# Patient Record
Sex: Female | Born: 1935
Health system: Southern US, Community
[De-identification: ages and names within clinical notes are randomized; demographics above are authoritative.]

## PROBLEM LIST (undated history)

## (undated) DIAGNOSIS — I10 Essential (primary) hypertension: Secondary | ICD-10-CM

## (undated) DIAGNOSIS — K219 Gastro-esophageal reflux disease without esophagitis: Secondary | ICD-10-CM

## (undated) DIAGNOSIS — E785 Hyperlipidemia, unspecified: Secondary | ICD-10-CM

## (undated) DIAGNOSIS — K449 Diaphragmatic hernia without obstruction or gangrene: Secondary | ICD-10-CM

## (undated) DIAGNOSIS — M1990 Unspecified osteoarthritis, unspecified site: Secondary | ICD-10-CM

## (undated) DIAGNOSIS — Z8719 Personal history of other diseases of the digestive system: Secondary | ICD-10-CM

## (undated) DIAGNOSIS — D649 Anemia, unspecified: Secondary | ICD-10-CM

## (undated) DIAGNOSIS — C801 Malignant (primary) neoplasm, unspecified: Secondary | ICD-10-CM

## (undated) DIAGNOSIS — Z9889 Other specified postprocedural states: Secondary | ICD-10-CM

## (undated) DIAGNOSIS — R413 Other amnesia: Secondary | ICD-10-CM

## (undated) HISTORY — PX: COLONOSCOPY: SHX174

## (undated) HISTORY — DX: Anemia, unspecified: D64.9

## (undated) HISTORY — DX: Unspecified osteoarthritis, unspecified site: M19.90

## (undated) HISTORY — DX: Gastro-esophageal reflux disease without esophagitis: K21.9

## (undated) HISTORY — DX: Personal history of other diseases of the digestive system: Z87.19

## (undated) HISTORY — DX: Essential (primary) hypertension: I10

## (undated) HISTORY — DX: Diaphragmatic hernia without obstruction or gangrene: K44.9

## (undated) HISTORY — DX: Hyperlipidemia, unspecified: E78.5

## (undated) HISTORY — DX: Other specified postprocedural states: Z98.890

---

## 1998-11-17 ENCOUNTER — Emergency Department (HOSPITAL_COMMUNITY): Admission: EM | Admit: 1998-11-17 | Discharge: 1998-11-17 | Payer: Self-pay

## 2005-06-30 ENCOUNTER — Inpatient Hospital Stay (HOSPITAL_COMMUNITY): Admission: AD | Admit: 2005-06-30 | Discharge: 2005-07-03 | Payer: Self-pay | Admitting: Internal Medicine

## 2005-07-02 ENCOUNTER — Encounter (INDEPENDENT_AMBULATORY_CARE_PROVIDER_SITE_OTHER): Payer: Self-pay | Admitting: *Deleted

## 2008-12-23 ENCOUNTER — Emergency Department (HOSPITAL_COMMUNITY): Admission: EM | Admit: 2008-12-23 | Discharge: 2008-12-23 | Payer: Self-pay | Admitting: Emergency Medicine

## 2009-12-11 ENCOUNTER — Encounter: Admission: RE | Admit: 2009-12-11 | Discharge: 2009-12-11 | Payer: Self-pay | Admitting: Family Medicine

## 2011-04-09 NOTE — H&P (Signed)
NAME:  [name], [name]                   ACCOUNT NO.:  [id number]   MEDICAL RECORD NO.:  [id number]          PATIENT TYPE:  INP   LOCATION:  6739                         FACILITY:  [facility]   PHYSICIAN:  Jackie Plum, M.D.DATE OF BIRTH:  08-Dec-1931   DATE OF ADMISSION:  06/30/2005  DATE OF DISCHARGE:                                HISTORY & PHYSICAL   PRIMARY CARE PHYSICIAN:  Tally Joe, M.D. of [facility].   CHIEF COMPLAINT:  Hematochezia.   HISTORY OF PRESENT ILLNESS:  The patient is a 77 year old African American  lady with a history of hypertension and dyslipidemia on Maxzide 37.5/25 one  tablet daily, aspirin 81 mg daily, Lipitor 40 mg daily.  She went to her  primary care physician's office today because of hematochezia.  According to  the patient, she had been in her usual state of health until three days ago,  Monday, June 28, 2005, when she noted that she was constipated.  She took  an unknown laxative and started to open her bowels some the next day.  Around 5 this morning, she woke up with some cramping, low abdominal pain,  and she went to the bathroom to ease herself and that is when she realized  that she had some bloody diarrhea without any mucus.  She said that she had  more than about 50 to 60 cc of bright red blood mixed with her liquids  diarrheal stools.  This recurred on three occasions and therefore, she came  to see her PCP, Dr. Azucena Cecil in the office.  According Dr. Azucena Cecil, who I spoke  to by the phone, he performed anoscopy, which did not reveal any hemorrhoids  and it showed a pool of clotted blood.  Her blood pressure, according to Dr.  Azucena Cecil, was stable without any hypotension or tachycardia and apparently no  blood work was obtained.  The patient was referred to the hospitalist  service for admission and management.  The patient admits to lightheadedness  earlier which is better now without any chest pain or shortness of breath,  sputum  production, cough, fever, chills, PND, orthopnea, palpitations,  visual changes.  She denies any dysuria or frequency of micturition.  She  does not have any abdominal pain at this moment.  She has not had any  diarrheal episodes or any episode of hematochezia since leaving her house to  go to the doctor's office today.  No fever, no chills, no heat or cold  intolerance, no weight loss or weight gain.   PAST MEDICAL HISTORY:  Please see HPI above.  The patient denies any history  of diabetes or heart disease.   MEDICATION HISTORY:  Please see HPI above.   ALLERGIES:  The patient denies any known medication allergies.   FAMILY HISTORY:  Positive for heart disease in her brother who had a heart  attack, age of the brother at the time of the heart attack is unclear.  No  history of bowel cancers or bowel illnesses in the family is noted.   SOCIAL HISTORY:  The patient lives with  her husband.  She does not smoke  cigarettes, drinks alcohol occasionally according to her on a social basis  only.   REVIEW OF SYSTEMS:  Ten-step systemic inquiry review is as stated under HPI,  otherwise  unremarkable.   PHYSICAL EXAMINATION:  VITAL SIGNS:  As yet to be taken, it will be  reviewed.  GENERAL:  She is not in any acute distress.  HEENT:  Normocephalic, atraumatic.  Pupils are equal, round, and reactive to  light.  Extraocular movements intact.  Oropharynx moist.  No exudation or  erythema.  NECK:  Supple.  No JVD.  LUNGS:  Clear to auscultation.  CARDIAC:  Regular rate and rhythm.  No gallops or murmur.  ABDOMEN:  Mild to moderately obese.  Soft, nontender.  Bowel sounds present.  EXTREMITIES:  No cyanosis and no edema.  CNS:  Nonfocal.   The patient came with blood work done on January 10, 2002, which was  reviewed.  No recent blood work came with her.  She also came with complete  physical exam  done on January 17, 2002.  This was also reviewed.   IMPRESSION:  1.  Lower  gastrointestinal bleed.  2.  Hypertension.  3.  Dyslipidemia.   PLAN:  1.  The patient will be admitted.  2.  We will run a panel of CBC, complete metabolic panel, coagulation panel,      x-rays, and a 12-lead EKG for completeness sake.  3.  We will consult GI.  The patient has never had any colonoscopy before      for possible inpatient colonoscopy.  However, if on observation she does      not have any evidence of acute bleed and her hematocrit has not dropped      significantly, this may be done at the outpatient level.       GO/MEDQ  D:  06/30/2005  T:  06/30/2005  Job:  [id number]   cc:   Tally Joe, M.D.  Fax: [phone]   Bernette Redbird, M.D.  [address]  [place], [place] [address]  Fax: [phone]

## 2011-04-09 NOTE — Op Note (Signed)
NAME:  [name], [name]                   ACCOUNT NO.:  [id number]   MEDICAL RECORD NO.:  [id number]          PATIENT TYPE:  INP   LOCATION:  6739                         FACILITY:  [facility]   PHYSICIAN:  Bernette Redbird, M.D.   DATE OF BIRTH:  30-Dec-1931   DATE OF PROCEDURE:  07/02/2005  DATE OF DISCHARGE:                                 OPERATIVE REPORT   PROCEDURE:  Upper endoscopy with biopsies.   INDICATIONS:  A 77 year old female who presented to the hospital with  burgundy stools that became melenic in character, hemoglobin around 9,  without frank syncope, in the setting of outpatient aspirin in [facility] exposure.   FINDINGS:  Small ulcer plus erosions in the antrum of the stomach.   PROCEDURE:  The nature, purpose, risks and alternatives of the procedure  have been discussed with the patient who provided written consent and was  brought in a fasted state from her hospital room to the endoscopy unit where  topical pharyngeal anesthesia was followed by intravenous sedation with  fentanyl 35 mcg and Versed 5 milligrams IV. She developed some sinus  tachycardia around 120 while she was coughing during the procedure and  during that time her O2 sat did drop to about 79%. This may have been just  due to the severe coughing or may have been spurious but in any event, after  repositioning her head, stopping suctioning and thereby reducing coughing  and gagging, the O2 sat promptly rose back to 96%.   The Olympus small-caliber adult video endoscope was used for this procedure  was passed under direct vision. The larynx was briefly seen and appeared  grossly normal although a detailed examination was not achieved. The  esophagus had normal mucosa but a well-defined esophageal ring at the  squamocolumnar junction, below which was a roughly 2-3 cm hiatal hernia. The  esophagus showed no evidence of Mallory-Weiss tear, reflux esophagitis,  Barrett's esophagus, varices, infection or neoplasia.   The  stomach contained no blood or coffee-ground material. There was a small  clear residual that was suctioned up.   On the anterior wall of the antrum was a roughly 5-6 mm moderately deep  ulcer with a fairly clean base but an erythematous rim, and in the more  prepyloric region there were a couple of erosions. There was certainly no  visible vessel or adherent clot. The pylorus, duodenal bulb and second  duodenum looked normal.   Prior to removal of the scope, I obtained antral biopsies looking for  evidence of H. pylori infection. The patient tolerated the procedure well  and there were no apparent complications.   IMPRESSION:  1.  Gastric ulcer accounting for the patient's recent gastrointestinal      hemorrhage (531.00).  2.  No evidence of active bleeding at the time of this exam.  3.  Small hiatal hernia with esophageal ring which is asymptomatic.  4.  Gastric erosions, with the overall picture suggestive of aspirin induced      gastropathy.   PLAN:  1.  The patient will have her diet advanced and  she will probably be okay      for discharge if her hemoglobin remains stable.  2.  Await pathology on biopsies. If H. pylori is present, I would favor      treating it.  3.  The patient should remain off aspirin for a month, at which time she may      resume it as [name] as she remains on daily proton pump inhibitor therapy  4.  The patient should have follow-up endoscopy in two months to confirm      ulcer healing and at that same time, a screening colonoscopy could be      performed.  5.  The patient should follow-up with her primary physician in the office      the next week or so, at which time a stool Hemoccult, CBC, and clinical      assessment could be obtained and she could be started on iron      supplementation to help rebuild her blood count.       RB/MEDQ  D:  07/02/2005  T:  07/03/2005  Job:  [id number]   cc:   Tally Joe, M.D.  Fax: [phone]

## 2011-04-09 NOTE — Consult Note (Signed)
NAME:  [name], [name]                   ACCOUNT NO.:  [id number]   MEDICAL RECORD NO.:  [id number]          PATIENT TYPE:  INP   LOCATION:  6739                         FACILITY:  [facility]   PHYSICIAN:  Bernette Redbird, M.D.   DATE OF BIRTH:  28-Nov-1931   DATE OF CONSULTATION:  07/02/2005  DATE OF DISCHARGE:                                   CONSULTATION   REFERRING PHYSICIAN:  [name], M.D.   Dr. Derenda Mis of the [facility]ists asked [place] to see this 77-year-  old female because of GI bleeding.   Ms. Schmaltz was admitted to the hospital two days ago with a history of  burgundy stools that have subsequently become darker, in association with  daily use of an 81 milligrams aspirin plus an average of one BC powder  daily. No prior history of ulcers or GI bleeding. She did have some  prodromal dyspeptic heartburn-type symptoms for which she uses Tums. No  syncope, perhaps some weakness or dizziness.   Her hemoglobin since admission has held up fairly well and she has not  required a transfusion. Her current hemoglobin of 9.2 has really been stable  for the past couple of days. Her BUN was normal at the time of admission.   With that background, I was asked to see the patient.   PAST MEDICAL HISTORY:  As obtained from the old chart, no known allergies.   OUTPATIENT MEDICATIONS:  Maxzide, Lipitor, Mucinex, daily 81 milligrams  aspirin, and BC powders.   OPERATIONS:  None.   HABITS:  Occasional ethanol, nonsmoker.   FAMILY HISTORY:  Negative for GI illnesses.   SOCIAL HISTORY:  Lives with her husband.   REVIEW OF SYSTEMS:  Negative for chronic GI symptoms other than some degree  of heartburn; specifically no dysphagia, anorexia, weight loss, abdominal  pain, nausea, vomiting, constipation, or diarrhea.   PHYSICAL EXAMINATION:  GENERAL:  A pleasant African-American female in no  evident distress; neither anxious nor depressed.  HEENT:  Anicteric. No overt pallor.  Oropharynx benign.  CHEST:  Clear.  HEART:  Normal.  ABDOMEN:  Without organomegaly, guarding, mass or tenderness.   LABORATORY DATA:  Admission hemoglobin was 9.1 with an MCV of 72 and a  slightly elevated RDW of 14.8, platelets 196,000. INR 1.1, BUN was 11 on  admission. Liver chemistries normal. Albumin 3.2.   IMPRESSION:  Recent gastrointestinal bleed, probably of upper tract origin:  In view of her risk factors and the fact that her stool became melenic in  character, the normal BUN on admission raises the question as to whether the  bleeding may have stopped prior to admission or whether it could be from a  lower tract source.   PLAN:  Endoscopic evaluation later today. The nature, purpose, risks,  alternatives were reviewed with the patient and she is agreeable to proceed,  with further management to depend on the endoscopic findings.       RB/MEDQ  D:  07/02/2005  T:  07/03/2005  Job:  [id number]   cc:   Tally Joe, M.D.  Fax: [phone]

## 2011-04-09 NOTE — Discharge Summary (Signed)
NAME:  [name], [name]                   ACCOUNT NO.:  [id number]   MEDICAL RECORD NO.:  [id number]          PATIENT TYPE:  INP   LOCATION:  6739                         FACILITY:  [facility]   PHYSICIAN:  [name] L. Ladona Ridgel, MD  DATE OF BIRTH:  06/08/1932   DATE OF ADMISSION:  06/30/2005  DATE OF DISCHARGE:  07/03/2005                                 DISCHARGE SUMMARY   DISCHARGE DIAGNOSES:  1.  Melena. The patient was admitted to the hospital and underwent      esophagogastroduodenoscopy where she was found to have a gastric ulcer,      nonbleeding. She was treated with proton pump inhibitor which should be      continued at home. She will follow up as an outpatient with the [facility] GI      for repeat esophagogastroduodenoscopy in several weeks as well as I      colonoscopy.  2.  Hypertension.  She will continue on her triamterene/hydrochlorothiazide.  3.  Dyslipidemia. She will continue on her Lipitor.  4.  Chronic cough. The patient has been taking Mucinex at home that she      started on her own.  The  symptomatology and exam are consistent with      probable reflux and sinusitis. I have requested that she follow up with      Dr. Azucena Cecil if the symptoms persist.   MEDICATIONS AT THE TIME OF DISCHARGE:  1.  Lipitor 40 mg p.o. q.h.s.  2.  Triamterene/hydrochlorothiazide 1 tablet p.o. daily.  I assume this is      37.5/25.  3.  Guaifenesin 600 mg b.i.d.  4.  Protonix 40 mg daily.   The patient was encouraged to avoid the [facility] Powders, aspirin, Motrin, Aleve,  anything in that related family and she was to use Tylenol for pain.  She  was instructed verbally on the diet that she should maintain namely avoiding  alcohol, caffeine, and spicy foods until she sees Dr. Matthias Hughs again.   Follow up with Dr. Mariann Laster at 1:00 p.m.Marland Kitchen She is to follow up  with Dr. Azucena Cecil next week to check her blood levels.   HISTORY OF PRESENT ILLNESS:  The patient is a very pleasant 77 year old  African-American female who presented to the emergency room after being seen  in her primary care physician's office with a complaint of hematochezia.  According to the patient she was in her usual state of health until  August7,2006 when she noticed some constipation. She took a laxative and the  next morning around 5:00 a.m. was awoken with crampy abdominal pain. She  went to bathroom and realize that the diarrhea with bloody with some mucus.  She said she had about 50-60 mL of bright red blood mixed with her stool.  She said this recurred a couple of times and so she went to Dr. Merita Norton  office who sent her to the emergency room. In the ED, her blood pressure was  stable. She was not tachycardiac and her hemoglobin/hematocrit were stable  at 9.1 and 28.1 respectively. The patient was  admitted to the telemetry  floor. She it was evaluated with serial H&H's for a further decrease in her  hemoglobin. The patient was then evaluated by GI who recommended to go in  for a EGD. The patient had been prepared the night before for potential EGD  and therefore was taken later in the day on Friday. The EGD showed a 5 mm  ulcer in the gastric body without bleeding. The patient was return to her  room, continued on a proton pump inhibitor, and monitored overnight. During  the course of hospital stay she had maybe one to two other bowel movements  that were more melanotic than hematochezia. She tolerated p.o. diet and  therefore with hemodynamic instability and no change in her hemoglobin and  hematocrit she was discharged to home on Saturday, August12.   PERTINENT LABORATORY VALUES:  During the course of hospital stay on the day  of discharge her sodium was 140, potassium of 3.2 and was repleted orally,  her glucose was 101, BUN was 9, creatinine was 0.6. Her CBC showed white  count of 6.6, hemoglobin of 8.9 and 28 which was not much different from the  9.2 and 28.8 of the day before.  It was  therefore recommended that she  follow up with Dr. Azucena Cecil for further hemoglobin and have  hematocrit  checked this week. Other pertinent labs, her PTT was 29. PT was 1.1.  Urinalysis was negative. Portable chest x-ray was completed on August9,which  showed no acute disease.   On the day of discharge the patient was deemed hemodynamically stable for  follow-up with Dr. Azucena Cecil who should evaluate her hemoglobin and hematocrit.  She was given appropriate orders for waiting [facility] Powders, aspirin and  medications that would affect her abdomen and she will follow up with Dr.  Matthias Hughs on [place].   CONDITION AT THE TIME OF DISCHARGE:  Stable.      [name] L. Ladona Ridgel, MD  Electronically Signed     MLT/MEDQ  D:  07/05/2005  T:  07/05/2005  Job:  [id number]   cc:   Bernette Redbird, M.D.  [address] [place]  [place], [place] [address]  Fax: [phone]   Tally Joe, M.D.  Fax: [phone]

## 2011-07-28 ENCOUNTER — Emergency Department (HOSPITAL_COMMUNITY): Payer: Medicare Other

## 2011-07-28 ENCOUNTER — Emergency Department (HOSPITAL_COMMUNITY)
Admission: EM | Admit: 2011-07-28 | Discharge: 2011-07-29 | Disposition: A | Discharge status: Home / Self Care | Payer: Medicare Other | Attending: Emergency Medicine | Admitting: Emergency Medicine

## 2011-07-28 DIAGNOSIS — E789 Disorder of lipoprotein metabolism, unspecified: Secondary | ICD-10-CM | POA: Insufficient documentation

## 2011-07-28 DIAGNOSIS — M25469 Effusion, unspecified knee: Secondary | ICD-10-CM | POA: Insufficient documentation

## 2011-07-28 DIAGNOSIS — K219 Gastro-esophageal reflux disease without esophagitis: Secondary | ICD-10-CM | POA: Insufficient documentation

## 2011-07-28 DIAGNOSIS — M25569 Pain in unspecified knee: Secondary | ICD-10-CM | POA: Insufficient documentation

## 2011-07-28 DIAGNOSIS — I10 Essential (primary) hypertension: Secondary | ICD-10-CM | POA: Insufficient documentation

## 2011-09-20 ENCOUNTER — Encounter (INDEPENDENT_AMBULATORY_CARE_PROVIDER_SITE_OTHER): Payer: Self-pay | Admitting: General Surgery

## 2011-10-04 ENCOUNTER — Ambulatory Visit (INDEPENDENT_AMBULATORY_CARE_PROVIDER_SITE_OTHER): Payer: Medicare Other | Admitting: General Surgery

## 2011-10-04 ENCOUNTER — Encounter (INDEPENDENT_AMBULATORY_CARE_PROVIDER_SITE_OTHER): Payer: Self-pay | Admitting: General Surgery

## 2011-10-04 VITALS — BP 144/78 | HR 60 | Temp 97.8°F | Resp 18 | Ht 63.0 in | Wt 140.1 lb

## 2011-10-04 DIAGNOSIS — K4020 Bilateral inguinal hernia, without obstruction or gangrene, not specified as recurrent: Secondary | ICD-10-CM

## 2011-10-04 NOTE — Patient Instructions (Signed)
You have bilateral inguinal hernias. You have decided to proceed with scheduling of repair of the bilateral inguinal hernias, and we are going to try to do this laparoscopically. We will schedule the surgery in the near future at your convenience.  Hernia Repair with Laparoscope A hernia occurs when an internal organ pushes out through a weak spot in the belly (abdominal) wall muscles. Hernias most commonly occur in the groin and around the navel. Hernias can also occur through a cut by the surgeon (incision) after an abdominal operation. A hernia may be caused by:  Lifting heavy objects.   Prolonged coughing.   Straining to move your bowels.  Hernias can often be pushed back into place (reduced). Most hernias tend to get worse over time. Problems occur when abdominal contents get stuck in the opening and the blood supply is blocked or impaired (incarcerated hernia). Because of these risks, you require surgery to repair the hernia. Your hernia will be repaired using a laparoscope. Laparoscopic surgery is a type of minimally invasive surgery. It does not involve making a typical surgical cut (incision) in the skin. A laparoscope is a telescope-like rod and lens system. It is usually connected to a video camera and a light source so your caregiver can clearly see the operative area. The instruments are inserted through  to  inch (5 mm or 10 mm) openings in the skin at specific locations. A working and viewing space is created by blowing a small amount of carbon dioxide gas into the abdominal cavity. The abdomen is essentially blown up like a balloon (insufflated). This elevates the abdominal wall above the internal organs like a dome. The carbon dioxide gas is common to the human body and can be absorbed by tissue and removed by the respiratory system. Once the repair is completed, the small incisions will be closed with either stitches (sutures) or staples (just like a paper stapler only this staple  holds the skin together). LET YOUR CAREGIVERS KNOW ABOUT:  Allergies.   Medications taken including herbs, eye drops, over the counter medications, and creams.   Use of steroids (by mouth or creams).   Previous problems with anesthetics or Novocaine.   Possibility of pregnancy, if this applies.   History of blood clots (thrombophlebitis).   History of bleeding or blood problems.   Previous surgery.   Other health problems.  BEFORE THE PROCEDURE  Laparoscopy can be done either in a hospital or out-patient clinic. You may be given a mild sedative to help you relax before the procedure. Once in the operating room, you will be given a general anesthesia to make you sleep (unless you and your caregiver choose a different anesthetic).  AFTER THE PROCEDURE  After the procedure you will be watched in a recovery area. Depending on what type of hernia was repaired, you might be admitted to the hospital or you might go home the same day. With this procedure you may have less pain and scarring. This usually results in a quicker recovery and less risk of infection. HOME CARE INSTRUCTIONS   Bed rest is not required. You may continue your normal activities but avoid heavy lifting (more than 10 pounds) or straining.   Cough gently. If you are a smoker it is best to stop, as even the best hernia repair can break down with the continual strain of coughing.   Avoid driving until given the OK by your surgeon.   There are no dietary restrictions unless given otherwise.  TAKE ALL MEDICATIONS AS DIRECTED.   Only take over-the-counter or prescription medicines for pain, discomfort, or fever as directed by your caregiver.  SEEK MEDICAL CARE IF:   There is increasing abdominal pain or pain in your incisions.   There is more bleeding from incisions, other than minimal spotting.   You feel light headed or faint.   You develop an unexplained fever, chills, and/or an oral temperature above 102 F  (38.9 C).   You have redness, swelling, or increasing pain in the wound.   Pus coming from wound.   A foul smell coming from the wound or dressings.  SEEK IMMEDIATE MEDICAL CARE IF:   You develop a rash.   You have difficulty breathing.   You have any allergic problems.  MAKE SURE YOU:   Understand these instructions.   Will watch your condition.   Will get help right away if you are not doing well or get worse.  Document Released: 11/08/2005 Document Revised: 07/21/2011 Document Reviewed: 10/08/2009 [facility] Patient Information 2012 [place], [place].  Inguinal Hernia, Adult Muscles help keep everything in the body in its proper place. But if a weak spot in the muscles develops, something can poke through. That is called a hernia. When this happens in the lower part of the belly (abdomen), it is called an inguinal hernia. (It takes its name from a part of the body in this region called the inguinal canal.) A weak spot in the wall of muscles lets some fat or part of the small intestine bulge through. An inguinal hernia can develop at any age. Men get them more often than women. CAUSES  In adults, an inguinal hernia develops over time.  It can be triggered by:   Suddenly straining the muscles of the lower abdomen.   Lifting heavy objects.   Straining to have a bowel movement. Difficult bowel movements (constipation) can lead to this.   Constant coughing. This may be caused by smoking or lung disease.   Being overweight.   Being pregnant.   Working at a job that requires [place] periods of standing or heavy lifting.   Having had an inguinal hernia before.  One type can be an emergency situation. It is called a strangulated inguinal hernia. It develops if part of the small intestine slips through the weak spot and cannot get back into the abdomen. The blood supply can be cut off. If that happens, part of the intestine may die. This situation requires emergency  surgery. SYMPTOMS  Often, a small inguinal hernia has no symptoms. It is found when a healthcare provider does a physical exam. Larger hernias usually have symptoms.   In adults, symptoms may include:   A lump in the groin. This is easier to see when the person is standing. It might disappear when lying down.   In men, a lump in the scrotum.   Pain or burning in the groin. This occurs especially when lifting, straining or coughing.   A dull ache or feeling of pressure in the groin.   Signs of a strangulated hernia can include:   A bulge in the groin that becomes very painful and tender to the touch.   A bulge that turns red or purple.   Fever, nausea and vomiting.   Inability to have a bowel movement or to pass gas.  DIAGNOSIS  To decide if you have an inguinal hernia, a healthcare provider will probably do a physical examination.  This will include asking questions about  any symptoms you have noticed.   The healthcare provider might feel the groin area and ask you to cough. If an inguinal hernia is felt, the healthcare provider may try to slide it back into the abdomen.   Usually no other tests are needed.  TREATMENT  Treatments can vary. The size of the hernia makes a difference. Options include:  Watchful waiting. This is often suggested if the hernia is small and you have had no symptoms.   No medical procedure will be done unless symptoms develop.   You will need to watch closely for symptoms. If any occur, contact your healthcare provider right away.   Surgery. This is used if the hernia is larger or you have symptoms.   Open surgery. This is usually an outpatient procedure (you will not stay overnight in a hospital). An cut (incision) is made through the skin in the groin. The hernia is put back inside the abdomen. The weak area in the muscles is then repaired by herniorrhaphy or hernioplasty. Herniorrhaphy: in this type of surgery, the weak muscles are sewn back  together. Hernioplasty: a patch or mesh is used to close the weak area in the abdominal wall.   Laparoscopy. In this procedure, a surgeon makes small incisions. A thin tube with a tiny video camera (called a laparoscope) is put into the abdomen. The surgeon repairs the hernia with mesh by looking with the video camera and using two Mannings instruments.  HOME CARE INSTRUCTIONS   After surgery to repair an inguinal hernia:   You will need to take pain medicine prescribed by your healthcare provider. Follow all directions carefully.   You will need to take care of the wound from the incision.   Your activity will be restricted for awhile. This will probably include no heavy lifting for several weeks. You also should not do anything too active for a few weeks. When you can return to work will depend on the type of job that you have.   During "watchful waiting" periods, you should:   Maintain a healthy weight.   Eat a diet high in fiber (fruits, vegetables and whole grains).   Drink plenty of fluids to avoid constipation. This means drinking enough water and other liquids to keep your urine clear or pale yellow.   Do not lift heavy objects.   Do not stand for [place] periods of time.   Quit smoking. This should keep you from developing a frequent cough.  SEEK MEDICAL CARE IF:   A bulge develops in your groin area.   You feel pain, a burning sensation or pressure in the groin. This might be worse if you are lifting or straining.   You develop a fever of more than 100.5 F (38.1 C).  SEEK IMMEDIATE MEDICAL CARE IF:   Pain in the groin increases suddenly.   A bulge in the groin gets bigger suddenly and does not go down.   For men, there is sudden pain in the scrotum. Or, the size of the scrotum increases.   A bulge in the groin area becomes red or purple and is painful to touch.   You have nausea or vomiting that does not go away.   You feel your heart beating much faster than normal.    You cannot have a bowel movement or pass gas.   You develop a fever of more than 102.0 F (38.9 C).  Document Released: 03/27/2009 Document Revised: 07/21/2011 Document Reviewed: 03/27/2009 [employer] Patient Information 2012  [employer].

## 2011-10-04 NOTE — Progress Notes (Signed)
Chief Complaint  Patient presents with  . New Evaluation    eval of LIH     HPI [name] is a 77 y.o. female.    This pleasant woman was referred to me by Dr. Tally Joe for evaluation of a symptomatic left inguinal hernia. She also wonders if she has a little bulge on the right side.  She is fairly healthy. She has hypertension which is well-controlled, hyperlipidemia, well-controlled reflux , and iron deficiency anemia. She has never had any surgery at any time.  She's noticed a bulge in her left groin for one to 2 years. It is slightly larger and she's having some pain. She saw Dr. Azucena Cecil in the office and he was able to diagnose a left inguinal hernia. HPI  Past Medical History  Diagnosis Date  . Hyperlipidemia   . Hypertension   . GERD (gastroesophageal reflux disease)   . Anemia     Past Surgical History  Procedure Date  . Colonoscopy     History reviewed. No pertinent family history.  Social History History  Substance Use Topics  . Smoking status: Never Smoker   . Smokeless tobacco: Never Used  . Alcohol Use: No    No Known Allergies  Current Outpatient Prescriptions  Medication Sig Dispense Refill  . atorvastatin (LIPITOR) 80 MG tablet Take 80 mg by mouth daily.        Marland Kitchen ezetimibe (ZETIA) 10 MG tablet Take 10 mg by mouth daily.        Marland Kitchen triamterene-hydrochlorothiazide (MAXZIDE-25) 37.5-25 MG per tablet Take 1 tablet by mouth daily.          Review of Systems Review of Systems  Constitutional: Negative for fever, chills and unexpected weight change.  HENT: Negative for hearing loss, congestion, sore throat, trouble swallowing and voice change.   Eyes: Negative for visual disturbance.  Respiratory: Negative for cough and wheezing.   Cardiovascular: Negative for chest pain, palpitations and leg swelling.  Gastrointestinal: Negative for nausea, vomiting, abdominal pain, diarrhea, constipation, blood in stool, abdominal distention and anal bleeding.    Genitourinary: Negative for hematuria, vaginal bleeding and difficulty urinating.  Musculoskeletal: Negative for arthralgias.  Skin: Negative for rash and wound.  Neurological: Negative for seizures, syncope and headaches.  Hematological: Negative for adenopathy. Does not bruise/bleed easily.  Psychiatric/Behavioral: Negative for confusion.    Blood pressure 144/78, pulse 60, temperature 97.8 F (36.6 C), temperature source Temporal, resp. rate 18, height 5\' 3"  (1.6 m), weight 140 lb 2 oz (63.56 kg).  Physical Exam Physical Exam  Constitutional: She is oriented to person, place, and time. She appears well-developed and well-nourished. No distress.  HENT:  Head: Normocephalic and atraumatic.  Nose: Nose normal.  Mouth/Throat: No oropharyngeal exudate.  Eyes: Conjunctivae and EOM are normal. Pupils are equal, round, and reactive to light. Left eye exhibits no discharge. No scleral icterus.  Neck: Neck supple. No JVD present. No tracheal deviation present. Thyromegaly present.       Small, soft, diffuse goiter.  Cardiovascular: Normal rate, regular rhythm, normal heart sounds and intact distal pulses.   No murmur heard. Pulmonary/Chest: Effort normal and breath sounds normal. No respiratory distress. She has no wheezes. She has no rales. She exhibits no tenderness.  Abdominal: Soft. Bowel sounds are normal. She exhibits no distension and no mass. There is no tenderness. There is no rebound and no guarding.  Genitourinary:       Bilateral inguinal hernias,medium-sized,easily reducible. No adenopathy. Umbilicus is normal.  Musculoskeletal: She exhibits no edema and no tenderness.  Lymphadenopathy:    She has no cervical adenopathy.  Neurological: She is alert and oriented to person, place, and time. She exhibits normal muscle tone. Coordination normal.  Skin: Skin is warm. No rash noted. She is not diaphoretic. No erythema. No pallor.  Psychiatric: She has a normal mood and affect. Her  behavior is normal. Judgment and thought content normal.    Data Reviewed I reviewed Dr. Genoveva Ill office notes.  Assessment    Bilateral inguinal hernias, symptomatic on the left.  Hypertension, well controlled  Hyperlipidemia  Mild gastroesophageal reflux disease.  History of iron deficiency anemia.  Question small goiter. Asymptomatic.    Plan    After lengthy discussion regarding the anatomy of her hernias, and different techniques for repair, she decided she would like to be scheduled for laparoscopic repair of her bilateral inguinal hernias with mesh.  She will be scheduled for laparoscopic repair of bilateral inguinal hernias with mesh, possible open in the near future.  I discussed the indications and details of the surgery with her. Risks and complications have been outlined, including but limited to bleeding, infection, conversion to open surgery, recurrence of the hernia, injury to adjacent organs such as the intestine or bladder with major reconstructive surgery, nerve damage with chronic pain or numbness, cardiac pulmonary and thromboembolic problems. She understands these issues well. She is in full agreement with this plan. All of her questions are answered.       Michole Lecuyer M 10/04/2011, 9:19 AM

## 2011-10-06 ENCOUNTER — Encounter (HOSPITAL_COMMUNITY): Payer: Self-pay

## 2011-10-07 ENCOUNTER — Encounter (HOSPITAL_COMMUNITY): Payer: Self-pay

## 2011-10-07 ENCOUNTER — Ambulatory Visit (HOSPITAL_COMMUNITY)
Admission: RE | Admit: 2011-10-07 | Discharge: 2011-10-07 | Disposition: A | Discharge status: Home / Self Care | Payer: Medicare Other | Source: Ambulatory Visit | Attending: General Surgery | Admitting: General Surgery

## 2011-10-07 ENCOUNTER — Encounter (HOSPITAL_COMMUNITY): Payer: Medicare Other

## 2011-10-07 ENCOUNTER — Other Ambulatory Visit: Payer: Self-pay

## 2011-10-07 DIAGNOSIS — Z01812 Encounter for preprocedural laboratory examination: Secondary | ICD-10-CM | POA: Insufficient documentation

## 2011-10-07 DIAGNOSIS — Z01810 Encounter for preprocedural cardiovascular examination: Secondary | ICD-10-CM | POA: Insufficient documentation

## 2011-10-07 DIAGNOSIS — J4489 Other specified chronic obstructive pulmonary disease: Secondary | ICD-10-CM | POA: Insufficient documentation

## 2011-10-07 DIAGNOSIS — K4020 Bilateral inguinal hernia, without obstruction or gangrene, not specified as recurrent: Secondary | ICD-10-CM | POA: Insufficient documentation

## 2011-10-07 DIAGNOSIS — J984 Other disorders of lung: Secondary | ICD-10-CM | POA: Insufficient documentation

## 2011-10-07 DIAGNOSIS — J449 Chronic obstructive pulmonary disease, unspecified: Secondary | ICD-10-CM | POA: Insufficient documentation

## 2011-10-07 DIAGNOSIS — I10 Essential (primary) hypertension: Secondary | ICD-10-CM | POA: Insufficient documentation

## 2011-10-07 LAB — CBC
HCT: 36.8 % (ref 36.0–46.0)
MCV: 72.2 fL — ABNORMAL LOW (ref 78.0–100.0)
RBC: 5.1 MIL/uL (ref 3.87–5.11)
WBC: 5 10*3/uL (ref 4.0–10.5)

## 2011-10-07 LAB — BASIC METABOLIC PANEL
BUN: 15 mg/dL (ref 6–23)
CO2: 29 mEq/L (ref 19–32)
Chloride: 101 mEq/L (ref 96–112)
Creatinine, Ser: 0.6 mg/dL (ref 0.50–1.10)

## 2011-10-07 NOTE — Patient Instructions (Addendum)
20 [name]  10/07/2011   Your procedure is scheduled on:  10/13/11 0830-1000 am   Report to [facility] at 0630 AM.  Call this number if you have problems the morning of surgery: [phone]   Remember:   Do not eat food:After Midnight.  Do not drink clear liquids: After Midnight.  Take these medicines the morning of surgery with A SIP OF WATER: none    Do not wear jewelry, make-up or nail polish.  Do not wear lotions, powders, or perfumes.    Do not shave 48 hours prior to surgery.  Do not bring valuables to the hospital.  Contacts, dentures or bridgework may not be worn into surgery.    Patients discharged the day of surgery will not be allowed to drive home.  Name and phone number of your driver: daughterDevery Odwyer  [phone]  Special Instructions: CHG Shower Use Special Wash: 1/2 bottle night before surgery and 1/2 bottle morning of surgery. Use chin to toes Wash face and private parts with regular soap.   Please read over the following fact sheets that you were given: MRSA Information, coughing and deep breathing exercises, leg exercises

## 2011-10-12 ENCOUNTER — Other Ambulatory Visit (INDEPENDENT_AMBULATORY_CARE_PROVIDER_SITE_OTHER): Payer: Self-pay | Admitting: General Surgery

## 2011-10-12 NOTE — H&P (Signed)
  [name]   10/04/2011 8:45 AM Office Visit  MRN: [id number]   Description: 77 year old female  Provider: Ethelyn Cerniglia M, MD  Department: [facility]        Diagnoses     Bilateral inguinal hernia   - Primary    550.92      Reason for Visit     New Evaluation    eval of LIH         Vitals - Last Recorded       BP Pulse Temp(Src) Resp Ht Wt    144/78  60  97.8 F (36.6 C) (Temporal)  18  5' 3" (1.6 m)  140 lb 2 oz (63.56 kg)          BMI              24.82 kg/m2                 Progress Notes     Jolin Benavides M, MD  10/04/2011  9:27 AM  SignedChief Complaint   Patient presents with   .  New Evaluation       eval of LIH       HPI [name] is a 77 y.o. female.     This pleasant woman was referred to me by Dr. David Swayne for evaluation of a symptomatic left inguinal hernia. She also wonders if she has a little bulge on the right side.   She is fairly healthy. She has hypertension which is well-controlled, hyperlipidemia, well-controlled reflux , and iron deficiency anemia. She has never had any surgery at any time.   She's noticed a bulge in her left groin for one to 2 years. It is slightly larger and she's having some pain. She saw Dr. Swayne in the office and he was able to diagnose a left inguinal hernia. HPI    Past Medical History   Diagnosis  Date   .  Hyperlipidemia     .  Hypertension     .  GERD (gastroesophageal reflux disease)     .  Anemia         Past Surgical History   Procedure  Date   .  Colonoscopy        History reviewed. No pertinent family history.   Social History History   Substance Use Topics   .  Smoking status:  Never Smoker    .  Smokeless tobacco:  Never Used   .  Alcohol Use:  No      No Known Allergies    Current Outpatient Prescriptions   Medication  Sig  Dispense  Refill   .  atorvastatin (LIPITOR) 80 MG tablet  Take 80 mg by mouth daily.           .  ezetimibe (ZETIA) 10 MG tablet   Take 10 mg by mouth daily.           .  triamterene-hydrochlorothiazide (MAXZIDE-25) 37.5-25 MG per tablet  Take 1 tablet by mouth daily.              Review of Systems Review of Systems  Constitutional: Negative for fever, chills and unexpected weight change.  HENT: Negative for hearing loss, congestion, sore throat, trouble swallowing and voice change.   Eyes: Negative for visual disturbance.  Respiratory: Negative for cough and wheezing.   Cardiovascular: Negative for chest pain, palpitations and leg swelling.  Gastrointestinal: Negative for nausea, vomiting, abdominal pain,   diarrhea, constipation, blood in stool, abdominal distention and anal bleeding.  Genitourinary: Negative for hematuria, vaginal bleeding and difficulty urinating.  Musculoskeletal: Negative for arthralgias.  Skin: Negative for rash and wound.  Neurological: Negative for seizures, syncope and headaches.  Hematological: Negative for adenopathy. Does not bruise/bleed easily.  Psychiatric/Behavioral: Negative for confusion.    Blood pressure 144/78, pulse 60, temperature 97.8 F (36.6 C), temperature source Temporal, resp. rate 18, height 5' 3" (1.6 m), weight 140 lb 2 oz (63.56 kg).   Physical Exam Physical Exam  Constitutional: She is oriented to person, place, and time. She appears well-developed and well-nourished. No distress.  HENT:   Head: Normocephalic and atraumatic.   Nose: Nose normal.   Mouth/Throat: No oropharyngeal exudate.  Eyes: Conjunctivae and EOM are normal. Pupils are equal, round, and reactive to light. Left eye exhibits no discharge. No scleral icterus.  Neck: Neck supple. No JVD present. No tracheal deviation present. Thyromegaly present.       Small, soft, diffuse goiter.  Cardiovascular: Normal rate, regular rhythm, normal heart sounds and intact distal pulses.    No murmur heard. Pulmonary/Chest: Effort normal and breath sounds normal. No respiratory distress. She has no wheezes. She  has no rales. She exhibits no tenderness.  Abdominal: Soft. Bowel sounds are normal. She exhibits no distension and no mass. There is no tenderness. There is no rebound and no guarding.  Genitourinary:       Bilateral inguinal hernias,medium-sized,easily reducible. No adenopathy. Umbilicus is normal.  Musculoskeletal: She exhibits no edema and no tenderness.  Lymphadenopathy:    She has no cervical adenopathy.  Neurological: She is alert and oriented to person, place, and time. She exhibits normal muscle tone. Coordination normal.  Skin: Skin is warm. No rash noted. She is not diaphoretic. No erythema. No pallor.  Psychiatric: She has a normal mood and affect. Her behavior is normal. Judgment and thought content normal.    Data Reviewed I reviewed Dr. David Swayne's office notes.   Assessment Bilateral inguinal hernias, symptomatic on the left.   Hypertension, well controlled   Hyperlipidemia   Mild gastroesophageal reflux disease.   History of iron deficiency anemia.   Question small goiter. Asymptomatic.   Plan After lengthy discussion regarding the anatomy of her hernias, and different techniques for repair, she decided she would like to be scheduled for laparoscopic repair of her bilateral inguinal hernias with mesh.   She will be scheduled for laparoscopic repair of bilateral inguinal hernias with mesh, possible open in the near future.   I discussed the indications and details of the surgery with her. Risks and complications have been outlined, including but limited to bleeding, infection, conversion to open surgery, recurrence of the hernia, injury to adjacent organs such as the intestine or bladder with major reconstructive surgery, nerve damage with chronic pain or numbness, cardiac pulmonary and thromboembolic problems. She understands these issues well. She is in full agreement with this plan. All of her questions are answered.       Kebrina Friend  M 10/04/2011, 9:19 AM                Not recorded      Pending        Disp Refills Start End    ceFAZolin (ANCEF) 1 g in dextrose 5 % 50 mL IVPB     10/04/2011      Route:  Intravenous    Class:  Normal              Patient Instructions     You have bilateral inguinal hernias. You have decided to proceed with scheduling of repair of the bilateral inguinal hernias, and we are going to try to do this laparoscopically. We will schedule the surgery in the near future at your convenience.   Hernia Repair with Laparoscope A hernia occurs when an internal organ pushes out through a weak spot in the belly (abdominal) wall muscles. Hernias most commonly occur in the groin and around the navel. Hernias can also occur through a cut by the surgeon (incision) after an abdominal operation. A hernia may be caused by: Lifting heavy objects.   Prolonged coughing.   Straining to move your bowels.  Hernias can often be pushed back into place (reduced). Most hernias tend to get worse over time. Problems occur when abdominal contents get stuck in the opening and the blood supply is blocked or impaired (incarcerated hernia). Because of these risks, you require surgery to repair the hernia. Your hernia will be repaired using a laparoscope. Laparoscopic surgery is a type of minimally invasive surgery. It does not involve making a typical surgical cut (incision) in the skin. A laparoscope is a telescope-like rod and lens system. It is usually connected to a video camera and a light source so your caregiver can clearly see the operative area. The instruments are inserted through  to  inch (5 mm or 10 mm) openings in the skin at specific locations. A working and viewing space is created by blowing a small amount of carbon dioxide gas into the abdominal cavity. The abdomen is essentially blown up like a balloon (insufflated). This elevates the abdominal wall above the internal organs like a dome. The carbon  dioxide gas is common to the human body and can be absorbed by tissue and removed by the respiratory system. Once the repair is completed, the small incisions will be closed with either stitches (sutures) or staples (just like a paper stapler only this staple holds the skin together). LET YOUR CAREGIVERS KNOW ABOUT: Allergies.   Medications taken including herbs, eye drops, over the counter medications, and creams.   Use of steroids (by mouth or creams).   Previous problems with anesthetics or Novocaine.   Possibility of pregnancy, if this applies.   History of blood clots (thrombophlebitis).   History of bleeding or blood problems.   Previous surgery.   Other health problems.  BEFORE THE PROCEDURE   Laparoscopy can be done either in a hospital or out-patient clinic. You may be given a mild sedative to help you relax before the procedure. Once in the operating room, you will be given a general anesthesia to make you sleep (unless you and your caregiver choose a different anesthetic).   AFTER THE PROCEDURE   After the procedure you will be watched in a recovery area. Depending on what type of hernia was repaired, you might be admitted to the hospital or you might go home the same day. With this procedure you may have less pain and scarring. This usually results in a quicker recovery and less risk of infection. HOME CARE INSTRUCTIONS   Bed rest is not required. You may continue your normal activities but avoid heavy lifting (more than 10 pounds) or straining.   Cough gently. If you are a smoker it is best to stop, as even the best hernia repair can break down with the continual strain of coughing.   Avoid driving until given the OK by your surgeon.   There are   no dietary restrictions unless given otherwise.   TAKE ALL MEDICATIONS AS DIRECTED.   Only take over-the-counter or prescription medicines for pain, discomfort, or fever as directed by your caregiver.  SEEK MEDICAL CARE IF:   There is  increasing abdominal pain or pain in your incisions.   There is more bleeding from incisions, other than minimal spotting.   You feel light headed or faint.   You develop an unexplained fever, chills, and/or an oral temperature above 102 F (38.9 C).   You have redness, swelling, or increasing pain in the wound.   Pus coming from wound.   A foul smell coming from the wound or dressings.  SEEK IMMEDIATE MEDICAL CARE IF:   You develop a rash.   You have difficulty breathing.   You have any allergic problems.  MAKE SURE YOU:   Understand these instructions.   Will watch your condition.   Will get help right away if you are not doing well or get worse.  Document Released: 11/08/2005 Document Revised: 07/21/2011 Document Reviewed: 10/08/2009 [employer] Patient Information 2012 [employer]   Inguinal Hernia, Adult  Muscles help keep everything in the body in its proper place. But if a weak spot in the muscles develops, something can poke through. That is called a hernia. When this happens in the lower part of the belly (abdomen), it is called an inguinal hernia. (It takes its name from a part of the body in this region called the inguinal canal.) A weak spot in the wall of muscles lets some fat or part of the small intestine bulge through. An inguinal hernia can develop at any age. Men get them more often than women. CAUSES   In adults, an inguinal hernia develops over time. It can be triggered by:   Suddenly straining the muscles of the lower abdomen.   Lifting heavy objects.   Straining to have a bowel movement. Difficult bowel movements (constipation) can lead to this.   Constant coughing. This may be caused by smoking or lung disease.   Being overweight.   Being pregnant.   Working at a job that requires [place] periods of standing or heavy lifting.   Having had an inguinal hernia before.  One type can be an emergency situation. It is called a strangulated inguinal hernia. It develops  if part of the small intestine slips through the weak spot and cannot get back into the abdomen. The blood supply can be cut off. If that happens, part of the intestine may die. This situation requires emergency surgery. SYMPTOMS   Often, a small inguinal hernia has no symptoms. It is found when a healthcare provider does a physical exam. Larger hernias usually have symptoms.   In adults, symptoms may include:   A lump in the groin. This is easier to see when the person is standing. It might disappear when lying down.   In men, a lump in the scrotum.   Pain or burning in the groin. This occurs especially when lifting, straining or coughing.   A dull ache or feeling of pressure in the groin.   Signs of a strangulated hernia can include:   A bulge in the groin that becomes very painful and tender to the touch.   A bulge that turns red or purple.   Fever, nausea and vomiting.   Inability to have a bowel movement or to pass gas.  DIAGNOSIS   To decide if you have an inguinal hernia, a healthcare provider will   probably do a physical examination. This will include asking questions about any symptoms you have noticed.   The healthcare provider might feel the groin area and ask you to cough. If an inguinal hernia is felt, the healthcare provider may try to slide it back into the abdomen.   Usually no other tests are needed.  TREATMENT   Treatments can vary. The size of the hernia makes a difference. Options include: Watchful waiting. This is often suggested if the hernia is small and you have had no symptoms.   No medical procedure will be done unless symptoms develop.   You will need to watch closely for symptoms. If any occur, contact your healthcare provider right away.   Surgery. This is used if the hernia is larger or you have symptoms.   Open surgery. This is usually an outpatient procedure (you will not stay overnight in a hospital). An cut (incision) is made through the skin in the groin. The  hernia is put back inside the abdomen. The weak area in the muscles is then repaired by herniorrhaphy or hernioplasty. Herniorrhaphy: in this type of surgery, the weak muscles are sewn back together. Hernioplasty: a patch or mesh is used to close the weak area in the abdominal wall.   Laparoscopy. In this procedure, a surgeon makes small incisions. A thin tube with a tiny video camera (called a laparoscope) is put into the abdomen. The surgeon repairs the hernia with mesh by looking with the video camera and using two Burdin instruments.  HOME CARE INSTRUCTIONS   After surgery to repair an inguinal hernia:   You will need to take pain medicine prescribed by your healthcare provider. Follow all directions carefully.   You will need to take care of the wound from the incision.   Your activity will be restricted for awhile. This will probably include no heavy lifting for several weeks. You also should not do anything too active for a few weeks. When you can return to work will depend on the type of job that you have.   During "watchful waiting" periods, you should:   Maintain a healthy weight.   Eat a diet high in fiber (fruits, vegetables and whole grains).   Drink plenty of fluids to avoid constipation. This means drinking enough water and other liquids to keep your urine clear or pale yellow.   Do not lift heavy objects.   Do not stand for Clipper periods of time.   Quit smoking. This should keep you from developing a frequent cough.  SEEK MEDICAL CARE IF:   A bulge develops in your groin area.   You feel pain, a burning sensation or pressure in the groin. This might be worse if you are lifting or straining.   You develop a fever of more than 100.5 F (38.1 C).  SEEK IMMEDIATE MEDICAL CARE IF:   Pain in the groin increases suddenly.   A bulge in the groin gets bigger suddenly and does not go down.   For men, there is sudden pain in the scrotum. Or, the size of the scrotum increases.   A bulge in  the groin area becomes red or purple and is painful to touch.   You have nausea or vomiting that does not go away.   You feel your heart beating much faster than normal.   You cannot have a bowel movement or pass gas.   You develop a fever of more than 102.0 F (38.9 C).  Document Released:   03/27/2009 Document Revised: 07/21/2011 Document Reviewed: 03/27/2009 [employer] Patient Information 2012 [employer].       Level of Service     PR OFFICE/OUTPT VISIT,NEW,LEVL III [99203]         All Flowsheet Templates (all recorded)     Encounter Vitals Flowsheet    Custom Formula Data Flowsheet    Anthropometrics Flowsheet                      All Charges for This Encounter       Code Description Service Date Service Provider Modifiers Quantity    99203 PR OFFICE/OUTPT VISIT,NEW,LEVL III 10/04/2011 Arya Luttrull M Larinda Herter, MD   1        Other Encounter Related Information     Allergies & Medications         Problem List         History         Patient-Entered Questionnaires     No data filed         

## 2011-10-13 ENCOUNTER — Encounter (HOSPITAL_COMMUNITY): Payer: Self-pay | Admitting: Anesthesiology

## 2011-10-13 ENCOUNTER — Ambulatory Visit (HOSPITAL_COMMUNITY): Payer: Medicare Other | Admitting: Anesthesiology

## 2011-10-13 ENCOUNTER — Encounter (HOSPITAL_COMMUNITY)
Admission: RE | Disposition: A | Discharge status: Home / Self Care | Payer: Self-pay | Source: Ambulatory Visit | Attending: General Surgery

## 2011-10-13 ENCOUNTER — Ambulatory Visit (HOSPITAL_COMMUNITY)
Admission: RE | Admit: 2011-10-13 | Discharge: 2011-10-13 | Disposition: A | Discharge status: Home / Self Care | Payer: Medicare Other | Source: Ambulatory Visit | Attending: General Surgery | Admitting: General Surgery

## 2011-10-13 ENCOUNTER — Encounter (HOSPITAL_COMMUNITY): Payer: Self-pay | Admitting: *Deleted

## 2011-10-13 DIAGNOSIS — D649 Anemia, unspecified: Secondary | ICD-10-CM | POA: Insufficient documentation

## 2011-10-13 DIAGNOSIS — E785 Hyperlipidemia, unspecified: Secondary | ICD-10-CM | POA: Insufficient documentation

## 2011-10-13 DIAGNOSIS — K219 Gastro-esophageal reflux disease without esophagitis: Secondary | ICD-10-CM | POA: Insufficient documentation

## 2011-10-13 DIAGNOSIS — K4020 Bilateral inguinal hernia, without obstruction or gangrene, not specified as recurrent: Secondary | ICD-10-CM | POA: Insufficient documentation

## 2011-10-13 DIAGNOSIS — I10 Essential (primary) hypertension: Secondary | ICD-10-CM | POA: Insufficient documentation

## 2011-10-13 DIAGNOSIS — Z79899 Other long term (current) drug therapy: Secondary | ICD-10-CM | POA: Insufficient documentation

## 2011-10-13 HISTORY — PX: INGUINAL HERNIA REPAIR: SHX194

## 2011-10-13 HISTORY — PX: HERNIA REPAIR: SHX51

## 2011-10-13 SURGERY — REPAIR, HERNIA, INGUINAL, LAPAROSCOPIC
Anesthesia: General | Laterality: Bilateral | Wound class: Clean

## 2011-10-13 MED ORDER — ALBUTEROL SULFATE (5 MG/ML) 0.5% IN NEBU
2.5000 mg | INHALATION_SOLUTION | Freq: Once | RESPIRATORY_TRACT | Status: AC | PRN
  Administered 2011-10-13: 2.5 mg via RESPIRATORY_TRACT

## 2011-10-13 MED ORDER — ONDANSETRON HCL 4 MG/2ML IJ SOLN
INTRAMUSCULAR | Status: DC | PRN
  Administered 2011-10-13: 4 mg via INTRAVENOUS

## 2011-10-13 MED ORDER — FENTANYL CITRATE 0.05 MG/ML IJ SOLN
INTRAMUSCULAR | Status: DC | PRN
  Administered 2011-10-13: 100 ug via INTRAVENOUS
  Administered 2011-10-13 (×3): 50 ug via INTRAVENOUS

## 2011-10-13 MED ORDER — FENTANYL CITRATE 0.05 MG/ML IJ SOLN: 25.0000 ug | INTRAMUSCULAR | Status: DC | PRN

## 2011-10-13 MED ORDER — CEFAZOLIN SODIUM 1-5 GM-% IV SOLN
INTRAVENOUS | Status: AC
  Filled 2011-10-13: qty 50

## 2011-10-13 MED ORDER — ACETAMINOPHEN 10 MG/ML IV SOLN
INTRAVENOUS | Status: DC | PRN
  Administered 2011-10-13: 1000 mg via INTRAVENOUS

## 2011-10-13 MED ORDER — ALBUTEROL SULFATE (5 MG/ML) 0.5% IN NEBU
INHALATION_SOLUTION | RESPIRATORY_TRACT | Status: AC
  Filled 2011-10-13: qty 0.5

## 2011-10-13 MED ORDER — ACETAMINOPHEN 10 MG/ML IV SOLN
INTRAVENOUS | Status: AC
  Filled 2011-10-13: qty 100

## 2011-10-13 MED ORDER — LACTATED RINGERS IV SOLN
INTRAVENOUS | Status: DC | PRN
  Administered 2011-10-13 (×3): via INTRAVENOUS

## 2011-10-13 MED ORDER — SODIUM CHLORIDE 0.9 % IJ SOLN
INTRAMUSCULAR | Status: DC | PRN
  Administered 2011-10-13: 15 mL

## 2011-10-13 MED ORDER — LACTATED RINGERS IV SOLN: INTRAVENOUS | Status: DC

## 2011-10-13 MED ORDER — PROPOFOL 10 MG/ML IV EMUL
INTRAVENOUS | Status: DC | PRN
  Administered 2011-10-13: 180 mg via INTRAVENOUS

## 2011-10-13 MED ORDER — GLYCOPYRROLATE 0.2 MG/ML IJ SOLN
INTRAMUSCULAR | Status: DC | PRN
  Administered 2011-10-13: .4 mg via INTRAVENOUS

## 2011-10-13 MED ORDER — MIDAZOLAM HCL 5 MG/5ML IJ SOLN
INTRAMUSCULAR | Status: DC | PRN
  Administered 2011-10-13 (×2): 1 mg via INTRAVENOUS

## 2011-10-13 MED ORDER — ROCURONIUM BROMIDE 100 MG/10ML IV SOLN
INTRAVENOUS | Status: DC | PRN
Start: 2011-10-13 — End: 2011-10-13
  Administered 2011-10-13: 45 mg via INTRAVENOUS

## 2011-10-13 MED ORDER — SODIUM CHLORIDE 0.9 % IR SOLN
Status: DC | PRN
  Administered 2011-10-13: 1000 mL

## 2011-10-13 MED ORDER — BUPIVACAINE LIPOSOME 1.3 % IJ SUSP
20.0000 mL | INTRAMUSCULAR | Status: AC
  Administered 2011-10-13: 15 mL
  Filled 2011-10-13: qty 20

## 2011-10-13 MED ORDER — NEOSTIGMINE METHYLSULFATE 1 MG/ML IJ SOLN
INTRAMUSCULAR | Status: DC | PRN
  Administered 2011-10-13: 3 mg via INTRAVENOUS

## 2011-10-13 MED ORDER — HYDROCODONE-ACETAMINOPHEN 5-325 MG PO TABS: 1.0000 | ORAL_TABLET | ORAL | Status: AC | PRN

## 2011-10-13 MED ORDER — LIDOCAINE HCL (CARDIAC) 20 MG/ML IV SOLN
INTRAVENOUS | Status: DC | PRN
  Administered 2011-10-13: 100 mg via INTRAVENOUS

## 2011-10-13 MED ORDER — CEFAZOLIN SODIUM 1-5 GM-% IV SOLN
1.0000 g | INTRAVENOUS | Status: AC
  Administered 2011-10-13: 1 g via INTRAVENOUS

## 2011-10-13 MED ORDER — HYDROCODONE-ACETAMINOPHEN 5-325 MG PO TABS
ORAL_TABLET | ORAL | Status: AC
  Administered 2011-10-13: 1 via ORAL
  Filled 2011-10-13: qty 1

## 2011-10-13 MED ORDER — BUPIVACAINE-EPINEPHRINE PF 0.5-1:200000 % IJ SOLN
INTRAMUSCULAR | Status: DC | PRN
  Administered 2011-10-13: 23 mL

## 2011-10-13 MED ORDER — HYDROCODONE-ACETAMINOPHEN 5-325 MG PO TABS
1.0000 | ORAL_TABLET | ORAL | Status: DC | PRN
  Administered 2011-10-13: 1 via ORAL

## 2011-10-13 MED ORDER — PROMETHAZINE HCL 25 MG/ML IJ SOLN: 6.2500 mg | INTRAMUSCULAR | Status: DC | PRN

## 2011-10-13 MED ORDER — BUPIVACAINE-EPINEPHRINE (PF) 0.5% -1:200000 IJ SOLN
INTRAMUSCULAR | Status: AC
  Filled 2011-10-13: qty 10

## 2011-10-13 SURGICAL SUPPLY — 45 items
ADH SKN CLS APL DERMABOND .7 (GAUZE/BANDAGES/DRESSINGS) ×3
APL SKNCLS STERI-STRIP NONHPOA (GAUZE/BANDAGES/DRESSINGS)
APPLIER CLIP LOGIC TI 5 (MISCELLANEOUS) IMPLANT
APR CLP MED LRG 33X5 (MISCELLANEOUS)
BENZOIN TINCTURE PRP APPL 2/3 (GAUZE/BANDAGES/DRESSINGS) IMPLANT
CABLE HIGH FREQUENCY MONO STRZ (ELECTRODE) ×1 IMPLANT
CLOTH BEACON ORANGE TIMEOUT ST (SAFETY) ×2 IMPLANT
COVER SURGICAL LIGHT HANDLE (MISCELLANEOUS) ×1 IMPLANT
DECANTER SPIKE VIAL GLASS SM (MISCELLANEOUS) ×2 IMPLANT
DERMABOND ADVANCED (GAUZE/BANDAGES/DRESSINGS) ×3
DERMABOND ADVANCED .7 DNX12 (GAUZE/BANDAGES/DRESSINGS) IMPLANT
DISSECT BALLN SPACEMKR + OVL (BALLOONS) ×2
DISSECTOR BALLN SPACEMKR + OVL (BALLOONS) ×1 IMPLANT
DISSECTOR BLUNT TIP ENDO 5MM (MISCELLANEOUS) IMPLANT
DRAPE LAPAROSCOPIC ABDOMINAL (DRAPES) ×2 IMPLANT
ELECT REM PT RETURN 9FT ADLT (ELECTROSURGICAL) ×2
ELECTRODE REM PT RTRN 9FT ADLT (ELECTROSURGICAL) ×1 IMPLANT
GENICON ×1 IMPLANT
GLOVE EUDERMIC 7 POWDERFREE (GLOVE) ×2 IMPLANT
GOWN STRL NON-REIN LRG LVL3 (GOWN DISPOSABLE) ×2 IMPLANT
GOWN STRL REIN XL XLG (GOWN DISPOSABLE) ×4 IMPLANT
KIT BASIN OR (CUSTOM PROCEDURE TRAY) ×2 IMPLANT
MESH ULTRAPRO 3X6 7.6X15CM (Mesh General) ×2 IMPLANT
NDL INSUFFLATION 14GA 120MM (NEEDLE) IMPLANT
NEEDLE INSUFFLATION 14GA 120MM (NEEDLE) IMPLANT
SCISSORS LAP 5X35 DISP (ENDOMECHANICALS) IMPLANT
SET IRRIG TUBING LAPAROSCOPIC (IRRIGATION / IRRIGATOR) IMPLANT
SOLUTION ANTI FOG 6CC (MISCELLANEOUS) ×2 IMPLANT
STOPCOCK K 69 2C6206 (IV SETS) IMPLANT
STRIP CLOSURE SKIN 1/2X4 (GAUZE/BANDAGES/DRESSINGS) IMPLANT
SUT MNCRL AB 4-0 PS2 18 (SUTURE) ×3 IMPLANT
SUT PROLENE 2 0 CT2 30 (SUTURE) ×6 IMPLANT
SUT SILK 2 0 (SUTURE) ×2
SUT SILK 2-0 18XBRD TIE 12 (SUTURE) IMPLANT
SUT VIC AB 2-0 SH 27 (SUTURE) ×6
SUT VIC AB 2-0 SH 27X BRD (SUTURE) IMPLANT
SUT VIC AB 3-0 SH 27 (SUTURE) ×6
SUT VIC AB 3-0 SH 27XBRD (SUTURE) IMPLANT
TACKER 5MM HERNIA 3.5CML NAB (ENDOMECHANICALS) ×2 IMPLANT
TOWEL OR 17X26 10 PK STRL BLUE (TOWEL DISPOSABLE) ×2 IMPLANT
TRAY FOLEY CATH 14FRSI W/METER (CATHETERS) ×2 IMPLANT
TRAY LAP CHOLE (CUSTOM PROCEDURE TRAY) ×2 IMPLANT
TROCAR BLADELESS OPT 5 75 (ENDOMECHANICALS) IMPLANT
TROCAR CANNULA W/PORT DUAL 5MM (MISCELLANEOUS) ×2 IMPLANT
TUBING INSUFFLATION 10FT LAP (TUBING) ×2 IMPLANT

## 2011-10-13 NOTE — Op Note (Signed)
Patient Name:   [name]  Date of Surgery:   10/13/2011  Pre op Diagnosis:   Bilateral inguinal hernias  Post op Diagnosis:   Bilateral inguinal hernias  Procedure:    Laparoscopy, open repair bilateral inguinal hernias with mesh  Surgeon:   Angelia Mould. Derrell Lolling, M.D., [facility]   Operative Indications:   This is a 77 year old African American female who has bilateral inguinal hernias. The left one has been getting larger and is symptomatic. She is operated upon electively.  Operative Findings:   Initial laparoscopy revealed that the parietal peritoneum was very thin and tore in multiple places. Because of this we abandoned the laparoscopic approach because of concern that the mesh would be exposed to the small bowel and colon causing adhesions and obstruction in the future. For this reason we converted to an open anterior approach. She had moderately large bilateral indirect inguinal hernias which were reducible.  Procedure in Detail:   Following the induction of general endotracheal anesthesia, a Foley catheter was placed. Intravenous antibiotics were given.. Abdomen and genitalia were prepped and draped in a sterile fashion. Surgical timeout was held. Local anesthesia with 0.5% Marcaine with epinephrine as well as exparel at the end of the case was used.  A transverse incision was made below the umbilicus. The fascia was incised transversely exposing the left rectus sheath and rectus muscle. The rectus muscle was retracted laterally and the peritoneum behind the rectus muscles noted to be very thin. The spacemaker balloon was inserted and inflated and when we removed that we found there were multiple large tears in the parietal peritoneum, but no injury to the bowel. We chose to abandon the laparoscopic approach because of exposure of mesh to bowel. The fascia was closed with interrupted figure-of-eight sutures of 0 Vicryl and the skin closed with a subcuticular suture of 4-0 Monocryl and  Dermabond.  We then made a bilateral transverse inguinal incisions. The technique was identical on both sides. After the skin incisions were made dissection was carried down through the subcutaneous tissue exposing the aponeurosis of the external oblique fascia. The external oblique was cleaned off exposing and identifying the external inguinal ring. The external oblique was incised in the direction of its fibers opening of the external inguinal ring. The external oblique was dissected away from the underlying tissues and self-retaining retractors were placed. A large indirect hernia was found on each side. This was dissected away from surrounding tissues. The round ligaments was divided at the pubic tubercle. We reduced the hernia and oversewed the tissues with a running suture of 2-0 Vicryl. On both sides we used a 3" x 6" piece of ultra Pro mesh. This was trimmed at the edges to accommodate the wound. The mesh was sutured in place with interrupted sutures of 2-0 Prolene and running sutures of 2-0 Prolene. This provided very secure repair both medial and lateral to the hernia. There was no bleeding. The wounds were irrigated with saline. The external oblique was closed with a running suture of 2-0 Vicryl. Scarpa's fascia was closed with 3-0 Vicryl and the skin closed with running subcuticular sutures of 4-0 Monocryl and Dermabond. Patient tolerated the procedure well and was taken recovery in stable condition. EBL was 20 cc. Complications none. Counts were correct.   Ernestene Mention 10/13/2011 10:49 AM

## 2011-10-13 NOTE — Progress Notes (Signed)
Report given to Kennith Center, R.N.  For lunch relief

## 2011-10-13 NOTE — Progress Notes (Signed)
Patient 's respirations regular and unlabored- no congestion noted; cough stronger; patient states she feels better; now on simple o2 mask at 10 liters per minute.

## 2011-10-13 NOTE — Transfer of Care (Signed)
Immediate Anesthesia Transfer of Care Note  Patient: [name]  Procedure(s) Performed:  LAPAROSCOPIC INGUINAL HERNIA - Laparoscopic bilateral inguinal hernia repair, converted to open bilateral inguinal hernia repairs with mesh  Patient Location: PACU  Anesthesia Type: General  Level of Consciousness: awake and alert   Airway & Oxygen Therapy: Patient Spontanous Breathing and Patient connected to face mask oxygen  Post-op Assessment: Report given to PACU RN  Post vital signs: Reviewed and stable  Complications: No apparent anesthesia complications

## 2011-10-13 NOTE — H&P (View-Only) (Signed)
[name]   10/04/2011 8:45 AM Office Visit  MRN: [id number]   Description: 77 year old female  Provider: Ernestene Mention, MD  Department: [facility]        Diagnoses     Bilateral inguinal hernia   - Primary    550.92      Reason for Visit     New Evaluation    eval of LIH         Vitals - Last Recorded       BP Pulse Temp(Src) Resp Ht Wt    144/78  60  97.8 F (36.6 C) (Temporal)  18  5\' 3"  (1.6 m)  140 lb 2 oz (63.56 kg)          BMI              24.82 kg/m2                 Progress Notes     Ernestene Mention, MD  10/04/2011  9:27 AM  SignedChief Complaint   Patient presents with   .  New Evaluation       eval of LIH       HPI [name] is a 77 y.o. female.     This pleasant woman was referred to me by Dr. Tally Joe for evaluation of a symptomatic left inguinal hernia. She also wonders if she has a little bulge on the right side.   She is fairly healthy. She has hypertension which is well-controlled, hyperlipidemia, well-controlled reflux , and iron deficiency anemia. She has never had any surgery at any time.   She's noticed a bulge in her left groin for one to 2 years. It is slightly larger and she's having some pain. She saw Dr. Azucena Cecil in the office and he was able to diagnose a left inguinal hernia. HPI    Past Medical History   Diagnosis  Date   .  Hyperlipidemia     .  Hypertension     .  GERD (gastroesophageal reflux disease)     .  Anemia         Past Surgical History   Procedure  Date   .  Colonoscopy        History reviewed. No pertinent family history.   Social History History   Substance Use Topics   .  Smoking status:  Never Smoker    .  Smokeless tobacco:  Never Used   .  Alcohol Use:  No      No Known Allergies    Current Outpatient Prescriptions   Medication  Sig  Dispense  Refill   .  atorvastatin (LIPITOR) 80 MG tablet  Take 80 mg by mouth daily.           Marland Kitchen  ezetimibe (ZETIA) 10 MG tablet   Take 10 mg by mouth daily.           Marland Kitchen  triamterene-hydrochlorothiazide (MAXZIDE-25) 37.5-25 MG per tablet  Take 1 tablet by mouth daily.              Review of Systems Review of Systems  Constitutional: Negative for fever, chills and unexpected weight change.  HENT: Negative for hearing loss, congestion, sore throat, trouble swallowing and voice change.   Eyes: Negative for visual disturbance.  Respiratory: Negative for cough and wheezing.   Cardiovascular: Negative for chest pain, palpitations and leg swelling.  Gastrointestinal: Negative for nausea, vomiting, abdominal pain,  diarrhea, constipation, blood in stool, abdominal distention and anal bleeding.  Genitourinary: Negative for hematuria, vaginal bleeding and difficulty urinating.  Musculoskeletal: Negative for arthralgias.  Skin: Negative for rash and wound.  Neurological: Negative for seizures, syncope and headaches.  Hematological: Negative for adenopathy. Does not bruise/bleed easily.  Psychiatric/Behavioral: Negative for confusion.    Blood pressure 144/78, pulse 60, temperature 97.8 F (36.6 C), temperature source Temporal, resp. rate 18, height 5\' 3"  (1.6 m), weight 140 lb 2 oz (63.56 kg).   Physical Exam Physical Exam  Constitutional: She is oriented to person, place, and time. She appears well-developed and well-nourished. No distress.  HENT:   Head: Normocephalic and atraumatic.   Nose: Nose normal.   Mouth/Throat: No oropharyngeal exudate.  Eyes: Conjunctivae and EOM are normal. Pupils are equal, round, and reactive to light. Left eye exhibits no discharge. No scleral icterus.  Neck: Neck supple. No JVD present. No tracheal deviation present. Thyromegaly present.       Small, soft, diffuse goiter.  Cardiovascular: Normal rate, regular rhythm, normal heart sounds and intact distal pulses.    No murmur heard. Pulmonary/Chest: Effort normal and breath sounds normal. No respiratory distress. She has no wheezes. She  has no rales. She exhibits no tenderness.  Abdominal: Soft. Bowel sounds are normal. She exhibits no distension and no mass. There is no tenderness. There is no rebound and no guarding.  Genitourinary:       Bilateral inguinal hernias,medium-sized,easily reducible. No adenopathy. Umbilicus is normal.  Musculoskeletal: She exhibits no edema and no tenderness.  Lymphadenopathy:    She has no cervical adenopathy.  Neurological: She is alert and oriented to person, place, and time. She exhibits normal muscle tone. Coordination normal.  Skin: Skin is warm. No rash noted. She is not diaphoretic. No erythema. No pallor.  Psychiatric: She has a normal mood and affect. Her behavior is normal. Judgment and thought content normal.    Data Reviewed I reviewed Dr. Genoveva Ill office notes.   Assessment Bilateral inguinal hernias, symptomatic on the left.   Hypertension, well controlled   Hyperlipidemia   Mild gastroesophageal reflux disease.   History of iron deficiency anemia.   Question small goiter. Asymptomatic.   Plan After lengthy discussion regarding the anatomy of her hernias, and different techniques for repair, she decided she would like to be scheduled for laparoscopic repair of her bilateral inguinal hernias with mesh.   She will be scheduled for laparoscopic repair of bilateral inguinal hernias with mesh, possible open in the near future.   I discussed the indications and details of the surgery with her. Risks and complications have been outlined, including but limited to bleeding, infection, conversion to open surgery, recurrence of the hernia, injury to adjacent organs such as the intestine or bladder with major reconstructive surgery, nerve damage with chronic pain or numbness, cardiac pulmonary and thromboembolic problems. She understands these issues well. She is in full agreement with this plan. All of her questions are answered.       Aziza Stuckert  M 10/04/2011, 9:19 AM                Not recorded      Pending        Disp Refills Start End    ceFAZolin (ANCEF) 1 g in dextrose 5 % 50 mL IVPB     10/04/2011      Route:  Intravenous    Class:  Normal  Patient Instructions     You have bilateral inguinal hernias. You have decided to proceed with scheduling of repair of the bilateral inguinal hernias, and we are going to try to do this laparoscopically. We will schedule the surgery in the near future at your convenience.   Hernia Repair with Laparoscope A hernia occurs when an internal organ pushes out through a weak spot in the belly (abdominal) wall muscles. Hernias most commonly occur in the groin and around the navel. Hernias can also occur through a cut by the surgeon (incision) after an abdominal operation. A hernia may be caused by: Lifting heavy objects.   Prolonged coughing.   Straining to move your bowels.  Hernias can often be pushed back into place (reduced). Most hernias tend to get worse over time. Problems occur when abdominal contents get stuck in the opening and the blood supply is blocked or impaired (incarcerated hernia). Because of these risks, you require surgery to repair the hernia. Your hernia will be repaired using a laparoscope. Laparoscopic surgery is a type of minimally invasive surgery. It does not involve making a typical surgical cut (incision) in the skin. A laparoscope is a telescope-like rod and lens system. It is usually connected to a video camera and a light source so your caregiver can clearly see the operative area. The instruments are inserted through  to  inch (5 mm or 10 mm) openings in the skin at specific locations. A working and viewing space is created by blowing a small amount of carbon dioxide gas into the abdominal cavity. The abdomen is essentially blown up like a balloon (insufflated). This elevates the abdominal wall above the internal organs like a dome. The carbon  dioxide gas is common to the human body and can be absorbed by tissue and removed by the respiratory system. Once the repair is completed, the small incisions will be closed with either stitches (sutures) or staples (just like a paper stapler only this staple holds the skin together). LET YOUR CAREGIVERS KNOW ABOUT: Allergies.   Medications taken including herbs, eye drops, over the counter medications, and creams.   Use of steroids (by mouth or creams).   Previous problems with anesthetics or Novocaine.   Possibility of pregnancy, if this applies.   History of blood clots (thrombophlebitis).   History of bleeding or blood problems.   Previous surgery.   Other health problems.  BEFORE THE PROCEDURE   Laparoscopy can be done either in a hospital or out-patient clinic. You may be given a mild sedative to help you relax before the procedure. Once in the operating room, you will be given a general anesthesia to make you sleep (unless you and your caregiver choose a different anesthetic).   AFTER THE PROCEDURE   After the procedure you will be watched in a recovery area. Depending on what type of hernia was repaired, you might be admitted to the hospital or you might go home the same day. With this procedure you may have less pain and scarring. This usually results in a quicker recovery and less risk of infection. HOME CARE INSTRUCTIONS   Bed rest is not required. You may continue your normal activities but avoid heavy lifting (more than 10 pounds) or straining.   Cough gently. If you are a smoker it is best to stop, as even the best hernia repair can break down with the continual strain of coughing.   Avoid driving until given the OK by your surgeon.   There are  no dietary restrictions unless given otherwise.   TAKE ALL MEDICATIONS AS DIRECTED.   Only take over-the-counter or prescription medicines for pain, discomfort, or fever as directed by your caregiver.  SEEK MEDICAL CARE IF:   There is  increasing abdominal pain or pain in your incisions.   There is more bleeding from incisions, other than minimal spotting.   You feel light headed or faint.   You develop an unexplained fever, chills, and/or an oral temperature above 102 F (38.9 C).   You have redness, swelling, or increasing pain in the wound.   Pus coming from wound.   A foul smell coming from the wound or dressings.  SEEK IMMEDIATE MEDICAL CARE IF:   You develop a rash.   You have difficulty breathing.   You have any allergic problems.  MAKE SURE YOU:   Understand these instructions.   Will watch your condition.   Will get help right away if you are not doing well or get worse.  Document Released: 11/08/2005 Document Revised: 07/21/2011 Document Reviewed: 10/08/2009 [facility] Patient Information 2012 [place], [place].   Inguinal Hernia, Adult  Muscles help keep everything in the body in its proper place. But if a weak spot in the muscles develops, something can poke through. That is called a hernia. When this happens in the lower part of the belly (abdomen), it is called an inguinal hernia. (It takes its name from a part of the body in this region called the inguinal canal.) A weak spot in the wall of muscles lets some fat or part of the small intestine bulge through. An inguinal hernia can develop at any age. Men get them more often than women. CAUSES   In adults, an inguinal hernia develops over time. It can be triggered by:   Suddenly straining the muscles of the lower abdomen.   Lifting heavy objects.   Straining to have a bowel movement. Difficult bowel movements (constipation) can lead to this.   Constant coughing. This may be caused by smoking or lung disease.   Being overweight.   Being pregnant.   Working at a job that requires [place] periods of standing or heavy lifting.   Having had an inguinal hernia before.  One type can be an emergency situation. It is called a strangulated inguinal hernia. It develops  if part of the small intestine slips through the weak spot and cannot get back into the abdomen. The blood supply can be cut off. If that happens, part of the intestine may die. This situation requires emergency surgery. SYMPTOMS   Often, a small inguinal hernia has no symptoms. It is found when a healthcare provider does a physical exam. Larger hernias usually have symptoms.   In adults, symptoms may include:   A lump in the groin. This is easier to see when the person is standing. It might disappear when lying down.   In men, a lump in the scrotum.   Pain or burning in the groin. This occurs especially when lifting, straining or coughing.   A dull ache or feeling of pressure in the groin.   Signs of a strangulated hernia can include:   A bulge in the groin that becomes very painful and tender to the touch.   A bulge that turns red or purple.   Fever, nausea and vomiting.   Inability to have a bowel movement or to pass gas.  DIAGNOSIS   To decide if you have an inguinal hernia, a healthcare provider will  probably do a physical examination. This will include asking questions about any symptoms you have noticed.   The healthcare provider might feel the groin area and ask you to cough. If an inguinal hernia is felt, the healthcare provider may try to slide it back into the abdomen.   Usually no other tests are needed.  TREATMENT   Treatments can vary. The size of the hernia makes a difference. Options include: Watchful waiting. This is often suggested if the hernia is small and you have had no symptoms.   No medical procedure will be done unless symptoms develop.   You will need to watch closely for symptoms. If any occur, contact your healthcare provider right away.   Surgery. This is used if the hernia is larger or you have symptoms.   Open surgery. This is usually an outpatient procedure (you will not stay overnight in a hospital). An cut (incision) is made through the skin in the groin. The  hernia is put back inside the abdomen. The weak area in the muscles is then repaired by herniorrhaphy or hernioplasty. Herniorrhaphy: in this type of surgery, the weak muscles are sewn back together. Hernioplasty: a patch or mesh is used to close the weak area in the abdominal wall.   Laparoscopy. In this procedure, a surgeon makes small incisions. A thin tube with a tiny video camera (called a laparoscope) is put into the abdomen. The surgeon repairs the hernia with mesh by looking with the video camera and using two Safley instruments.  HOME CARE INSTRUCTIONS   After surgery to repair an inguinal hernia:   You will need to take pain medicine prescribed by your healthcare provider. Follow all directions carefully.   You will need to take care of the wound from the incision.   Your activity will be restricted for awhile. This will probably include no heavy lifting for several weeks. You also should not do anything too active for a few weeks. When you can return to work will depend on the type of job that you have.   During "watchful waiting" periods, you should:   Maintain a healthy weight.   Eat a diet high in fiber (fruits, vegetables and whole grains).   Drink plenty of fluids to avoid constipation. This means drinking enough water and other liquids to keep your urine clear or pale yellow.   Do not lift heavy objects.   Do not stand for Ka[place] periods of time.   Quit smoking. This should keep you from developing a frequent cough.  SEEK MEDICAL CARE IF:   A bulge develops in your groin area.   You feel pain, a burning sensation or pressure in the groin. This might be worse if you are lifting or straining.   You develop a fever of more than 100.5 F (38.1 C).  SEEK IMMEDIATE MEDICAL CARE IF:   Pain in the groin increases suddenly.   A bulge in the groin gets bigger suddenly and does not go down.   For men, there is sudden pain in the scrotum. Or, the size of the scrotum increases.   A bulge in  the groin area becomes red or purple and is painful to touch.   You have nausea or vomiting that does not go away.   You feel your heart beating much faster than normal.   You cannot have a bowel movement or pass gas.   You develop a fever of more than 102.0 F (38.9 C).  Document Released:  03/27/2009 Document Revised: 07/21/2011 Document Reviewed: 03/27/2009 [facility] Patient Information 2012 [place], [place].       Level of Service     PR OFFICE/OUTPT VISIT,NEW,LEVL III [id number]         All Flowsheet Templates (all recorded)     Encounter Vitals Flowsheet    Custom Formula Data Flowsheet    Anthropometrics Flowsheet                      All Charges for This Encounter       Code Description Service Date Service Provider Modifiers Quantity    [phone] PR OFFICE/OUTPT VISIT,NEW,LEVL III 10/04/2011 Ernestene Mention, MD   1        Other Encounter Related Information     Allergies & Medications         Problem List         History         Patient-Entered Questionnaires     No data filed

## 2011-10-13 NOTE — Anesthesia Preprocedure Evaluation (Addendum)
Anesthesia Evaluation  Patient identified by MRN, date of birth, ID band Patient awake    Reviewed: Allergy & Precautions, H&P , NPO status , Patient's Chart, lab work & pertinent test results  Airway Mallampati: II TM Distance: >3 FB Neck ROM: Full    Dental No notable dental hx. (+) Partial Lower, Edentulous Upper and Upper Dentures   Pulmonary neg pulmonary ROS,  clear to auscultation  Pulmonary exam normal       Cardiovascular hypertension, Pt. on medications Regular Normal    Neuro/Psych Negative Neurological ROS  Negative Psych ROS   GI/Hepatic Neg liver ROS, hiatal hernia, GERD-  Medicated and Controlled,  Endo/Other  Negative Endocrine ROS  Renal/GU negative Renal ROS  Genitourinary negative   Musculoskeletal negative musculoskeletal ROS (+)   Abdominal   Peds negative pediatric ROS (+)  Hematology negative hematology ROS (+)   Anesthesia Other Findings   Reproductive/Obstetrics negative OB ROS                          Anesthesia Physical Anesthesia Plan  ASA: II  Anesthesia Plan: General   Post-op Pain Management:    Induction: Intravenous  Airway Management Planned: Oral ETT  Additional Equipment:   Intra-op Plan:   Post-operative Plan: Extubation in OR  Informed Consent: I have reviewed the patients History and Physical, chart, labs and discussed the procedure including the risks, benefits and alternatives for the proposed anesthesia with the patient or authorized representative who has indicated his/her understanding and acceptance.   Dental advisory given  Plan Discussed with: CRNA  Anesthesia Plan Comments:         Anesthesia Quick Evaluation

## 2011-10-13 NOTE — Anesthesia Postprocedure Evaluation (Signed)
Anesthesia Post Note  Patient: [name]  Procedure(s) Performed:  LAPAROSCOPIC INGUINAL HERNIA - Laparoscopic bilateral inguinal hernia repair, converted to open bilateral inguinal hernia repairs with mesh  Anesthesia type: General  Patient location: PACU  Post pain: Pain level controlled  Post assessment: Post-op Vital signs reviewed  Last Vitals:  Filed Vitals:   10/13/11 1130  BP: 147/70  Pulse: 56  Temp: 36.3 C  Resp: 11    Post vital signs: Reviewed  Level of consciousness: sedated  Complications: No apparent anesthesia complications

## 2011-10-13 NOTE — Progress Notes (Signed)
Dr. Rica Mast notified of patient's weak cough ; upper respiratory congestion- orders given- sa02 100-

## 2011-10-13 NOTE — Interval H&P Note (Signed)
History and Physical Interval Note:   10/13/2011   8:21 AM   [name]  has presented today for surgery, with the diagnosis of Bilateral Inguinal Hernia  The various methods of treatment have been discussed with the patient and family. After consideration of risks, benefits and other options for treatment, the patient has consented to  Procedure(s): LAPAROSCOPIC INGUINAL HERNIA as a surgical intervention .  The patients' history has been reviewed, patient examined, no change in status, stable for surgery.  I have reviewed the patients' chart and labs.  Questions were answered to the patient's satisfaction.     Ernestene Mention  MD

## 2011-10-19 ENCOUNTER — Telehealth (INDEPENDENT_AMBULATORY_CARE_PROVIDER_SITE_OTHER): Payer: Self-pay

## 2011-10-19 NOTE — Telephone Encounter (Signed)
Pt home doing well. Po appt made. Pt to call with concerns. 

## 2011-10-22 ENCOUNTER — Encounter (HOSPITAL_COMMUNITY): Payer: Self-pay | Admitting: General Surgery

## 2011-11-01 ENCOUNTER — Ambulatory Visit (INDEPENDENT_AMBULATORY_CARE_PROVIDER_SITE_OTHER): Payer: Medicare Other | Admitting: General Surgery

## 2011-11-01 ENCOUNTER — Encounter (INDEPENDENT_AMBULATORY_CARE_PROVIDER_SITE_OTHER): Payer: Self-pay | Admitting: General Surgery

## 2011-11-01 VITALS — BP 128/78 | HR 76 | Temp 98.2°F | Resp 18 | Ht 60.0 in | Wt 142.0 lb

## 2011-11-01 DIAGNOSIS — Z9889 Other specified postprocedural states: Secondary | ICD-10-CM

## 2011-11-01 NOTE — Patient Instructions (Signed)
All of the incisions from your hernia surgery have ealed very nicely. There is no sign of any infection or complication. You are restricted to 20 pounds lifting until after Christmas day, and after that you may resume all physical activities that you were capable of before. Return to see me if there are any further problems.

## 2011-11-01 NOTE — Progress Notes (Signed)
Subjective:     Patient ID: [name], female   DOB: October 22, 1932, 77 y.o.   MRN: [id number]  HPI  This 77 year old African American female underwent laparoscopic, converted to open repair of bilateral inguinal hernias with mesh. Date of surgery October 13, 2011.   he is doing very well. Most of her pain has resolved.     She still feels a little bit of tingling in the incisions. She is ambulating without problems but she uses a cane chronically because of knee pain. Review of Systems     Objective:   Physical Exam Bilateral groin incisions and umbilical incision have healed without any problems. No drainage no infection no sign of any recurrence of the hernia.    Assessment:     Bilateral inguinal hernias, uneventful recovery following open repair with mesh.    Plan:     Okay to resume all normal physical activities without restriction after December 25.  Return to see me p.r.n.

## 2013-11-20 ENCOUNTER — Ambulatory Visit
Admission: RE | Admit: 2013-11-20 | Discharge: 2013-11-20 | Disposition: A | Discharge status: Home / Self Care | Payer: Medicare Other | Source: Ambulatory Visit | Attending: Family Medicine | Admitting: Family Medicine

## 2013-11-20 ENCOUNTER — Other Ambulatory Visit: Payer: Self-pay | Admitting: Family Medicine

## 2013-11-20 DIAGNOSIS — J209 Acute bronchitis, unspecified: Secondary | ICD-10-CM

## 2014-08-20 ENCOUNTER — Ambulatory Visit
Admission: RE | Admit: 2014-08-20 | Discharge: 2014-08-20 | Disposition: A | Discharge status: Home / Self Care | Payer: Medicare Other | Source: Ambulatory Visit | Attending: Family Medicine | Admitting: Family Medicine

## 2014-08-20 ENCOUNTER — Other Ambulatory Visit: Payer: Self-pay | Admitting: Family Medicine

## 2014-08-20 DIAGNOSIS — R519 Headache, unspecified: Secondary | ICD-10-CM

## 2014-08-20 DIAGNOSIS — R51 Headache: Principal | ICD-10-CM

## 2015-12-08 ENCOUNTER — Other Ambulatory Visit (HOSPITAL_COMMUNITY): Payer: Self-pay | Admitting: Family Medicine

## 2015-12-08 DIAGNOSIS — R011 Cardiac murmur, unspecified: Secondary | ICD-10-CM

## 2015-12-18 ENCOUNTER — Ambulatory Visit (HOSPITAL_COMMUNITY): Payer: Medicare Other | Attending: Cardiovascular Disease

## 2015-12-18 DIAGNOSIS — I517 Cardiomegaly: Secondary | ICD-10-CM | POA: Diagnosis not present

## 2015-12-18 DIAGNOSIS — R011 Cardiac murmur, unspecified: Secondary | ICD-10-CM | POA: Insufficient documentation

## 2016-02-07 ENCOUNTER — Emergency Department (HOSPITAL_COMMUNITY)
Admission: EM | Admit: 2016-02-07 | Discharge: 2016-02-08 | Disposition: A | Discharge status: Home / Self Care | Payer: Medicare Other | Attending: Emergency Medicine | Admitting: Emergency Medicine

## 2016-02-07 ENCOUNTER — Encounter (HOSPITAL_COMMUNITY): Payer: Self-pay | Admitting: *Deleted

## 2016-02-07 ENCOUNTER — Emergency Department (HOSPITAL_COMMUNITY): Payer: Medicare Other

## 2016-02-07 DIAGNOSIS — Z79899 Other long term (current) drug therapy: Secondary | ICD-10-CM | POA: Insufficient documentation

## 2016-02-07 DIAGNOSIS — M25461 Effusion, right knee: Secondary | ICD-10-CM | POA: Insufficient documentation

## 2016-02-07 DIAGNOSIS — I10 Essential (primary) hypertension: Secondary | ICD-10-CM | POA: Diagnosis not present

## 2016-02-07 DIAGNOSIS — M25561 Pain in right knee: Secondary | ICD-10-CM | POA: Diagnosis present

## 2016-02-07 DIAGNOSIS — E785 Hyperlipidemia, unspecified: Secondary | ICD-10-CM | POA: Insufficient documentation

## 2016-02-07 DIAGNOSIS — D649 Anemia, unspecified: Secondary | ICD-10-CM | POA: Diagnosis not present

## 2016-02-07 DIAGNOSIS — Z8719 Personal history of other diseases of the digestive system: Secondary | ICD-10-CM | POA: Insufficient documentation

## 2016-02-07 LAB — SYNOVIAL CELL COUNT + DIFF, W/ CRYSTALS
LYMPHOCYTES-SYNOVIAL FLD: 11 % (ref 0–20)
MONOCYTE-MACROPHAGE-SYNOVIAL FLUID: 21 % — AB (ref 50–90)
Neutrophil, Synovial: 68 % — ABNORMAL HIGH (ref 0–25)
WBC, SYNOVIAL: 4470 /mm3 — AB (ref 0–200)

## 2016-02-07 MED ORDER — LIDOCAINE HCL 2 % IJ SOLN
15.0000 mL | Freq: Once | INTRAMUSCULAR | Status: AC
  Administered 2016-02-07: 200 mg
  Filled 2016-02-07: qty 20

## 2016-02-07 MED ORDER — TRAMADOL HCL 50 MG PO TABS: 50.0000 mg | ORAL_TABLET | Freq: Four times a day (QID) | ORAL | Status: DC | PRN

## 2016-02-07 MED ORDER — PREDNISONE 20 MG PO TABS: 40.0000 mg | ORAL_TABLET | Freq: Every day | ORAL | Status: DC

## 2016-02-07 NOTE — ED Provider Notes (Signed)
CSN: [id number]     Arrival date & time 02/07/16  1753 History   First MD Initiated Contact with Patient 02/07/16 2000     Chief Complaint  Patient presents with  . Knee Pain     (Consider location/radiation/quality/duration/timing/severity/associated sxs/prior Treatment) HPI Comments: 82 year old female with a history of dyslipidemia, hypertension, esophageal reflux, knee arthritis, and anemia presents to the emergency department for evaluation of right knee pain and swelling. Symptoms have been present for one week. She noticed onset after upper respiratory symptoms. Patient reports that she self diagnosed herself with the flu based on her symptoms. Pain in the patient's knee has been sharp, especially with weightbearing. Pain restricted her from weightbearing for 48 hours, though she has been able to walk today, per patient and family. Patient states that her knee has been red at times. She has had no associated fever. No extremity numbness or tingling. Patient reports a history of similar symptoms for which she has been seen by Dr. Alvan Dame in [facility]. She states that she was treated with steroid injections at this time which improved her symptoms, though orthopedics ultimately recommended surgical repair for future management.  Patient is a 82 y.o. female presenting with knee pain. The history is provided by the patient. No language interpreter was used.  Knee Pain   Past Medical History  Diagnosis Date  . Hyperlipidemia   . Hypertension   . GERD (gastroesophageal reflux disease)   . Anemia   . Hiatal hernia   . Arthritis     knees   . History of inguinal hernia repair     BIH   Past Surgical History  Procedure Laterality Date  . Colonoscopy    . Inguinal hernia repair  10/13/2011    Procedure: LAPAROSCOPIC INGUINAL HERNIA;  Surgeon: Adin Hector, MD;  Location: [facility];  Service: General;  Laterality: Bilateral;  Laparoscopic bilateral inguinal hernia repair,  converted to open bilateral inguinal hernia repairs with mesh  . Hernia repair  10/13/11    lap BIH rep w/ mesh- Dr. Dalbert Batman    Family History  Problem Relation Age of Onset  . Heart disease Mother    Social History  Substance Use Topics  . Smoking status: Never Smoker   . Smokeless tobacco: Never Used  . Alcohol Use: No   OB History    No data available      Review of Systems  Musculoskeletal: Positive for joint swelling and arthralgias.  All other systems reviewed and are negative.   Allergies  Review of patient's allergies indicates no known allergies.  Home Medications   Prior to Admission medications   Medication Sig Start Date End Date Taking? Authorizing Provider  atorvastatin (LIPITOR) 80 MG tablet Take 40 mg by mouth every morning.     Historical Provider, MD  ezetimibe (ZETIA) 10 MG tablet Take 10 mg by mouth every morning.     Historical Provider, MD  IRON PO Take 1 tablet by mouth daily. 65MG     Historical Provider, MD  triamterene-hydrochlorothiazide (MAXZIDE-25) 37.5-25 MG per tablet Take 1 tablet by mouth every morning.     Historical Provider, MD   BP 143/78 mmHg  Pulse 86  Temp(Src) 98.7 F (37.1 C) (Oral)  Resp 23  SpO2 100%   Physical Exam  Constitutional: She is oriented to person, place, and time. She appears well-developed and well-nourished. No distress.  Pleasant and nontoxic appearing  HENT:  Head: Normocephalic and atraumatic.  Eyes: Conjunctivae and EOM are  normal. No scleral icterus.  Neck: Normal range of motion.  Cardiovascular: Normal rate, regular rhythm and intact distal pulses.   DP and PT pulses 2+ in the RLE  Pulmonary/Chest: Effort normal. No respiratory distress.  Musculoskeletal: Normal range of motion.       Right knee: She exhibits swelling and effusion. She exhibits normal range of motion, no erythema, no LCL laxity and no MCL laxity. Tenderness found.  Large joint effusion to R knee with preserved AROM and PROM. No  distinct erythema. No heat to touch compared to L knee. No red linear streaking.  Neurological: She is alert and oriented to person, place, and time. She exhibits normal muscle tone. Coordination normal.  Sensation to light touch intact in BLE  Skin: Skin is warm and dry. No rash noted. She is not diaphoretic. No erythema. No pallor.  Psychiatric: She has a normal mood and affect. Her behavior is normal.  Nursing note and vitals reviewed.   ED Course  .Joint Aspiration/Arthrocentesis Date/Time: 02/07/2016 9:42 PM Performed by: Antonietta Breach Authorized by: Antonietta Breach Consent: The procedure was performed in an emergent situation. Verbal consent obtained. Risks and benefits: risks, benefits and alternatives were discussed Consent given by: patient Patient understanding: patient states understanding of the procedure being performed Patient consent: the patient's understanding of the procedure matches consent given Procedure consent: procedure consent matches procedure scheduled Relevant documents: relevant documents present and verified Test results: test results available and properly labeled Site marked: the operative site was marked Imaging studies: imaging studies available Required items: required blood products, implants, devices, and special equipment available Patient identity confirmed: verbally with patient and arm band Time out: Immediately prior to procedure a "time out" was called to verify the correct patient, procedure, equipment, support staff and site/side marked as required. Indications: joint swelling,  pain and possible septic joint  Body area: knee Joint: right knee Local anesthesia used: yes Anesthesia: local infiltration Local anesthetic: lidocaine 2% without epinephrine Patient sedated: no Preparation: Patient was prepped and draped in the usual sterile fashion. Needle gauge: 18 G Ultrasound guidance: no Approach: lateral Aspirate: yellow and serous Aspirate  amount: 65 mL Lidocaine 2% amount: 10 ml Patient tolerance: Patient tolerated the procedure well with no immediate complications Comments: R knee joint aspiration to evaluate for septic arthritis given onset after URI symptoms. 65cc aspirated without difficulty. Patient tolerated well without immediate complications.   (including critical care time) Labs Review Labs Reviewed  SYNOVIAL CELL COUNT + DIFF, W/ CRYSTALS - Abnormal; Notable for the following:    Color, Synovial STRAW (*)    Appearance-Synovial CLOUDY (*)    WBC, Synovial 4470 (*)    Neutrophil, Synovial 68 (*)    Monocyte-Macrophage-Synovial Fluid 21 (*)    All other components within normal limits  GRAM STAIN  CULTURE, BODY FLUID-BOTTLE    Imaging Review Dg Knee Complete 4 Views Right  02/07/2016  CLINICAL DATA:  Pain and swelling common no known injury, initial encounter EXAM: RIGHT KNEE - COMPLETE 4+ VIEW COMPARISON:  07/28/2011 FINDINGS: There is a large joint effusion identified. Degenerative changes are noted in all 3 joint compartments but worst in the lateral joint compartment. No fracture is noted. IMPRESSION: Degenerative change and large joint effusion. No acute bony abnormality is seen. Electronically Signed   By: Inez Catalina M.D.   On: 02/07/2016 19:33   I have personally reviewed and evaluated these images and lab results as part of my medical decision-making.   EKG Interpretation None  MDM   Final diagnoses:  Knee joint effusion, right    82 year old female presents to the ED for evaluation of right knee pain and swelling x 1 week. Symptoms consistent with likely joint effusion secondary to osteoarthritis. Effusion fluid sent for culture, though initial tests do not suggest septic arthritis. Patient has been advised to follow-up with her orthopedist for further evaluation of her symptoms. Return precautions discussed and provided. Patient discharged in satisfactory condition with no unaddressed  concerns.   Filed Vitals:   02/07/16 2200 02/07/16 2230 02/07/16 2300 02/07/16 2330  BP: 140/81 140/73 140/72 131/81  Pulse: 87 81 80 82  Temp:      TempSrc:      Resp:    18  SpO2: 98% 98% 99% 99%     Antonietta Breach, PA-C 02/07/16 2358  Virgel Manifold, MD 02/12/16 [phone]

## 2016-02-07 NOTE — ED Notes (Signed)
Pt reports recent flu symptoms which are improving but also having right knee pain for several days and mild swelling.

## 2016-02-07 NOTE — Discharge Instructions (Signed)
Knee Effusion °Knee effusion means that you have excess fluid in your knee joint. This can cause pain and swelling in your knee. This may make your knee more difficult to bend and move. That is because there is increased pain and pressure in the joint. If there is fluid in your knee, it often means that something is wrong inside your knee, such as severe arthritis, abnormal inflammation, or an infection. Another common cause of knee effusion is an injury to the knee muscles, ligaments, or cartilage. °HOME CARE INSTRUCTIONS °· Use crutches as directed by your health care provider. °· Wear a knee brace as directed by your health care provider. °· Apply ice to the swollen area: °· Put ice in a plastic bag. °· Place a towel between your skin and the bag. °· Leave the ice on for 20 minutes, 2-3 times per day. °· Keep your knee raised (elevated) when you are sitting or lying down. °· Take medicines only as directed by your health care provider. °· Do any rehabilitation or strengthening exercises as directed by your health care provider. °· Rest your knee as directed by your health care provider. You may start doing your normal activities again when your health care provider approves.    °· Keep all follow-up visits as directed by your health care provider. This is important. °SEEK MEDICAL CARE IF: °· You have ongoing (persistent) pain in your knee. °SEEK IMMEDIATE MEDICAL CARE IF: °· You have increased swelling or redness of your knee. °· You have severe pain in your knee. °· You have a fever. °  °This information is not intended to replace advice given to you by your health care provider. Make sure you discuss any questions you have with your health care provider. °  °Document Released: 01/29/2004 Document Revised: 11/29/2014 Document Reviewed: 06/24/2014 °Elsevier Interactive Patient Education ©2016 [employer] °Osteoarthritis °Osteoarthritis is a disease that causes soreness and inflammation of a joint. It occurs  when the cartilage at the affected joint wears down. Cartilage acts as a cushion, covering the ends of bones where they meet to form a joint. Osteoarthritis is the most common form of arthritis. It often occurs in older people. The joints affected most often by this condition include those in the: °· Ends of the fingers. °· Thumbs. °· Neck. °· Lower back. °· Knees. °· Hips. °CAUSES  °Over time, the cartilage that covers the ends of bones begins to wear away. This causes bone to rub on bone, producing pain and stiffness in the affected joints.  °RISK FACTORS °Certain factors can increase your chances of having osteoarthritis, including: °· Older age. °· Excessive body weight. °· Overuse of joints. °· Previous joint injury. °SIGNS AND SYMPTOMS  °· Pain, swelling, and stiffness in the joint. °· Over time, the joint may lose its normal shape. °· Small deposits of bone (osteophytes) may grow on the edges of the joint. °· Bits of bone or cartilage can break off and float inside the joint space. This may cause more pain and damage. °DIAGNOSIS  °Your health care provider will do a physical exam and ask about your symptoms. Various tests may be ordered, such as: °· X-rays of the affected joint. °· Blood tests to rule out other types of arthritis. °Additional tests may be used to diagnose your condition. °TREATMENT  °Goals of treatment are to control pain and improve joint function. Treatment plans may include: °· A prescribed exercise program that allows for rest and joint relief. °· A weight control   plan. °· Pain relief techniques, such as: °¨ Properly applied heat and cold. °¨ Electric pulses delivered to nerve endings under the skin (transcutaneous electrical nerve stimulation [TENS]). °¨ Massage. °¨ Certain nutritional supplements. °· Medicines to control pain, such as: °¨ Acetaminophen. °¨ Nonsteroidal anti-inflammatory drugs (NSAIDs), such as naproxen. °¨ Narcotic or central-acting agents, such as  tramadol. °¨ Corticosteroids. These can be given orally or as an injection. °· Surgery to reposition the bones and relieve pain (osteotomy) or to remove loose pieces of bone and cartilage. Joint replacement may be needed in advanced states of osteoarthritis. °HOME CARE INSTRUCTIONS  °· Take medicines only as directed by your health care provider. °· Maintain a healthy weight. Follow your health care provider's instructions for weight control. This may include dietary instructions. °· Exercise as directed. Your health care provider can recommend specific types of exercise. These may include: °¨ Strengthening exercises. These are done to strengthen the muscles that support joints affected by arthritis. They can be performed with weights or with exercise bands to add resistance. °¨ Aerobic activities. These are exercises, such as brisk walking or low-impact aerobics, that get your heart pumping. °¨ Range-of-motion activities. These keep your joints limber. °¨ Balance and agility exercises. These help you maintain daily living skills. °· Rest your affected joints as directed by your health care provider. °· Keep all follow-up visits as directed by your health care provider. °SEEK MEDICAL CARE IF:  °· Your skin turns red. °· You develop a rash in addition to your joint pain. °· You have worsening joint pain. °· You have a fever along with joint or muscle aches. °SEEK IMMEDIATE MEDICAL CARE IF: °· You have a significant loss of weight or appetite. °· You have night sweats. °FOR MORE INFORMATION  °· National Institute of Arthritis and Musculoskeletal and Skin Diseases: www.niams.nih.gov °· National Institute on Aging: www.nia.nih.gov °· American College of Rheumatology: www.rheumatology.org °  °This information is not intended to replace advice given to you by your health care provider. Make sure you discuss any questions you have with your health care provider. °  °Document Released: 11/08/2005 Document Revised:  11/29/2014 Document Reviewed: 07/16/2013 °Elsevier Interactive Patient Education ©2016 [employer] ° °

## 2016-02-08 LAB — GRAM STAIN: GRAM STAIN: NONE SEEN

## 2016-02-09 LAB — PATHOLOGIST SMEAR REVIEW

## 2016-02-12 LAB — CULTURE, BODY FLUID W GRAM STAIN -BOTTLE

## 2016-02-12 LAB — CULTURE, BODY FLUID-BOTTLE: Culture: NO GROWTH

## 2020-02-22 ENCOUNTER — Other Ambulatory Visit: Payer: Self-pay | Admitting: Family Medicine

## 2020-02-22 DIAGNOSIS — M85852 Other specified disorders of bone density and structure, left thigh: Secondary | ICD-10-CM

## 2020-02-25 ENCOUNTER — Other Ambulatory Visit: Payer: Medicare Other

## 2020-10-03 ENCOUNTER — Other Ambulatory Visit: Payer: Self-pay | Admitting: Family Medicine

## 2020-10-03 DIAGNOSIS — M85852 Other specified disorders of bone density and structure, left thigh: Secondary | ICD-10-CM

## 2021-03-03 DIAGNOSIS — Z1389 Encounter for screening for other disorder: Secondary | ICD-10-CM | POA: Diagnosis not present

## 2021-03-03 DIAGNOSIS — E559 Vitamin D deficiency, unspecified: Secondary | ICD-10-CM | POA: Diagnosis not present

## 2021-03-03 DIAGNOSIS — F439 Reaction to severe stress, unspecified: Secondary | ICD-10-CM | POA: Diagnosis not present

## 2021-03-03 DIAGNOSIS — E782 Mixed hyperlipidemia: Secondary | ICD-10-CM | POA: Diagnosis not present

## 2021-03-03 DIAGNOSIS — I10 Essential (primary) hypertension: Secondary | ICD-10-CM | POA: Diagnosis not present

## 2021-03-03 DIAGNOSIS — R519 Headache, unspecified: Secondary | ICD-10-CM | POA: Diagnosis not present

## 2021-03-22 ENCOUNTER — Emergency Department (HOSPITAL_COMMUNITY): Payer: Medicare HMO

## 2021-03-22 ENCOUNTER — Encounter (HOSPITAL_COMMUNITY): Payer: Self-pay | Admitting: Emergency Medicine

## 2021-03-22 ENCOUNTER — Other Ambulatory Visit: Payer: Self-pay

## 2021-03-22 ENCOUNTER — Emergency Department (HOSPITAL_COMMUNITY)
Admission: EM | Admit: 2021-03-22 | Discharge: 2021-03-22 | Disposition: A | Discharge status: Home / Self Care | Payer: Medicare HMO | Attending: Emergency Medicine | Admitting: Emergency Medicine

## 2021-03-22 DIAGNOSIS — I10 Essential (primary) hypertension: Secondary | ICD-10-CM | POA: Insufficient documentation

## 2021-03-22 DIAGNOSIS — T465X5A Adverse effect of other antihypertensive drugs, initial encounter: Secondary | ICD-10-CM | POA: Diagnosis not present

## 2021-03-22 DIAGNOSIS — T50905A Adverse effect of unspecified drugs, medicaments and biological substances, initial encounter: Secondary | ICD-10-CM

## 2021-03-22 DIAGNOSIS — J392 Other diseases of pharynx: Secondary | ICD-10-CM

## 2021-03-22 DIAGNOSIS — G9389 Other specified disorders of brain: Secondary | ICD-10-CM | POA: Diagnosis not present

## 2021-03-22 DIAGNOSIS — C119 Malignant neoplasm of nasopharynx, unspecified: Secondary | ICD-10-CM | POA: Diagnosis not present

## 2021-03-22 DIAGNOSIS — T887XXA Unspecified adverse effect of drug or medicament, initial encounter: Secondary | ICD-10-CM | POA: Insufficient documentation

## 2021-03-22 DIAGNOSIS — Z79899 Other long term (current) drug therapy: Secondary | ICD-10-CM | POA: Insufficient documentation

## 2021-03-22 DIAGNOSIS — R42 Dizziness and giddiness: Secondary | ICD-10-CM

## 2021-03-22 LAB — URINALYSIS, ROUTINE W REFLEX MICROSCOPIC
Bacteria, UA: NONE SEEN
Bilirubin Urine: NEGATIVE
Glucose, UA: NEGATIVE mg/dL
Ketones, ur: NEGATIVE mg/dL
Leukocytes,Ua: NEGATIVE
Nitrite: NEGATIVE
Protein, ur: NEGATIVE mg/dL
Specific Gravity, Urine: 1.008 (ref 1.005–1.030)
pH: 7 (ref 5.0–8.0)

## 2021-03-22 LAB — COMPREHENSIVE METABOLIC PANEL
ALT: 20 U/L (ref 0–44)
AST: 27 U/L (ref 15–41)
Albumin: 2.9 g/dL — ABNORMAL LOW (ref 3.5–5.0)
Alkaline Phosphatase: 88 U/L (ref 38–126)
Anion gap: 10 (ref 5–15)
BUN: 11 mg/dL (ref 8–23)
CO2: 23 mmol/L (ref 22–32)
Calcium: 8.1 mg/dL — ABNORMAL LOW (ref 8.9–10.3)
Chloride: 102 mmol/L (ref 98–111)
Creatinine, Ser: 0.61 mg/dL (ref 0.44–1.00)
GFR, Estimated: >60 mL/min (ref >60)
Glucose, Bld: 119 mg/dL — ABNORMAL HIGH (ref 70–99)
Potassium: 3 mmol/L — ABNORMAL LOW (ref 3.5–5.1)
Sodium: 135 mmol/L (ref 135–145)
Total Bilirubin: 0.6 mg/dL (ref 0.3–1.2)
Total Protein: 7 g/dL (ref 6.5–8.1)

## 2021-03-22 LAB — CBC
HCT: 37.5 % (ref 36.0–46.0)
Hemoglobin: 12 g/dL (ref 12.0–15.0)
MCH: 22.5 pg — ABNORMAL LOW (ref 26.0–34.0)
MCHC: 32 g/dL (ref 30.0–36.0)
MCV: 70.4 fL — ABNORMAL LOW (ref 80.0–100.0)
Platelets: 314 10*3/uL (ref 150–400)
RBC: 5.33 MIL/uL — ABNORMAL HIGH (ref 3.87–5.11)
RDW: 15.3 % (ref 11.5–15.5)
WBC: 10.3 10*3/uL (ref 4.0–10.5)
nRBC: 0 % (ref 0.0–0.2)

## 2021-03-22 LAB — DIFFERENTIAL
Abs Immature Granulocytes: 0.02 10*3/uL (ref 0.00–0.07)
Basophils Absolute: 0 10*3/uL (ref 0.0–0.1)
Basophils Relative: 0 %
Eosinophils Absolute: 0 10*3/uL (ref 0.0–0.5)
Eosinophils Relative: 0 %
Immature Granulocytes: 0 %
Lymphocytes Relative: 14 %
Lymphs Abs: 1.4 10*3/uL (ref 0.7–4.0)
Monocytes Absolute: 0.8 10*3/uL (ref 0.1–1.0)
Monocytes Relative: 7 %
Neutro Abs: 8.1 10*3/uL — ABNORMAL HIGH (ref 1.7–7.7)
Neutrophils Relative %: 79 %

## 2021-03-22 LAB — CBG MONITORING, ED: Glucose-Capillary: 143 mg/dL — ABNORMAL HIGH (ref 70–99)

## 2021-03-22 LAB — RAPID URINE DRUG SCREEN, HOSP PERFORMED
Amphetamines: NOT DETECTED
Barbiturates: NOT DETECTED
Benzodiazepines: NOT DETECTED
Cocaine: NOT DETECTED
Opiates: NOT DETECTED
Tetrahydrocannabinol: NOT DETECTED

## 2021-03-22 LAB — APTT: aPTT: 25 seconds (ref 24–36)

## 2021-03-22 LAB — PROTIME-INR
INR: 1 (ref 0.8–1.2)
Prothrombin Time: 12.7 seconds (ref 11.4–15.2)

## 2021-03-22 NOTE — ED Provider Notes (Signed)
[facility] DEPT Provider Note   CSN: [id number] Arrival date & time: 03/22/21  1805     History Chief Complaint  Patient presents with  . Dizziness    [name] is a 86 y.o. female.  HPI     87 year old female with history of hypertension, hyperlipidemia comes in a chief complaint of dizziness.  Patient reports that she woke up feeling dizzy today.  When she checked her BP, it was high.  Her son asked her to take additional blood pressure medication dose which she did.  Her symptoms since has gotten better, but she is still feeling slightly dizzy.  Dizziness is described as lightheadedness and not spinning sensation.  She does not feel like he is Passed out.  No new numbness, tingling.  No history of seizures and patient denies any chest pain, headaches.  I have attempted to call patient's contact, Ms. Eulogia Dismore on 2 different occasions and the phone go straight to the voicemail.  Past Medical History:  Diagnosis Date  . Anemia   . Arthritis    knees   . GERD (gastroesophageal reflux disease)   . Hiatal hernia   . History of inguinal hernia repair    BIH  . Hyperlipidemia   . Hypertension     Patient Active Problem List   Diagnosis Date Noted  . Bilateral inguinal hernia 10/13/2011    Past Surgical History:  Procedure Laterality Date  . COLONOSCOPY    . HERNIA REPAIR  10/13/11   lap BIH rep w/ mesh- Dr. Dalbert Batman   . INGUINAL HERNIA REPAIR  10/13/2011   Procedure: LAPAROSCOPIC INGUINAL HERNIA;  Surgeon: Adin Hector, MD;  Location: [facility];  Service: General;  Laterality: Bilateral;  Laparoscopic bilateral inguinal hernia repair, converted to open bilateral inguinal hernia repairs with mesh     OB History   No obstetric history on file.     Family History  Problem Relation Age of Onset  . Heart disease Mother     Social History   Tobacco Use  . Smoking status: Never Smoker  . Smokeless tobacco: Never Used   Substance Use Topics  . Alcohol use: No  . Drug use: No    Home Medications Prior to Admission medications   Medication Sig Start Date End Date Taking? Authorizing Provider  acetaminophen (TYLENOL) 500 MG tablet Take 1,000 mg by mouth every 6 (six) hours as needed for mild pain or fever.    [provider]  atorvastatin (LIPITOR) 80 MG tablet Take 80 mg by mouth every morning.     [provider]  ezetimibe (ZETIA) 10 MG tablet Take 10 mg by mouth every morning.     [provider]  ferrous sulfate 325 (65 FE) MG tablet Take 325 mg by mouth daily with breakfast.    [provider]  Multiple Vitamins-Minerals (EYE VITAMINS PO) Take 1 tablet by mouth every morning.    [provider]  predniSONE (DELTASONE) 20 MG tablet Take 2 tablets (40 mg total) by mouth daily. Take 40 mg by mouth daily for 3 days, then 20mg  by mouth daily for 3 days, then 10mg  daily for 3 days 02/07/16   Antonietta Breach, PA-C  pseudoephedrine-guaifenesin [facility] D) 60-600 MG 12 hr tablet Take 1 tablet by mouth 2 (two) times daily as needed for congestion.    [provider]  traMADol (ULTRAM) 50 MG tablet Take 1 tablet (50 mg total) by mouth every 6 (six) hours  as needed for severe pain. 02/07/16   Antonietta Breach, PA-C  triamterene-hydrochlorothiazide (MAXZIDE-25) 37.5-25 MG per tablet Take 1 tablet by mouth every morning.     [provider]    Allergies    Patient has no known allergies.  Review of Systems   Review of Systems  Constitutional: Positive for activity change.  Neurological: Positive for dizziness.  All other systems reviewed and are negative.   Physical Exam Updated Vital Signs BP 134/78   Pulse 82   Temp 98.9 F (37.2 C) (Oral)   Resp 18   SpO2 99%   Physical Exam Vitals and nursing note reviewed.  Constitutional:      Appearance: She is well-developed.  HENT:     Head: Normocephalic and atraumatic.  Cardiovascular:     Rate and  Rhythm: Normal rate.  Pulmonary:     Effort: Pulmonary effort is normal.  Abdominal:     General: Bowel sounds are normal.  Musculoskeletal:        General: No swelling or tenderness.     Cervical back: Normal range of motion and neck supple.  Skin:    General: Skin is warm and dry.  Neurological:     Mental Status: She is alert and oriented to person, place, and time.     Cranial Nerves: No cranial nerve deficit.     Sensory: No sensory deficit.     Motor: No weakness.     Coordination: Coordination normal.     Gait: Gait abnormal.     Comments: Unsteady gait     ED Results / Procedures / Treatments   Labs (all labs ordered are listed, but only abnormal results are displayed) Labs Reviewed  CBC - Abnormal; Notable for the following components:      Result Value   RBC 5.33 (*)    MCV 70.4 (*)    MCH 22.5 (*)    All other components within normal limits  DIFFERENTIAL - Abnormal; Notable for the following components:   Neutro Abs 8.1 (*)    All other components within normal limits  URINALYSIS, ROUTINE W REFLEX MICROSCOPIC - Abnormal; Notable for the following components:   Hgb urine dipstick SMALL (*)    All other components within normal limits  CBG MONITORING, ED - Abnormal; Notable for the following components:   Glucose-Capillary 143 (*)    All other components within normal limits  PROTIME-INR  APTT  RAPID URINE DRUG SCREEN, HOSP PERFORMED  COMPREHENSIVE METABOLIC PANEL    EKG None  Radiology CT HEAD WO CONTRAST  Result Date: 03/22/2021 CLINICAL DATA:  Hypertension, dizziness EXAM: CT HEAD WITHOUT CONTRAST TECHNIQUE: Contiguous axial images were obtained from the base of the skull through the vertex without intravenous contrast. COMPARISON:  08/20/2014 FINDINGS: Brain: No acute infarct or hemorrhage. Lateral ventricles and midline structures are unremarkable. No acute extra-axial fluid collections. No mass effect. Vascular: No hyperdense vessel or unexpected  calcification. Skull: Normal. Negative for fracture or focal lesion. Sinuses/Orbits: There is opacification of the left maxillary and ethmoid sinuses. Other: None. IMPRESSION: 1. No acute intracranial process. 2. Left maxillary and ethmoid sinus disease. Electronically Signed   By: Randa Ngo M.D.   On: 03/22/2021 20:22   MR BRAIN WO CONTRAST  Result Date: 03/22/2021 CLINICAL DATA:  Initial evaluation for neuro deficit, stroke suspected. EXAM: MRI HEAD WITHOUT CONTRAST TECHNIQUE: Multiplanar, multiecho pulse sequences of the brain and surrounding structures were obtained without intravenous contrast. COMPARISON:  Prior head CT from earlier  the same day. FINDINGS: Brain: Cerebral volume within normal limits for age. Scattered patchy T2/FLAIR hyperintensity within the periventricular and deep white matter both cerebral hemispheres, most likely related chronic microvascular ischemic disease, mild in nature. Mild patchy involvement of the pons noted. Small focus of encephalomalacia at the anterior left temporal lobe noted, likely posttraumatic. No abnormal foci of restricted diffusion to suggest acute or subacute ischemia. Gray-white matter differentiation maintained. No encephalomalacia to suggest chronic cortical infarction. No evidence for acute or chronic intracranial hemorrhage. No mass lesion within the brain itself. No mass effect or midline shift. No hydrocephalus or extra-axial fluid collection. Pituitary gland suprasellar region within normal limits. Midline structures intact. Vascular: Major intracranial vascular flow voids are maintained. Skull and upper cervical spine: Craniocervical junction within normal limits. Multilevel degenerative spondylosis noted within the visualized upper cervical spine without high-grade spinal stenosis. Sinuses/Orbits: Globes and orbital soft tissues within normal limits. Chronic left maxillary and ethmoidal sinusitis. Other: There is a heterogeneous lobulated mass  positioned at the left nasopharynx measuring approximately 3.2 x 2.0 x 3.5 cm (transverse by AP by craniocaudad). Lesion is centered near the fossa of Rosenmuller. No visible significant extension into the adjacent left parapharyngeal space on this exam. No visible erosive changes of the clivus or skull base. No associated mastoid or middle ear effusion. IMPRESSION: 1. No acute intracranial abnormality. 2. 3.2 x 2.0 x 3.5 cm heterogeneous mass positioned at the left nasopharynx near the fossa of Rosenmuller. 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 ENT consultation and referral recommended. 3. Chronic left maxillary and ethmoidal sinusitis. 4. Mild chronic microvascular ischemic disease. Electronically Signed   By: Jeannine Boga M.D.   On: 03/22/2021 22:33    Procedures Procedures   Medications Ordered in ED Medications - No data to display  ED Course  I have reviewed the triage vital signs and the nursing notes.  Pertinent labs & imaging results that were available during my care of the patient were reviewed by me and considered in my medical decision making (see chart for details).  Clinical Course as of 03/22/21 2308  Nancy Fetter Mar 22, 2021  2307 Patient reassessed.  She has now ambulated without dizziness and without any unsteadiness. MRI results reviewed with her.  She is made aware that there is no stroke but there is a nasopharyngeal mass that will need further investigation.  Granddaughter is at the bedside.  She is also aware that patient will need to follow-up with ENT doctors to ensure proper diagnosis is reached. [AN]    Clinical Course User Index [AN] Varney Biles, MD   MDM Rules/Calculators/A&P                          87 year old female comes in with chief complaint of dizziness. She has history of hypertension and hyperlipidemia.  She is supposed to be on amlodipine and Maxide for her BP management.  She states that she  took double the dose today at the recommendation of her son.  However, it appears that she was dizzy even before she took the second dose, and that she took the second dose because she had elevated blood pressure.  I tried to reach patient's home contact to get some clarity, but was unable to reach them.  On exam patient is having ataxia.  Her BP is slightly low.  She is reporting that she is better now than she was at home.  It appears that  she was last normal at last night, she woke up with some dizziness.  Plan is to initiate stroke work-up.  Final Clinical Impression(s) / ED Diagnoses Final diagnoses:  Dizziness  Medication side effect, initial encounter  Nasopharyngeal mass    Rx / DC Orders ED Discharge Orders    None       Varney Biles, MD 03/22/21 2308

## 2021-03-22 NOTE — ED Triage Notes (Signed)
Patient reports her son told her to take an extra dose of her HTN medication because her BP was elevated. She states she feels dizzy after taking it. Denies any other symptoms.

## 2021-03-22 NOTE — Discharge Instructions (Addendum)
We saw in the ER for dizziness.  It is unclear why you are dizzy when you got up and remained dizzy before you came to the ER.  The MRI did not reveal a stroke, but it did reveal that you have a mass in your face which will need evaluation by the ENT doctors.  Please call the ENT doctors for close follow-up.  Hydrate well and make sure that you take your medications as prescribed and nothing additional.

## 2021-03-22 NOTE — ED Notes (Signed)
Patient walked to the bathroom

## 2021-03-22 NOTE — ED Notes (Signed)
Discharged no concerns 

## 2021-03-22 NOTE — ED Notes (Signed)
Orthostatic vitals have been completed

## 2021-04-01 DIAGNOSIS — J343 Hypertrophy of nasal turbinates: Secondary | ICD-10-CM | POA: Diagnosis not present

## 2021-04-01 DIAGNOSIS — J3489 Other specified disorders of nose and nasal sinuses: Secondary | ICD-10-CM | POA: Diagnosis not present

## 2021-04-01 DIAGNOSIS — J392 Other diseases of pharynx: Secondary | ICD-10-CM | POA: Diagnosis not present

## 2021-04-06 ENCOUNTER — Other Ambulatory Visit (HOSPITAL_COMMUNITY)
Admission: RE | Admit: 2021-04-06 | Discharge: 2021-04-06 | Disposition: A | Discharge status: Home / Self Care | Payer: Medicare HMO | Source: Ambulatory Visit | Attending: Otolaryngology | Admitting: Otolaryngology

## 2021-04-06 ENCOUNTER — Other Ambulatory Visit: Payer: Self-pay | Admitting: Otolaryngology

## 2021-04-06 DIAGNOSIS — Z01812 Encounter for preprocedural laboratory examination: Secondary | ICD-10-CM | POA: Diagnosis not present

## 2021-04-06 DIAGNOSIS — Z20822 Contact with and (suspected) exposure to covid-19: Secondary | ICD-10-CM | POA: Insufficient documentation

## 2021-04-07 ENCOUNTER — Encounter (HOSPITAL_COMMUNITY): Payer: Self-pay | Admitting: Otolaryngology

## 2021-04-07 LAB — SARS CORONAVIRUS 2 (TAT 6-24 HRS): SARS Coronavirus 2: NEGATIVE

## 2021-04-07 NOTE — Progress Notes (Signed)
I spoke to Ms [name] , who gave me permission to speak to  Her granddaughter- [name].  Ms [name] denies chest pain or shortness of breath. Patient was tested for Covid and has been in quarantine since that time.  Ms [name] has some memory issues, patient has not been diagnosed with dementia as of this time, per granddaughter. [name]  Will be with Ms [name] tomorrow.

## 2021-04-08 ENCOUNTER — Encounter (HOSPITAL_COMMUNITY): Payer: Self-pay | Admitting: Otolaryngology

## 2021-04-08 ENCOUNTER — Ambulatory Visit (HOSPITAL_COMMUNITY)
Admission: RE | Admit: 2021-04-08 | Discharge: 2021-04-08 | Disposition: A | Discharge status: Home / Self Care | Payer: Medicare HMO | Attending: Otolaryngology | Admitting: Otolaryngology

## 2021-04-08 ENCOUNTER — Encounter (HOSPITAL_COMMUNITY)
Admission: RE | Disposition: A | Discharge status: Home / Self Care | Payer: Self-pay | Source: Home / Self Care | Attending: Otolaryngology

## 2021-04-08 ENCOUNTER — Ambulatory Visit (HOSPITAL_COMMUNITY): Payer: Medicare HMO | Admitting: Anesthesiology

## 2021-04-08 DIAGNOSIS — C8331 Diffuse large B-cell lymphoma, lymph nodes of head, face, and neck: Secondary | ICD-10-CM | POA: Diagnosis not present

## 2021-04-08 DIAGNOSIS — Z79899 Other long term (current) drug therapy: Secondary | ICD-10-CM | POA: Diagnosis not present

## 2021-04-08 DIAGNOSIS — Z681 Body mass index (BMI) 19 or less, adult: Secondary | ICD-10-CM | POA: Insufficient documentation

## 2021-04-08 DIAGNOSIS — E785 Hyperlipidemia, unspecified: Secondary | ICD-10-CM | POA: Diagnosis not present

## 2021-04-08 DIAGNOSIS — R630 Anorexia: Secondary | ICD-10-CM | POA: Diagnosis not present

## 2021-04-08 DIAGNOSIS — K449 Diaphragmatic hernia without obstruction or gangrene: Secondary | ICD-10-CM | POA: Diagnosis not present

## 2021-04-08 DIAGNOSIS — R1310 Dysphagia, unspecified: Secondary | ICD-10-CM | POA: Diagnosis not present

## 2021-04-08 DIAGNOSIS — J392 Other diseases of pharynx: Secondary | ICD-10-CM | POA: Diagnosis not present

## 2021-04-08 DIAGNOSIS — I10 Essential (primary) hypertension: Secondary | ICD-10-CM | POA: Insufficient documentation

## 2021-04-08 DIAGNOSIS — K4020 Bilateral inguinal hernia, without obstruction or gangrene, not specified as recurrent: Secondary | ICD-10-CM | POA: Diagnosis not present

## 2021-04-08 HISTORY — DX: Other amnesia: R41.3

## 2021-04-08 HISTORY — PX: NASAL ENDOSCOPY: SHX6577

## 2021-04-08 LAB — BASIC METABOLIC PANEL
Anion gap: 12 (ref 5–15)
BUN: 19 mg/dL (ref 8–23)
CO2: 28 mmol/L (ref 22–32)
Calcium: 9.2 mg/dL (ref 8.9–10.3)
Chloride: 98 mmol/L (ref 98–111)
Creatinine, Ser: 0.67 mg/dL (ref 0.44–1.00)
GFR, Estimated: >60 mL/min (ref >60)
Glucose, Bld: 97 mg/dL (ref 70–99)
Potassium: 2.9 mmol/L — ABNORMAL LOW (ref 3.5–5.1)
Sodium: 138 mmol/L (ref 135–145)

## 2021-04-08 LAB — CBC
HCT: 30.2 % — ABNORMAL LOW (ref 36.0–46.0)
Hemoglobin: 9.4 g/dL — ABNORMAL LOW (ref 12.0–15.0)
MCH: 22.3 pg — ABNORMAL LOW (ref 26.0–34.0)
MCHC: 31.1 g/dL (ref 30.0–36.0)
MCV: 71.6 fL — ABNORMAL LOW (ref 80.0–100.0)
Platelets: 234 10*3/uL (ref 150–400)
RBC: 4.22 MIL/uL (ref 3.87–5.11)
RDW: 15.7 % — ABNORMAL HIGH (ref 11.5–15.5)
WBC: 7.1 10*3/uL (ref 4.0–10.5)
nRBC: 0 % (ref 0.0–0.2)

## 2021-04-08 SURGERY — ENDOSCOPY, NOSE
Anesthesia: General | Site: Nose | Laterality: Left

## 2021-04-08 MED ORDER — LIDOCAINE 2% (20 MG/ML) 5 ML SYRINGE
INTRAMUSCULAR | Status: DC | PRN
  Administered 2021-04-08: 80 mg via INTRAVENOUS

## 2021-04-08 MED ORDER — OXYMETAZOLINE HCL 0.05 % NA SOLN
NASAL | Status: AC
  Filled 2021-04-08: qty 30

## 2021-04-08 MED ORDER — HEMOSTATIC AGENTS (NO CHARGE) OPTIME
TOPICAL | Status: DC | PRN
  Administered 2021-04-08: 1 via TOPICAL

## 2021-04-08 MED ORDER — DEXAMETHASONE SODIUM PHOSPHATE 10 MG/ML IJ SOLN
INTRAMUSCULAR | Status: DC | PRN
  Administered 2021-04-08: 10 mg via INTRAVENOUS

## 2021-04-08 MED ORDER — LIDOCAINE-EPINEPHRINE 1 %-1:100000 IJ SOLN
INTRAMUSCULAR | Status: AC
  Filled 2021-04-08: qty 1

## 2021-04-08 MED ORDER — ROCURONIUM BROMIDE 100 MG/10ML IV SOLN
INTRAVENOUS | Status: DC | PRN
  Administered 2021-04-08: 30 mg via INTRAVENOUS

## 2021-04-08 MED ORDER — SILVER NITRATE-POT NITRATE 75-25 % EX MISC
CUTANEOUS | Status: AC
  Filled 2021-04-08: qty 10

## 2021-04-08 MED ORDER — LACTATED RINGERS IV SOLN: INTRAVENOUS | Status: DC

## 2021-04-08 MED ORDER — LIDOCAINE 2% (20 MG/ML) 5 ML SYRINGE
INTRAMUSCULAR | Status: AC
  Filled 2021-04-08: qty 5

## 2021-04-08 MED ORDER — ONDANSETRON HCL 4 MG/2ML IJ SOLN
INTRAMUSCULAR | Status: DC | PRN
  Administered 2021-04-08: 4 mg via INTRAVENOUS

## 2021-04-08 MED ORDER — LIDOCAINE HCL (PF) 1 % IJ SOLN
INTRAMUSCULAR | Status: AC
  Filled 2021-04-08: qty 10

## 2021-04-08 MED ORDER — LIDOCAINE-EPINEPHRINE 1 %-1:100000 IJ SOLN
INTRAMUSCULAR | Status: DC | PRN
  Administered 2021-04-08: 4 mL

## 2021-04-08 MED ORDER — ROCURONIUM BROMIDE 10 MG/ML (PF) SYRINGE
PREFILLED_SYRINGE | INTRAVENOUS | Status: AC
  Filled 2021-04-08: qty 10

## 2021-04-08 MED ORDER — FENTANYL CITRATE (PF) 100 MCG/2ML IJ SOLN
INTRAMUSCULAR | Status: DC | PRN
  Administered 2021-04-08 (×3): 25 ug via INTRAVENOUS

## 2021-04-08 MED ORDER — BACITRACIN ZINC 500 UNIT/GM EX OINT
TOPICAL_OINTMENT | CUTANEOUS | Status: AC
  Filled 2021-04-08: qty 28.35

## 2021-04-08 MED ORDER — PROPOFOL 10 MG/ML IV BOLUS
INTRAVENOUS | Status: DC | PRN
  Administered 2021-04-08: 80 mg via INTRAVENOUS

## 2021-04-08 MED ORDER — OXYMETAZOLINE HCL 0.05 % NA SOLN
NASAL | Status: DC | PRN
  Administered 2021-04-08: 1 via TOPICAL

## 2021-04-08 MED ORDER — ONDANSETRON HCL 4 MG/2ML IJ SOLN
INTRAMUSCULAR | Status: AC
  Filled 2021-04-08: qty 2

## 2021-04-08 MED ORDER — CHLORHEXIDINE GLUCONATE 0.12 % MT SOLN
OROMUCOSAL | Status: AC
  Administered 2021-04-08: 15 mL via OROMUCOSAL
  Filled 2021-04-08: qty 15

## 2021-04-08 MED ORDER — ONDANSETRON HCL 4 MG/2ML IJ SOLN: 4.0000 mg | Freq: Once | INTRAMUSCULAR | Status: DC | PRN

## 2021-04-08 MED ORDER — 0.9 % SODIUM CHLORIDE (POUR BTL) OPTIME
TOPICAL | Status: DC | PRN
  Administered 2021-04-08: 1000 mL

## 2021-04-08 MED ORDER — FENTANYL CITRATE (PF) 250 MCG/5ML IJ SOLN
INTRAMUSCULAR | Status: AC
  Filled 2021-04-08: qty 5

## 2021-04-08 MED ORDER — CHLORHEXIDINE GLUCONATE 0.12 % MT SOLN: 15.0000 mL | Freq: Once | OROMUCOSAL | Status: AC

## 2021-04-08 MED ORDER — TRAMADOL HCL 50 MG PO TABS: 50.0000 mg | ORAL_TABLET | Freq: Four times a day (QID) | ORAL | 0 refills | Status: AC | PRN

## 2021-04-08 MED ORDER — ACETAMINOPHEN 10 MG/ML IV SOLN: 650.0000 mg | Freq: Once | INTRAVENOUS | Status: DC | PRN

## 2021-04-08 MED ORDER — ORAL CARE MOUTH RINSE: 15.0000 mL | Freq: Once | OROMUCOSAL | Status: AC

## 2021-04-08 MED ORDER — FENTANYL CITRATE (PF) 100 MCG/2ML IJ SOLN: 25.0000 ug | INTRAMUSCULAR | Status: DC | PRN

## 2021-04-08 MED ORDER — TRAMADOL HCL 50 MG PO TABS: 50.0000 mg | ORAL_TABLET | Freq: Four times a day (QID) | ORAL | 0 refills | Status: DC | PRN

## 2021-04-08 MED ORDER — DEXAMETHASONE SODIUM PHOSPHATE 10 MG/ML IJ SOLN
INTRAMUSCULAR | Status: AC
  Filled 2021-04-08: qty 1

## 2021-04-08 MED ORDER — SUGAMMADEX SODIUM 200 MG/2ML IV SOLN
INTRAVENOUS | Status: DC | PRN
  Administered 2021-04-08: 100 mg via INTRAVENOUS

## 2021-04-08 MED ORDER — PROPOFOL 10 MG/ML IV BOLUS
INTRAVENOUS | Status: AC
  Filled 2021-04-08: qty 20

## 2021-04-08 MED ORDER — MUPIROCIN CALCIUM 2 % EX CREA
TOPICAL_CREAM | CUTANEOUS | Status: AC
  Filled 2021-04-08: qty 15

## 2021-04-08 SURGICAL SUPPLY — 22 items
COAGULATOR SUCT SWTCH 10FR 6 (ELECTROSURGICAL) ×2 IMPLANT
COVER MAYO STAND STRL (DRAPES) IMPLANT
COVER WAND RF STERILE (DRAPES) IMPLANT
DECANTER SPIKE VIAL GLASS SM (MISCELLANEOUS) IMPLANT
ELECT COATED BLADE 2.86 ST (ELECTRODE) IMPLANT
ELECT REM PT RETURN 9FT ADLT (ELECTROSURGICAL)
ELECTRODE REM PT RTRN 9FT ADLT (ELECTROSURGICAL) IMPLANT
GAUZE 4X4 16PLY RFD (DISPOSABLE) ×2 IMPLANT
GAUZE SPONGE 2X2 8PLY STRL LF (GAUZE/BANDAGES/DRESSINGS) IMPLANT
GAUZE SPONGE 4X4 12PLY STRL (GAUZE/BANDAGES/DRESSINGS) ×1 IMPLANT
GLOVE ECLIPSE 7.5 STRL STRAW (GLOVE) ×2 IMPLANT
GOWN STRL REUS W/ TWL LRG LVL3 (GOWN DISPOSABLE) IMPLANT
GOWN STRL REUS W/TWL LRG LVL3 (GOWN DISPOSABLE)
HEMOSTAT ARISTA ABSORB 3G PWDR (HEMOSTASIS) ×1 IMPLANT
KIT BASIN OR (CUSTOM PROCEDURE TRAY) IMPLANT
PATTIES SURGICAL .5 X3 (DISPOSABLE) IMPLANT
SOLUTION BUTLER CLEAR DIP (MISCELLANEOUS) IMPLANT
SPONGE GAUZE 2X2 STER 10/PKG (GAUZE/BANDAGES/DRESSINGS)
TAPE PAPER 1X10 WHT MICROPORE (GAUZE/BANDAGES/DRESSINGS) ×1 IMPLANT
TOWEL GREEN STERILE (TOWEL DISPOSABLE) IMPLANT
TRAY ENT MC OR (CUSTOM PROCEDURE TRAY) ×2 IMPLANT
YANKAUER SUCT BULB TIP NO VENT (SUCTIONS) IMPLANT

## 2021-04-08 NOTE — Anesthesia Preprocedure Evaluation (Addendum)
Anesthesia Evaluation  Patient identified by MRN, date of birth, ID band Patient awake    Reviewed: Allergy & Precautions, NPO status , Patient's Chart, lab work & pertinent test results  Airway Mallampati: II  TM Distance: >3 FB Neck ROM: Full    Dental no notable dental hx. (+) Teeth Intact, Dental Advisory Given   Pulmonary neg pulmonary ROS,    Pulmonary exam normal breath sounds clear to auscultation       Cardiovascular hypertension, Pt. on medications Normal cardiovascular exam Rhythm:Regular Rate:Normal  11/2015 Ech Ef 76%  - Left ventricle: The cavity size was normal. Wall thickness was  increased in a pattern of mild LVH. There was focal basal  hypertrophy. Doppler parameters are consistent with abnormal left  ventricular relaxation (grade 1 diastolic dysfunction).    Neuro/Psych negative neurological ROS  negative psych ROS   GI/Hepatic hiatal hernia, GERD  ,  Endo/Other  negative endocrine ROS  Renal/GU negative Renal ROS     Musculoskeletal  (+) Arthritis ,   Abdominal   Peds  Hematology Lab Results      Component                Value               Date                      WBC                      10.3                03/22/2021                HGB                      12.0                03/22/2021                HCT                      37.5                03/22/2021                MCV                      70.4 (L)            03/22/2021                PLT                      314                 03/22/2021              Anesthesia Other Findings   Reproductive/Obstetrics                            Anesthesia Physical Anesthesia Plan  ASA: III  Anesthesia Plan: General   Post-op Pain Management:    Induction: Intravenous  PONV Risk Score and Plan: 4 or greater and Treatment may vary due to age or medical condition and Ondansetron  Airway Management Planned:  Oral ETT  Additional Equipment: None  Intra-op Plan:  Post-operative Plan: Extubation in OR  Informed Consent:     Dental advisory given  Plan Discussed with:   Anesthesia Plan Comments:         Anesthesia Quick Evaluation

## 2021-04-08 NOTE — Anesthesia Procedure Notes (Signed)
Procedure Name: Intubation Date/Time: 04/08/2021 12:35 PM Performed by: Lavonia Dana, CRNA Pre-anesthesia Checklist: Patient identified, Emergency Drugs available, Suction available and Patient being monitored Patient Re-evaluated:Patient Re-evaluated prior to induction Oxygen Delivery Method: Circle system utilized Preoxygenation: Pre-oxygenation with 100% oxygen Induction Type: IV induction Ventilation: Mask ventilation without difficulty Laryngoscope Size: Miller and 2 Grade View: Grade I Tube type: Oral Tube size: 7.0 mm Number of attempts: 1 Airway Equipment and Method: Stylet and Bite block Placement Confirmation: ETT inserted through vocal cords under direct vision,  positive ETCO2 and breath sounds checked- equal and bilateral Secured at: 21 cm Tube secured with: Tape Dental Injury: Teeth and Oropharynx as per pre-operative assessment

## 2021-04-08 NOTE — H&P (Signed)
[name] is an 87 y.o. female.    Chief Complaint:  Nasopharyngeal mass  HPI: Patient presents today for planned elective procedure.  He/she denies any interval change in history since office visit on 04/01/2020:  [name] is a 87 y.o. female with past medical history of hypertension and hyperlipidemia who presents as a new patient, referred by Olen Pel, *, for evaluation and treatment of incidental finding of left-sided nasopharyngeal mass and recent MRI. Patient presented to the ED on 03/22/2021 with complaints of dizziness. Her granddaughter accompanies her to this visit. She states that she did not lose consciousness, but felt lightheaded and unsteady on her feet. She underwent work-up for suspected stroke, including a CT head and an MRI of her brain. CT head did not reveal any acute findings. MRI of her brain demonstrated a left nasopharyngeal mass lesion measuring approximately 3.2 x 2.0 x 3.5 cm centered near the fossa of Rosenmuller, with no visible extension into the adjacent left parapharyngeal space or erosive changes of the clivus or skull base. Patient did not have any previous MRI imaging for comparison. She states that she has been feeling a sense of disequilibrium for the last several months. She also endorses a significant loss of appetite, with an approximate 30 pound weight loss over the span of the last 2 to 3 months. Her granddaughter states that she eats very little. Patient states that she does experience some coughing with oral intake, but denies difficulty swallowing if she chews her food carefully. She denies any pain with swallowing. She denies any recent pneumonias. She denies fevers, chills, night sweats. She endorses a small amount of blood in her sputum with coughing. She is a non-smoker with no significant tobacco use history.    Past Medical History:  Diagnosis Date  . Anemia   . Arthritis    knees   . GERD (gastroesophageal reflux disease)   . Hiatal  hernia   . History of inguinal hernia repair    BIH  . Hyperlipidemia   . Hypertension   . Memory problem     Past Surgical History:  Procedure Laterality Date  . COLONOSCOPY    . HERNIA REPAIR  10/13/11   lap BIH rep w/ mesh- Dr. Dalbert Batman   . INGUINAL HERNIA REPAIR  10/13/2011   Procedure: LAPAROSCOPIC INGUINAL HERNIA;  Surgeon: Adin Hector, MD;  Location: [facility];  Service: General;  Laterality: Bilateral;  Laparoscopic bilateral inguinal hernia repair, converted to open bilateral inguinal hernia repairs with mesh    Family History  Problem Relation Age of Onset  . Heart disease Mother     Social History:  reports that she has never smoked. She has never used smokeless tobacco. She reports that she does not drink alcohol and does not use drugs.  Allergies: No Known Allergies  Medications Prior to Admission  Medication Sig Dispense Refill  . acetaminophen (TYLENOL) 500 MG tablet Take 1,000 mg by mouth every 6 (six) hours as needed for mild pain or fever.    Marland Kitchen atorvastatin (LIPITOR) 80 MG tablet Take 80 mg by mouth every morning.    . ezetimibe (ZETIA) 10 MG tablet Take 10 mg by mouth every morning.    . triamterene-hydrochlorothiazide (MAXZIDE-25) 37.5-25 MG tablet Take 1 tablet by mouth every morning.    . predniSONE (DELTASONE) 20 MG tablet Take 2 tablets (40 mg total) by mouth daily. Take 40 mg by mouth daily for 3 days, then 20mg  by mouth daily for  3 days, then 10mg  daily for 3 days (Patient not taking: Reported on 04/03/2021) 12 tablet 0  . traMADol (ULTRAM) 50 MG tablet Take 1 tablet (50 mg total) by mouth every 6 (six) hours as needed for severe pain. (Patient not taking: Reported on 04/03/2021) 15 tablet 0    Results for orders placed or performed during the hospital encounter of 04/08/21 (from the past 48 hour(s))  CBC     Status: Abnormal   Collection Time: 04/08/21  9:23 AM  Result Value Ref Range   WBC 7.1 4.0 - 10.5 K/uL   RBC 4.22 3.87 - 5.11 MIL/uL    Hemoglobin 9.4 (L) 12.0 - 15.0 g/dL   HCT 30.2 (L) 36.0 - 46.0 %   MCV 71.6 (L) 80.0 - 100.0 fL   MCH 22.3 (L) 26.0 - 34.0 pg   MCHC 31.1 30.0 - 36.0 g/dL   RDW 15.7 (H) 11.5 - 15.5 %   Platelets 234 150 - 400 K/uL   nRBC 0.0 0.0 - 0.2 %    Comment: Performed at [facility], [address], [place], [place] [address]  Basic metabolic panel     Status: Abnormal   Collection Time: 04/08/21  9:23 AM  Result Value Ref Range   Sodium 138 135 - 145 mmol/L   Potassium 2.9 (L) 3.5 - 5.1 mmol/L   Chloride 98 98 - 111 mmol/L   CO2 28 22 - 32 mmol/L   Glucose, Bld 97 70 - 99 mg/dL    Comment: Glucose reference range applies only to samples taken after fasting for at least 8 hours.   BUN 19 8 - 23 mg/dL   Creatinine, Ser 0.67 0.44 - 1.00 mg/dL   Calcium 9.2 8.9 - 10.3 mg/dL   GFR, Estimated >60 >60 mL/min    Comment: (NOTE) Calculated using the CKD-EPI Creatinine Equation (2021)    Anion gap 12 5 - 15    Comment: Performed at [facility] [address] [place], [place] [address]   No results found.  ROS: ROS  Blood pressure 129/69, pulse 84, temperature 98.5 F (36.9 C), temperature source Oral, resp. rate 18, height 4\' 11"  (1.499 m), weight 43.5 kg, SpO2 96 %.  PHYSICAL EXAM: General: Frail, thin female in no acute distress. Voice intact, no voice breaks  Head/Face: Normocephalic, atraumatic. No scars or lesions. No sinus tenderness. Facial nerve intact and equal bilaterally.  Eyes: Pupils are equal, round and reactive to light. Conjunctiva and lids are normal. Normal extraocular mobility.  Ears: Right: Pinna and external meatus normal Left: Pinna and external meatus normal Nose: No gross deformity or lesions. No purulent discharge. Septum relatively midline, 2+ turbinate hypertrophy.  Mouth/Oropharynx: Lips normal, teeth and gums normal with absent upper dentition, normal oral vestibule. Neck: Trachea midline.   Respiratory: No stridor or distress.  Extremities: No  edema or cyanosis. Warm and well-perfused.  Neurologic: CN II-XII intact. Alert and oriented to self, place and time. Normal reflexes and motor skills, balance and coordination.  Psychiatric: No unusual anxiety or evidence of depression. Appropriate affect.   Studies Reviewed: MRI Head and CT Head reviewed   Assessment/Plan Dublin Cantero is a 87 y.o. female history of hypertension, hyperlipidemia, no tobacco use history with incidental finding of mass lesion within the left nasopharynx on recent MRI of the brain obtained during an ED visit for dizziness. Complete head neck examination in office including nasolaryngoscopy demonstrated a large mass lesion occupying the left nasopharynx, immediately  posterior to the left eustachian tube opening concerning for malignant neoplasm. -To OR today for nasal endoscopy with biopsy. Risks, benefits, and postoperative recovery of the procedure were discussed and the patient expressed understanding and agreement. Further recommendations pending results of biopsy.    Pillow 04/08/2021, 11:51 AM

## 2021-04-08 NOTE — Anesthesia Postprocedure Evaluation (Signed)
Anesthesia Post Note  Patient: [name]  Procedure(s) Performed: NASAL ENDOSCOPY WITH BIOPSY OF LEFT NASOPPHARYNGEAL MASS (Left Nose)     Patient location during evaluation: PACU Anesthesia Type: General Level of consciousness: awake and alert Pain management: pain level controlled Vital Signs Assessment: post-procedure vital signs reviewed and stable Respiratory status: spontaneous breathing, nonlabored ventilation, respiratory function stable and patient connected to nasal cannula oxygen Cardiovascular status: blood pressure returned to baseline and stable Postop Assessment: no apparent nausea or vomiting Anesthetic complications: no   No complications documented.  Last Vitals:  Vitals:   04/08/21 1424 04/08/21 1439  BP: 126/68 116/76  Pulse: 72 75  Resp: 11 11  Temp:    SpO2: 96% 97%    Last Pain:  Vitals:   04/08/21 1439  TempSrc:   PainSc: 0-No pain                 Barnet Glasgow

## 2021-04-08 NOTE — Discharge Instructions (Signed)
Endoscopic Biopsy Care After This sheet gives you information about how to care for yourself after your procedure. Your health care provider may also give you more specific instructions. If you have problems or questions, contact your health care provider. What can I expect after the procedure? After the procedure, it is common to have:  Mild pain.  A stuffy nose (nasal congestion).  Some bloody drainage from your nose. Follow these instructions at home: Medicines  Take over-the-counter and prescription medicines only as told by your health care provider.  If you were prescribed an antibiotic medicine, take it as told by your health care provider. Do not stop taking the antibiotic even if you start to feel better.  Do not drive or use heavy machinery while taking prescription pain medicine. Nose care  If you have a gauze pad bandage (dressing) under your nose, keep this dressing clean. Change it if it gets soaked with nasal drainage.  Check your nasal dressing often for signs of thick discharge, more blood, or more discharge of clear fluid.  Follow instructions from your health care provider about using saline nasal sprays or washing out (irrigating) your nasal cavity.   Managing pain and swelling  Keep your head raised (elevated) when you are lying down. You can use a pillow to do this.  If directed, put ice on the affected area: ? Put ice in a plastic bag. ? Place a towel between your skin and the bag. ? Leave the ice on for 20 minutes, 2-3 times a day. Activity  Return to your normal activities as told by your health care provider. Ask your health care provider what activities are safe for you.  Do not lift anything that is heavier than 10 lb (4.5 kg) until your health care provider says that it is safe.  Do not bend over until your health care provider says that it is safe.  Ask your health care provider when it is safe for you to drive.  Avoid these activities as  directed by your health care provider to prevent increased pressure in the sinuses: ? Blowing your nose. ? Coughing or sneezing. If you cannot stop a sneeze, sneeze with your mouth open. ? Straining on the toilet. General instructions  Do not use hot water when taking a bath or shower. Do not swim or use a hot tub until your health care provider approves.  Do not use any products that contain nicotine or tobacco, such as cigarettes and e-cigarettes. These products can delay healing. If you need help quitting, ask your health care provider.  To prevent or treat constipation while you are taking prescription pain medicine, your health care provider may recommend that you: ? Drink enough fluid to keep your urine pale yellow. ? Take over-the-counter or prescription medicines. ? Eat foods that are high in fiber, such as fresh fruits and vegetables, whole grains, and beans. ? Limit foods that are high in fat and processed sugars, such as fried and sweet foods.  Keep all follow-up visits as told by your health care provider. This is important. You will need to return to see your surgeon about 1 week after the procedure. Contact a health care provider if:  You have: ? Chills or a fever. ? Thick, bloody, or watery discharge from your nose. ? A headache or stiff neck. ? Any change in vision.  Your pain is not controlled by your pain medicine.  You are not able to breathe through your nose after your  packing is removed. Get help right away if:  You have heavy bleeding from your nose.  You have any loss of vision. Summary  After the procedure, it is common to have mild pain, a stuffy nose (nasal congestion), and some bloody drainage from your nose.  Take over-the-counter and prescription medicines only as told by your health care provider. Follow instructions from your health care provider about using saline nasal sprays or washing out (irrigating) your nasal cavity.  Ask your health care  provider what activities are safe for you.  Do not use hot water when taking a bath or shower. Do not swim or use a hot tub until your health care provider approves. This information is not intended to replace advice given to you by your health care provider. Make sure you discuss any questions you have with your health care provider. Document Revised: 08/19/2020 Document Reviewed: 08/19/2020 Elsevier Patient Education  2021 [employer].

## 2021-04-08 NOTE — Transfer of Care (Signed)
Immediate Anesthesia Transfer of Care Note  Patient: [name]  Procedure(s) Performed: NASAL ENDOSCOPY WITH BIOPSY OF LEFT NASOPPHARYNGEAL MASS (Left Nose)  Patient Location: PACU  Anesthesia Type:General  Level of Consciousness: drowsy  Airway & Oxygen Therapy: Patient Spontanous Breathing  Post-op Assessment: Report given to RN and Post -op Vital signs reviewed and stable  Post vital signs: Reviewed and stable  Last Vitals:  Vitals Value Taken Time  BP 126/68 04/08/21 1424  Temp 36.3 C 04/08/21 1339  Pulse 78 04/08/21 1439  Resp 12 04/08/21 1439  SpO2 96 % 04/08/21 1439  Vitals shown include unvalidated device data.  Last Pain:  Vitals:   04/08/21 1409  TempSrc:   PainSc: 0-No pain         Complications: No complications documented.

## 2021-04-08 NOTE — Op Note (Signed)
OPERATIVE NOTE  [name] Date/Time of Admission: 04/08/2021  8:58 AM  CSN: [id number];MGN:[id number] Attending Provider: Ebbie Latus A, DO Room/Bed: MCPO/NONE DOB: 09/26/1932 Age: 87 y.o.   Pre-Op Diagnosis: Nasopharyngeal mass  Post-Op Diagnosis: Nasopharyngeal mass  Procedure: Procedure(s): NASAL ENDOSCOPY WITH BIOPSY OF LEFT NASOPPHARYNGEAL MASS  Anesthesia: General  Surgeon(s): Jason Coop, DO  Staff: Circulator: Agapito Games, RN; Deland Pretty, RN Scrub Person: Lois Huxley; Vonda Antigua K  Implants: * No implants in log *  Specimens: ID Type Source Tests Collected by Time Destination  1 : left nasopharyngeal mass Tissue PATH ENT biopsy SURGICAL PATHOLOGY Maris Abascal A, DO 04/08/2021 1251   2 : left nasopharyngeal mass #2 for permanent Tissue PATH ENT biopsy SURGICAL PATHOLOGY Gioia Ranes, [place], DO 04/08/2021 1259   3 : left middle turbinate lesion Tissue PATH ENT biopsy SURGICAL PATHOLOGY Shaquan Puerta A, DO 08/07/9449 3888     Complications: None  EBL: 10 ML  Condition: stable  Operative Findings:  Large mass lesion eminating from left posterior nasopharynx, emanating from the left fossa of Rosenmuller, immediately posterior to the torus tubarius, and crosses midline with no significant vascularity noted. Additional polypoid mass lesion coming from left posterior middle turbinate, separate from nasopharyngeal mass.  Frozen section pathology concerning for malignant neoplasm, no definitive diagnosis able to be determined on frozen section.   Description of Operation: Once operative consent was obtained and the site and surgery were confirmed with the patient and the operating room team, the patient was brought back to the operating room and general endotracheal anesthesia was obtained. Afrin-soaked pledgets were placed into the nasal cavity, and the patient was prepped and draped in sterile fashion. Attention was first turned  to the left nasal cavity. A zero degree endoscope was used to visualize the mass lesion. Operative photos were taken. Lidocaine 1% with 1:100,000 epinephrine was injected into the mass. Several biopsies were obtained and sent for frozen section. Additional tissue was sent as permanent specimen. A polypoid mass lesion was noted to be eminating from the posterior aspect of the left middle turbinate. This was also removed using true-cut forceps and sent to pathology as a permanent specimen. Minimal bleeding was noted from the biopsy sites, these were cauterized using suction cautery. After copious irrigation,  Arista was placed in bilateral nasal cavities. No further bleeding was appreciated. An orogastric tube was placed and the stomach cavity was suctioned to reduce postoperative nausea. The patient was turned over to anesthesia service and was extubated in the operating room and transferred to the PACU in stable condition. The patient will be discharged today and followed up in the ENT clinic for postoperative check.   Jason Coop, [facility]  04/08/2021

## 2021-04-09 ENCOUNTER — Encounter (HOSPITAL_COMMUNITY): Payer: Self-pay | Admitting: Otolaryngology

## 2021-04-10 ENCOUNTER — Other Ambulatory Visit: Payer: Self-pay | Admitting: Otolaryngology

## 2021-04-10 ENCOUNTER — Telehealth: Payer: Self-pay | Admitting: Hematology and Oncology

## 2021-04-10 DIAGNOSIS — C8589 Other specified types of non-Hodgkin lymphoma, extranodal and solid organ sites: Secondary | ICD-10-CM

## 2021-04-10 LAB — SURGICAL PATHOLOGY

## 2021-04-10 NOTE — Telephone Encounter (Signed)
Received a new pt referral from Dr. Fredric Dine for Diffuse non-Hodgkin's lymphoma of nasopharynx. [name] has been scheduled to see Dr. Chryl Heck on 5/25 at 10am. Appt date and time has been given to the pt's daughter. Aware to arrive 20 minutes early.

## 2021-04-15 ENCOUNTER — Encounter: Payer: Self-pay | Admitting: Hematology and Oncology

## 2021-04-15 ENCOUNTER — Telehealth: Payer: Self-pay

## 2021-04-15 ENCOUNTER — Inpatient Hospital Stay: Payer: Medicare HMO | Attending: Hematology and Oncology | Admitting: Hematology and Oncology

## 2021-04-15 ENCOUNTER — Inpatient Hospital Stay: Payer: Medicare HMO

## 2021-04-15 ENCOUNTER — Telehealth: Payer: Self-pay | Admitting: Hematology and Oncology

## 2021-04-15 ENCOUNTER — Other Ambulatory Visit: Payer: Self-pay

## 2021-04-15 VITALS — BP 130/71 | HR 82 | Temp 97.9°F | Resp 16 | Ht 59.0 in | Wt 100.6 lb

## 2021-04-15 DIAGNOSIS — I10 Essential (primary) hypertension: Secondary | ICD-10-CM | POA: Diagnosis not present

## 2021-04-15 DIAGNOSIS — R413 Other amnesia: Secondary | ICD-10-CM | POA: Insufficient documentation

## 2021-04-15 DIAGNOSIS — Z79899 Other long term (current) drug therapy: Secondary | ICD-10-CM | POA: Diagnosis not present

## 2021-04-15 DIAGNOSIS — R630 Anorexia: Secondary | ICD-10-CM | POA: Insufficient documentation

## 2021-04-15 DIAGNOSIS — N3001 Acute cystitis with hematuria: Secondary | ICD-10-CM | POA: Diagnosis not present

## 2021-04-15 DIAGNOSIS — R634 Abnormal weight loss: Secondary | ICD-10-CM | POA: Diagnosis not present

## 2021-04-15 DIAGNOSIS — C8330 Diffuse large B-cell lymphoma, unspecified site: Secondary | ICD-10-CM

## 2021-04-15 LAB — CMP (CANCER CENTER ONLY)
ALT: 22 U/L (ref 0–44)
AST: 33 U/L (ref 15–41)
Albumin: 3.2 g/dL — ABNORMAL LOW (ref 3.5–5.0)
Alkaline Phosphatase: 118 U/L (ref 38–126)
Anion gap: 13 (ref 5–15)
BUN: 12 mg/dL (ref 8–23)
CO2: 30 mmol/L (ref 22–32)
Calcium: 9.3 mg/dL (ref 8.9–10.3)
Chloride: 97 mmol/L — ABNORMAL LOW (ref 98–111)
Creatinine: 0.62 mg/dL (ref 0.44–1.00)
GFR, Estimated: >60 mL/min (ref >60)
Glucose, Bld: 88 mg/dL (ref 70–99)
Potassium: 4 mmol/L (ref 3.5–5.1)
Sodium: 140 mmol/L (ref 135–145)
Total Bilirubin: 0.4 mg/dL (ref 0.3–1.2)
Total Protein: 8 g/dL (ref 6.5–8.1)

## 2021-04-15 LAB — CBC WITH DIFFERENTIAL/PLATELET
Abs Immature Granulocytes: 0.02 10*3/uL (ref 0.00–0.07)
Basophils Absolute: 0 10*3/uL (ref 0.0–0.1)
Basophils Relative: 0 %
Eosinophils Absolute: 0 10*3/uL (ref 0.0–0.5)
Eosinophils Relative: 1 %
HCT: 29 % — ABNORMAL LOW (ref 36.0–46.0)
Hemoglobin: 9.4 g/dL — ABNORMAL LOW (ref 12.0–15.0)
Immature Granulocytes: 0 %
Lymphocytes Relative: 30 %
Lymphs Abs: 2.4 10*3/uL (ref 0.7–4.0)
MCH: 22.3 pg — ABNORMAL LOW (ref 26.0–34.0)
MCHC: 32.4 g/dL (ref 30.0–36.0)
MCV: 68.9 fL — ABNORMAL LOW (ref 80.0–100.0)
Monocytes Absolute: 0.7 10*3/uL (ref 0.1–1.0)
Monocytes Relative: 9 %
Neutro Abs: 4.8 10*3/uL (ref 1.7–7.7)
Neutrophils Relative %: 60 %
Platelets: 232 10*3/uL (ref 150–400)
RBC: 4.21 MIL/uL (ref 3.87–5.11)
RDW: 16.5 % — ABNORMAL HIGH (ref 11.5–15.5)
WBC: 8 10*3/uL (ref 4.0–10.5)
nRBC: 0 % (ref 0.0–0.2)

## 2021-04-15 LAB — PHOSPHORUS: Phosphorus: 2.7 mg/dL (ref 2.5–4.6)

## 2021-04-15 LAB — LACTATE DEHYDROGENASE: LDH: 571 U/L — ABNORMAL HIGH (ref 98–192)

## 2021-04-15 LAB — HEPATITIS PANEL, ACUTE
HCV Ab: NONREACTIVE
Hep A IgM: NONREACTIVE
Hep B C IgM: NONREACTIVE
Hepatitis B Surface Ag: NONREACTIVE

## 2021-04-15 LAB — URIC ACID: Uric Acid, Serum: 5.1 mg/dL (ref 2.5–7.1)

## 2021-04-15 LAB — MAGNESIUM: Magnesium: 1.7 mg/dL (ref 1.7–2.4)

## 2021-04-15 NOTE — Telephone Encounter (Signed)
Scheduled per los. Gave avs and calendar  

## 2021-04-15 NOTE — Telephone Encounter (Signed)
PET scan scheduled per Dr Rob Hickman request. Patient's family updated. Verbalized understanding.

## 2021-04-15 NOTE — Assessment & Plan Note (Signed)
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 She arrived to the appointment today with her granddaughter, her son Jenny Reichmann was on the phone.  We have discussed that nasopharyngeal lymphoma is a very rare malignancy and most often diffuse large B-cell lymphoma is common subtype. Patient is not a very good historian, she has some memory issues but does complain of some drainage from the nose for the past several months and since she recently fell, some bloody drainage from the nose and occasional spitting up of bloody mucus from her mouth which she refers to as likely postnasal drip.  She is also noticed some loss of appetite in the past several months and weight loss which granddaughter approximately coded as about 20 pounds. Physical examination, frail appearing elderly female was able to slowly get up on examination table with minimal help.  I have seen mass in the roof of the mouth beyond the tonsillar area but cannot discern much on a regular oral exam.  No visible mass in the nostrils.  No enlargement of tonsillar region or other palpable cervical lymphadenopathy. We have discussed about further staging with PET/CT, labs today and we have reviewed possible options for treatment.  If she does have early stage DLBCL, 1 could consider dose modified R-CHOP such as R mini CHOP given her frailty and age as definitive treatment.  If for any reason she cannot tolerate R mini CHOP, she can consider I-S RT although this would not be the most effective way of treatment.  I have briefly discussed that chemotherapy can cause adverse effects including but not limited to severe fatigue, nausea, vomiting, cardiomyopathy, neuropathy, increased risk of infections, hair loss etc.   Occasionally the adverse effects can be life-threatening.  I explained all of this to the son and granddaughter as well in detail.  They would like to proceed with further investigation hence have ordered imaging PET/CT, tumor lysis labs, hepatitis labs and echocardiogram. I encouraged family to discuss with their mom again in detail and convey to [place] her decision about treatment.  She did mention to me that everybody has to go one day and she understands if this diagnosis is life-threatening. She will return to clinic in 2 weeks with above-mentioned investigation. If we wish to proceed with treatment, we will have to arrange for chemo education and infusion appointments.  Thank you for consulting [place] in the care of this patient.  Please not hesitate to contact us with any additional questions or concerns.

## 2021-04-15 NOTE — Progress Notes (Signed)
[facility] NOTE  Patient Care Team: Antony Contras, MD as PCP - General (Family Medicine)  CHIEF COMPLAINTS/PURPOSE OF CONSULTATION:  DLBCL of nasopharynx.  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 She arrived to the appointment today with her granddaughter, her son Jenny Reichmann was on the phone.  We have discussed that nasopharyngeal lymphoma is a very rare malignancy and most often diffuse large B-cell lymphoma is common subtype. Patient is not a very good historian, she has some memory issues but does complain of some drainage from the nose for the past several months and since she recently fell, some bloody drainage from the nose and occasional spitting up of bloody mucus from her mouth which she refers to as likely postnasal drip.  She is also noticed some loss of appetite in the past several months and weight loss which granddaughter approximately coded as about 20 pounds. Physical examination, frail appearing elderly female was able to slowly get up on examination table with minimal help.  I have seen mass in the roof of the mouth beyond the tonsillar area but cannot discern much on a regular oral exam.  No visible mass in the nostrils.  No enlargement of tonsillar region or other palpable cervical lymphadenopathy. We have discussed about further staging with PET/CT, labs today and we have reviewed possible options for treatment.  If she does have early stage DLBCL, 1 could consider dose modified R-CHOP such as R mini CHOP given her frailty and age as definitive treatment.  If for any reason she cannot tolerate R mini CHOP, she can consider I-S RT  although this would not be the most effective way of treatment.  I have briefly discussed that chemotherapy can cause adverse effects including but not limited to severe fatigue, nausea, vomiting, cardiomyopathy, neuropathy, increased risk of infections, hair loss etc.  Occasionally the adverse effects can be life-threatening.  I explained all of this to the son and granddaughter as well in detail.  They would like to proceed with further investigation hence have ordered imaging PET/CT, tumor lysis labs, hepatitis labs and echocardiogram. I encouraged family to discuss with their mom again in detail and convey to [place] her decision about treatment.  She did mention to me that everybody has to go one day and she understands if this diagnosis is life-threatening. She will return to clinic in 2 weeks with above-mentioned investigation. If we wish to proceed with treatment, we will have to arrange for chemo education and infusion appointments.  Thank you for consulting [place] in the care of this patient.  Please not hesitate to contact us with any additional questions or concerns.  Orders Placed This Encounter  Procedures  . NM PET Image Initial (PI) Skull Base To Thigh    Standing Status:   Future    Standing Expiration Date:   04/15/2022    Order Specific Question:   If indicated for the ordered procedure, I authorize the administration of a radiopharmaceutical per Radiology protocol    Answer:   Yes    Order Specific Question:   Preferred imaging location?    Answer:   [facility]  . CBC with Differential/Platelet    Standing Status:  Standing    Number of Occurrences:   22    Standing Expiration Date:   04/15/2022  . CMP ([facility] only)    Standing Status:   Future    Number of Occurrences:   1    Standing Expiration Date:   04/15/2022  . Lactate dehydrogenase    Standing Status:   Future    Number of Occurrences:   1    Standing Expiration Date:   04/15/2022  . Uric acid    Standing Status:    Future    Number of Occurrences:   1    Standing Expiration Date:   04/15/2022  . Phosphorus    Standing Status:   Future    Number of Occurrences:   1    Standing Expiration Date:   04/15/2022  . Magnesium    Standing Status:   Future    Number of Occurrences:   1    Standing Expiration Date:   04/15/2022  . Hepatitis panel, acute    Standing Status:   Future    Number of Occurrences:   1    Standing Expiration Date:   04/15/2022  . ECHOCARDIOGRAM COMPLETE    Standing Status:   Future    Standing Expiration Date:   04/15/2022    Order Specific Question:   Where should this test be performed    Answer:   [facility]    Order Specific Question:   Perflutren DEFINITY (image enhancing agent) should be administered unless hypersensitivity or allergy exist    Answer:   Administer Perflutren    Order Specific Question:   Reason for exam-Echo    Answer:   Chemo  Z09    Order Specific Question:   Other Comments    Answer:   May need mini R CHOP, preparation for chemo     HISTORY OF PRESENTING ILLNESS:   [name] 87 y.o. female is here because of new diagnosis of DLBCL of nasopharynx.  [name] is a very pleasant 87 year old female patient who arrived today with her granddaughter [name] to the appointment.  She does not quite remember things as explained and has short-term memory loss.  After a bit of the conversation, she did admit to me that she was told about a cancer in her nose which was biopsied last week but she cannot remember the details.  Ms. [name] mentioned that for the past year or so grandma has been having more nasal drainage and has progressively lost weight in the past couple months of at least 20 pounds, has become relatively more sedentary and inactive, has no appetite.  They initially thought she might be having a stroke when she was not getting out of bed for 3days and has not been eating.  When she was in the ER.  She had some imaging CT head without contrast which  showed left maxillary and ethmoid sinus disease. She then had MRI brain on the same day which showed 3.2 x 2 x 3.5 cm heterogeneous mass positioned at the left nasopharynx near the fossa of Rosenmuller.  Finding is most concerning for a primary nasopharyngeal neoplasm.  ENT consultation was recommended.  She then went on to see [name].Marland Kitchen  She had nasal endoscopy with biopsy of left nasopharyngeal mass.  According to Dr. Roseanna Rainbow, polypoid mass lesion was noted to be lymph emanating from the posterior aspect of the left middle turbinate.  Pathology showed findings consistent with diffuse large  B-cell lymphoma of the left nasopharyngeal mass as well as left middle turbinate.  She is hence referred to medical oncology for further recommendations.  Again she is not a great historian but according to family she was relatively active until several months ago.  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  She still continues to be independent with all her chores but is relatively inactive.  She denies any fevers or drenching night sweats for me.  She does admit to loss of appetite and loss of weight.  She says that sometimes walking up a flight of stairs is harder but no overt shortness of breath.  No overt change in bowel habits or urinary habits although family was wondering about her most recent urinary incontinence and possible urinary tract infection.  She also apparently had a recent fall when she was walking around her bed and since then has been having some bloody drainage through her nose and occasional spitting up of bloody sputum from her mouth. Rest of the pertinent 10 point ROS reviewed and negative.    MEDICAL HISTORY:  Past Medical History:  Diagnosis Date  . Anemia   . Arthritis    knees   . GERD (gastroesophageal reflux disease)   . Hiatal hernia   . History of inguinal hernia repair    BIH  . Hyperlipidemia   . Hypertension   .  Memory problem     SURGICAL HISTORY: Past Surgical History:  Procedure Laterality Date  . COLONOSCOPY    . HERNIA REPAIR  10/13/11   lap BIH rep w/ mesh- Dr. Dalbert Batman   . INGUINAL HERNIA REPAIR  10/13/2011   Procedure: LAPAROSCOPIC INGUINAL HERNIA;  Surgeon: Adin Hector, MD;  Location: [facility];  Service: General;  Laterality: Bilateral;  Laparoscopic bilateral inguinal hernia repair, converted to open bilateral inguinal hernia repairs with mesh  . NASAL ENDOSCOPY Left 04/08/2021   Procedure: NASAL ENDOSCOPY WITH BIOPSY OF LEFT NASOPPHARYNGEAL MASS;  Surgeon: Jason Coop, DO;  Location: [facility];  Service: ENT;  Laterality: Left;  Frozen Section    SOCIAL HISTORY: Social History   Socioeconomic History  . Marital status: Single    Spouse name: Not on file  . Number of children: Not on file  . Years of education: Not on file  . Highest education level: Not on file  Occupational History  . Not on file  Tobacco Use  . Smoking status: Never Smoker  . Smokeless tobacco: Never Used  Vaping Use  . Vaping Use: Never used  Substance and Sexual Activity  . Alcohol use: No  . Drug use: No  . Sexual activity: Not on file  Other Topics Concern  . Not on file  Social History Narrative  . Not on file   Social Determinants of Health   Financial Resource Strain: Not on file  Food Insecurity: Not on file  Transportation Needs: Not on file  Physical Activity: Not on file  Stress: Not on file  Social Connections: Not on file  Intimate Partner Violence: Not on file    FAMILY HISTORY: Family History  Problem Relation Age of Onset  . Heart disease Mother     ALLERGIES:  has No Known Allergies.  MEDICATIONS:  Current Outpatient Medications  Medication Sig Dispense Refill  . acetaminophen (TYLENOL) 500 MG tablet Take 1,000 mg by mouth every 6 (six) hours as needed for mild pain or fever.    Marland Kitchen amLODipine (  NORVASC) 10 MG tablet Take 10 mg by mouth daily.    Marland Kitchen atorvastatin  (LIPITOR) 80 MG tablet Take 80 mg by mouth every morning.    . triamterene-hydrochlorothiazide (MAXZIDE-25) 37.5-25 MG tablet Take 1 tablet by mouth every morning.    . ezetimibe (ZETIA) 10 MG tablet Take 10 mg by mouth every morning.    . predniSONE (DELTASONE) 20 MG tablet Take 2 tablets (40 mg total) by mouth daily. Take 40 mg by mouth daily for 3 days, then 20mg  by mouth daily for 3 days, then 10mg  daily for 3 days (Patient not taking: Reported on 04/15/2021) 12 tablet 0   No current facility-administered medications for this visit.     PHYSICAL EXAMINATION:  ECOG PERFORMANCE STATUS: 1 - Symptomatic but completely ambulatory  Vitals:   04/15/21 0945  BP: 130/71  Pulse: 82  Resp: 16  Temp: 97.9 F (36.6 C)  SpO2: 99%   Filed Weights   04/15/21 0945  Weight: 100 lb 9.6 oz (45.6 kg)    GENERAL:alert, no distress and comfortable SKIN: skin color, texture, turgor are normal, no rashes or significant lesions EYES: normal, conjunctiva are pink and non-injected, sclera clear OROPHARYNX:no exudate, no erythema and lips, buccal mucosa, and tongue normal  NECK: supple, thyroid normal size, non-tender, without nodularity LYMPH:  no palpable lymphadenopathy in the cervical, axillary or inguinal LUNGS: clear to auscultation and percussion with normal breathing effort HEART: regular rate & rhythm and no murmurs and no lower extremity edema ABDOMEN:abdomen soft, non-tender and normal bowel sounds Musculoskeletal:no cyanosis of digits and no clubbing  PSYCH: alert & oriented x 3 with fluent speech NEURO: no focal motor/sensory deficits  LABORATORY DATA:  I have reviewed the data as listed Lab Results  Component Value Date   WBC 8.0 04/15/2021   HGB 9.4 (L) 04/15/2021   HCT 29.0 (L) 04/15/2021   MCV 68.9 (L) 04/15/2021   PLT 232 04/15/2021     Chemistry      Component Value Date/Time   NA 140 04/15/2021 1121   K 4.0 04/15/2021 1121   CL 97 (L) 04/15/2021 1121   CO2 30  04/15/2021 1121   BUN 12 04/15/2021 1121   CREATININE 0.62 04/15/2021 1121      Component Value Date/Time   CALCIUM 9.3 04/15/2021 1121   ALKPHOS 118 04/15/2021 1121   AST 33 04/15/2021 1121   ALT 22 04/15/2021 1121   BILITOT 0.4 04/15/2021 1121       RADIOGRAPHIC STUDIES: I have personally reviewed the radiological images as listed and agreed with the findings in the report. CT HEAD WO CONTRAST  Result Date: 03/22/2021 CLINICAL DATA:  Hypertension, dizziness EXAM: CT HEAD WITHOUT CONTRAST TECHNIQUE: Contiguous axial images were obtained from the base of the skull through the vertex without intravenous contrast. COMPARISON:  08/20/2014 FINDINGS: Brain: No acute infarct or hemorrhage. Lateral ventricles and midline structures are unremarkable. No acute extra-axial fluid collections. No mass effect. Vascular: No hyperdense vessel or unexpected calcification. Skull: Normal. Negative for fracture or focal lesion. Sinuses/Orbits: There is opacification of the left maxillary and ethmoid sinuses. Other: None. IMPRESSION: 1. No acute intracranial process. 2. Left maxillary and ethmoid sinus disease. Electronically Signed   By: Randa Ngo M.D.   On: 03/22/2021 20:22   MR BRAIN WO CONTRAST  Result Date: 03/22/2021 CLINICAL DATA:  Initial evaluation for neuro deficit, stroke suspected. EXAM: MRI HEAD WITHOUT CONTRAST TECHNIQUE: Multiplanar, multiecho pulse sequences of the brain and surrounding structures were obtained without  intravenous contrast. COMPARISON:  Prior head CT from earlier the same day. FINDINGS: Brain: Cerebral volume within normal limits for age. Scattered patchy T2/FLAIR hyperintensity within the periventricular and deep white matter both cerebral hemispheres, most likely related chronic microvascular ischemic disease, mild in nature. Mild patchy involvement of the pons noted. Small focus of encephalomalacia at the anterior left temporal lobe noted, likely posttraumatic. No abnormal  foci of restricted diffusion to suggest acute or subacute ischemia. Gray-white matter differentiation maintained. No encephalomalacia to suggest chronic cortical infarction. No evidence for acute or chronic intracranial hemorrhage. No mass lesion within the brain itself. No mass effect or midline shift. No hydrocephalus or extra-axial fluid collection. Pituitary gland suprasellar region within normal limits. Midline structures intact. Vascular: Major intracranial vascular flow voids are maintained. Skull and upper cervical spine: Craniocervical junction within normal limits. Multilevel degenerative spondylosis noted within the visualized upper cervical spine without high-grade spinal stenosis. Sinuses/Orbits: Globes and orbital soft tissues within normal limits. Chronic left maxillary and ethmoidal sinusitis. Other: There is a heterogeneous lobulated mass positioned at the left nasopharynx measuring approximately 3.2 x 2.0 x 3.5 cm (transverse by AP by craniocaudad). Lesion is centered near the fossa of Rosenmuller. No visible significant extension into the adjacent left parapharyngeal space on this exam. No visible erosive changes of the clivus or skull base. No associated mastoid or middle ear effusion. IMPRESSION: 1. No acute intracranial abnormality. 2. 3.2 x 2.0 x 3.5 cm heterogeneous mass positioned at the left nasopharynx near the fossa of Rosenmuller. 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 ENT consultation and referral recommended. 3. Chronic left maxillary and ethmoidal sinusitis. 4. Mild chronic microvascular ischemic disease. Electronically Signed   By: Jeannine Boga M.D.   On: 03/22/2021 22:33    All questions were answered. The patient knows to call the clinic with any problems, questions or concerns. I spent 60 minutes in the care of this patient including H and P, review of records, counseling and coordination of care. We have reviewed  imaging, pathology reports, discussed about diffuse large B-cell lymphoma of the nasopharynx and possible treatment options including chemotherapy and I-S RT.  We have also discussed about considering hospice if she is not willing to proceed with any treatment.  Have also called her son Mr.John and explained to him about the situation during the visit.    Benay Pike, MD 04/15/2021 12:15 PM

## 2021-04-16 DIAGNOSIS — R350 Frequency of micturition: Secondary | ICD-10-CM | POA: Diagnosis not present

## 2021-04-28 ENCOUNTER — Other Ambulatory Visit: Payer: Self-pay

## 2021-04-28 ENCOUNTER — Ambulatory Visit (HOSPITAL_COMMUNITY)
Admission: RE | Admit: 2021-04-28 | Discharge: 2021-04-28 | Disposition: A | Discharge status: Home / Self Care | Payer: Medicare HMO | Source: Ambulatory Visit | Attending: Hematology and Oncology | Admitting: Hematology and Oncology

## 2021-04-28 DIAGNOSIS — C8330 Diffuse large B-cell lymphoma, unspecified site: Secondary | ICD-10-CM | POA: Insufficient documentation

## 2021-04-28 DIAGNOSIS — C7951 Secondary malignant neoplasm of bone: Secondary | ICD-10-CM | POA: Insufficient documentation

## 2021-04-28 LAB — GLUCOSE, CAPILLARY: Glucose-Capillary: 88 mg/dL (ref 70–99)

## 2021-04-28 MED ORDER — FLUDEOXYGLUCOSE F - 18 (FDG) INJECTION
4.9600 | Freq: Once | INTRAVENOUS | Status: AC | PRN
  Administered 2021-04-28: 4.96 via INTRAVENOUS

## 2021-04-29 ENCOUNTER — Encounter: Payer: Self-pay | Admitting: *Deleted

## 2021-04-29 ENCOUNTER — Inpatient Hospital Stay: Payer: Medicare HMO

## 2021-04-29 ENCOUNTER — Encounter (HOSPITAL_COMMUNITY): Payer: Self-pay | Admitting: Physician Assistant

## 2021-04-29 ENCOUNTER — Encounter: Payer: Self-pay | Admitting: Hematology and Oncology

## 2021-04-29 ENCOUNTER — Inpatient Hospital Stay: Payer: Medicare HMO | Attending: Hematology and Oncology | Admitting: Hematology and Oncology

## 2021-04-29 VITALS — BP 108/67 | HR 63 | Temp 98.0°F | Resp 16 | Ht 59.0 in | Wt 97.0 lb

## 2021-04-29 DIAGNOSIS — I10 Essential (primary) hypertension: Secondary | ICD-10-CM | POA: Diagnosis not present

## 2021-04-29 DIAGNOSIS — C8331 Diffuse large B-cell lymphoma, lymph nodes of head, face, and neck: Secondary | ICD-10-CM | POA: Diagnosis not present

## 2021-04-29 DIAGNOSIS — C8330 Diffuse large B-cell lymphoma, unspecified site: Secondary | ICD-10-CM

## 2021-04-29 DIAGNOSIS — Z79899 Other long term (current) drug therapy: Secondary | ICD-10-CM | POA: Diagnosis not present

## 2021-04-29 LAB — CMP (CANCER CENTER ONLY)
ALT: 21 U/L (ref 0–44)
AST: 30 U/L (ref 15–41)
Albumin: 3.2 g/dL — ABNORMAL LOW (ref 3.5–5.0)
Alkaline Phosphatase: 121 U/L (ref 38–126)
Anion gap: 16 — ABNORMAL HIGH (ref 5–15)
BUN: 12 mg/dL (ref 8–23)
CO2: 28 mmol/L (ref 22–32)
Calcium: 9.7 mg/dL (ref 8.9–10.3)
Chloride: 100 mmol/L (ref 98–111)
Creatinine: 0.69 mg/dL (ref 0.44–1.00)
GFR, Estimated: >60 mL/min (ref >60)
Glucose, Bld: 110 mg/dL — ABNORMAL HIGH (ref 70–99)
Potassium: 3.2 mmol/L — ABNORMAL LOW (ref 3.5–5.1)
Sodium: 144 mmol/L (ref 135–145)
Total Bilirubin: 0.5 mg/dL (ref 0.3–1.2)
Total Protein: 7.9 g/dL (ref 6.5–8.1)

## 2021-04-29 LAB — CBC WITH DIFFERENTIAL/PLATELET
Abs Immature Granulocytes: 0.02 10*3/uL (ref 0.00–0.07)
Basophils Absolute: 0 10*3/uL (ref 0.0–0.1)
Basophils Relative: 0 %
Eosinophils Absolute: 0 10*3/uL (ref 0.0–0.5)
Eosinophils Relative: 0 %
HCT: 29.9 % — ABNORMAL LOW (ref 36.0–46.0)
Hemoglobin: 9.5 g/dL — ABNORMAL LOW (ref 12.0–15.0)
Immature Granulocytes: 0 %
Lymphocytes Relative: 22 %
Lymphs Abs: 2.1 10*3/uL (ref 0.7–4.0)
MCH: 22.4 pg — ABNORMAL LOW (ref 26.0–34.0)
MCHC: 31.8 g/dL (ref 30.0–36.0)
MCV: 70.4 fL — ABNORMAL LOW (ref 80.0–100.0)
Monocytes Absolute: 0.7 10*3/uL (ref 0.1–1.0)
Monocytes Relative: 7 %
Neutro Abs: 6.9 10*3/uL (ref 1.7–7.7)
Neutrophils Relative %: 71 %
Platelets: 370 10*3/uL (ref 150–400)
RBC: 4.25 MIL/uL (ref 3.87–5.11)
RDW: 17.2 % — ABNORMAL HIGH (ref 11.5–15.5)
WBC: 9.8 10*3/uL (ref 4.0–10.5)
nRBC: 0 % (ref 0.0–0.2)

## 2021-04-29 LAB — LACTATE DEHYDROGENASE: LDH: 548 U/L — ABNORMAL HIGH (ref 98–192)

## 2021-04-29 LAB — PHOSPHORUS: Phosphorus: 3.1 mg/dL (ref 2.5–4.6)

## 2021-04-29 LAB — URIC ACID: Uric Acid, Serum: 6.6 mg/dL (ref 2.5–7.1)

## 2021-04-29 LAB — MAGNESIUM: Magnesium: 1.7 mg/dL (ref 1.7–2.4)

## 2021-04-29 MED ORDER — ALLOPURINOL 300 MG PO TABS: 300.0000 mg | ORAL_TABLET | Freq: Every day | ORAL | 0 refills | Status: DC

## 2021-04-29 NOTE — Progress Notes (Signed)
[name] NOTE  Patient Care Team: Antony Contras, MD as PCP - General (Family Medicine)  CHIEF COMPLAINTS/PURPOSE OF CONSULTATION:  DLBCL of nasopharynx.  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  During her initial visit, we have discussed about pathology findings which showed diffuse large B-cell lymphoma of the left nasopharyngeal mass and discussed about needed staging and other work-up.  She did not have any evidence of TLS on initial labs.  PET/CT however demonstrated metastatic disease with multiple bony lesions, liver lesions and primary nasopharyngeal mass however no evidence of lymphadenopathy or splenic involvement. We have discussed about considering a second biopsy since radiology was concerned about the thought pattern of metastasis.  Patient agrees to second biopsy and this has been ordered.  We may be able to just do a bone marrow aspiration and biopsy which will confirm the diagnosis as well as finish staging.  I have also asked her to get the port scheduled in anticipation of chemotherapy, patient would like to try some chemotherapy she understands that this can have fatal adverse effects but this may be her only way at control of the disease.  We have discussed about chest port which is needed typically for the chemotherapy used in diffuse large B-cell lymphoma.  This has been ordered.  We recommended repeating her tumor lysis labs today, discussed about starting on allopurinol prophylaxis daily.  Sent another message to our social worker team as well as referral because patient and her family want to proceed with that designating a medical power of attorney who will be consenting for most of her treatments and  procedures.  She will return to clinic in a couple weeks after the above-mentioned work-up to initiate treatment. Thank you for consulting [place] in the care of this patient.  Please do not hesitate to contact us with any additional questions or concerns.  Orders Placed This Encounter  Procedures  . CT BIOPSY    Order Specific Question:   Reason for Exam (SYMPTOM  OR DIAGNOSIS REQUIRED)    Answer:   Radiology recommend second biopsy since its an odd pattern for lymphoma spread.    Order Specific Question:   Preferred imaging location?    Answer:   [facility]  . IR IMAGING GUIDED PORT INSERTION    Standing Status:   Future    Standing Expiration Date:   04/29/2022    Order Specific Question:   Reason for Exam (SYMPTOM  OR DIAGNOSIS REQUIRED)    Answer:   chemotherapy adminsitration    Order Specific Question:   Preferred Imaging Location?    Answer:   [facility]  . CT BONE MARROW BIOPSY & ASPIRATION    Standing Status:   Future    Standing Expiration Date:   04/29/2022    Order Specific Question:   Reason for Exam (SYMPTOM  OR DIAGNOSIS REQUIRED)    Answer:   DLBCL staging    Order Specific Question:   Preferred imaging location?    Answer:   [facility]    Order Specific Question:   Radiology Contrast Protocol - do NOT remove file path    Answer:   [email or web address]  . CBC with Differential/Platelet    Standing Status:  Standing    Number of Occurrences:   22    Standing Expiration Date:   04/29/2022  . CMP ([place] only)    Standing Status:   Future    Number of Occurrences:   1    Standing Expiration Date:   04/29/2022  . Lactate dehydrogenase    Standing Status:   Future    Number of Occurrences:   1    Standing Expiration Date:   04/29/2022  . Uric acid    Standing Status:   Future    Number of Occurrences:   1    Standing Expiration Date:   04/29/2022  . Phosphorus    Standing Status:   Future    Number of Occurrences:    1    Standing Expiration Date:   04/29/2022  . Magnesium    Standing Status:   Future    Number of Occurrences:   1    Standing Expiration Date:   04/29/2022  . Ambulatory referral to Social Work    Referral Priority:   Routine    Referral Type:   Consultation    Referral Reason:   Specialty Services Required    Number of Visits Requested:   1     HISTORY OF PRESENTING ILLNESS:   [name] 87 y.o. female is here because of new diagnosis of DLBCL of nasopharynx.  [name] is a very pleasant 87 year old female patient who arrived today with her granddaughter [name]. [name] to the appointment.  She does not quite remember things as explained and has short-term memory loss.  After a bit of the conversation, she did admit to me that she was told about a cancer in her nose which was biopsied last week but she cannot remember the details.  [name] mentioned that for the past year or so grandma has been having more nasal drainage and has progressively lost weight in the past couple months of at least 20 pounds, has become relatively more sedentary and inactive, has no appetite.  They initially thought she might be having a stroke when she was not getting out of bed for 3days and has not been eating.  When she was in the ER.  She had some imaging CT head without contrast which showed left maxillary and ethmoid sinus disease. She then had MRI brain on the same day which showed 3.2 x 2 x 3.5 cm heterogeneous mass positioned at the left nasopharynx near the fossa of Rosenmuller.  Finding is most concerning for a primary nasopharyngeal neoplasm.  ENT consultation was recommended.  She then went on to see [name].Marland Kitchen  She had nasal endoscopy with biopsy of left nasopharyngeal mass.  According to Dr. Roseanna Rainbow, polypoid mass lesion was noted to be lymphoma from the posterior aspect of the left middle turbinate.  Pathology showed findings consistent with diffuse large B-cell lymphoma of the left  nasopharyngeal mass as well as left middle turbinate.    Interval History  [name] is here for a follow-up with her granddaughter.  Her son was on the phone.  Again [name]. Kloth cannot provide much history mostly because of her forgetfulness, she sees things going in and out of her brain quickly so she cannot retain information.  She denies any new complaints which are relevant.  She was wondering if she can get some glasses because she cannot see very well.  She is fatigued but does not have any other new complaints.  She had  her PET/CT yesterday and they are here to review the results.  Rest of the pertinent review of systems reviewed and are negative.  MEDICAL HISTORY:  Past Medical History:  Diagnosis Date  . Anemia   . Arthritis    knees   . GERD (gastroesophageal reflux disease)   . Hiatal hernia   . History of inguinal hernia repair    BIH  . Hyperlipidemia   . Hypertension   . Memory problem     SURGICAL HISTORY: Past Surgical History:  Procedure Laterality Date  . COLONOSCOPY    . HERNIA REPAIR  10/13/11   lap BIH rep w/ mesh- Dr. Dalbert Batman   . INGUINAL HERNIA REPAIR  10/13/2011   Procedure: LAPAROSCOPIC INGUINAL HERNIA;  Surgeon: Adin Hector, MD;  Location: [facility];  Service: General;  Laterality: Bilateral;  Laparoscopic bilateral inguinal hernia repair, converted to open bilateral inguinal hernia repairs with mesh  . NASAL ENDOSCOPY Left 04/08/2021   Procedure: NASAL ENDOSCOPY WITH BIOPSY OF LEFT NASOPPHARYNGEAL MASS;  Surgeon: Jason Coop, DO;  Location: [facility];  Service: ENT;  Laterality: Left;  Frozen Section    SOCIAL HISTORY: Social History   Socioeconomic History  . Marital status: Single    Spouse name: Not on file  . Number of children: Not on file  . Years of education: Not on file  . Highest education level: Not on file  Occupational History  . Not on file  Tobacco Use  . Smoking status: Never Smoker  . Smokeless tobacco: Never Used  Vaping Use   . Vaping Use: Never used  Substance and Sexual Activity  . Alcohol use: No  . Drug use: No  . Sexual activity: Not on file  Other Topics Concern  . Not on file  Social History Narrative  . Not on file   Social Determinants of Health   Financial Resource Strain: Not on file  Food Insecurity: Not on file  Transportation Needs: Not on file  Physical Activity: Not on file  Stress: Not on file  Social Connections: Not on file  Intimate Partner Violence: Not on file    FAMILY HISTORY: Family History  Problem Relation Age of Onset  . Heart disease Mother     ALLERGIES:  has No Known Allergies.  MEDICATIONS:  Current Outpatient Medications  Medication Sig Dispense Refill  . acetaminophen (TYLENOL) 500 MG tablet Take 1,000 mg by mouth every 6 (six) hours as needed for mild pain or fever.    Marland Kitchen allopurinol (ZYLOPRIM) 300 MG tablet Take 1 tablet (300 mg total) by mouth daily. 30 tablet 0  . amLODipine (NORVASC) 10 MG tablet Take 10 mg by mouth daily.    Marland Kitchen atorvastatin (LIPITOR) 80 MG tablet Take 80 mg by mouth every morning.    . ezetimibe (ZETIA) 10 MG tablet Take 10 mg by mouth every morning.    . triamterene-hydrochlorothiazide (MAXZIDE-25) 37.5-25 MG tablet Take 1 tablet by mouth every morning.    . predniSONE (DELTASONE) 20 MG tablet Take 2 tablets (40 mg total) by mouth daily. Take 40 mg by mouth daily for 3 days, then 47m by mouth daily for 3 days, then 166mdaily for 3 days (Patient not taking: Reported on 04/29/2021) 12 tablet 0   No current facility-administered medications for this visit.     PHYSICAL EXAMINATION:  ECOG PERFORMANCE STATUS: 1 - Symptomatic but completely ambulatory  Vitals:   04/29/21 0924  BP: 108/67  Pulse: 63  Resp: 16  Temp: 98 F (36.7 C)  SpO2: 95%   Filed Weights   04/29/21 0924  Weight: 97 lb (44 kg)    Physical exam deferred today in lieu of counseling.  LABORATORY DATA:  I have reviewed the data as listed Lab Results   Component Value Date   WBC 9.8 04/29/2021   HGB 9.5 (L) 04/29/2021   HCT 29.9 (L) 04/29/2021   MCV 70.4 (L) 04/29/2021   PLT 370 04/29/2021     Chemistry      Component Value Date/Time   NA 144 04/29/2021 0954   K 3.2 (L) 04/29/2021 0954   CL 100 04/29/2021 0954   CO2 28 04/29/2021 0954   BUN 12 04/29/2021 0954   CREATININE 0.69 04/29/2021 0954      Component Value Date/Time   CALCIUM 9.7 04/29/2021 0954   ALKPHOS 121 04/29/2021 0954   AST 30 04/29/2021 0954   ALT 21 04/29/2021 0954   BILITOT 0.5 04/29/2021 0954       RADIOGRAPHIC STUDIES: I have personally reviewed the radiological images as listed and agreed with the findings in the report. NM PET Image Initial (PI) Skull Base To Thigh  Result Date: 04/28/2021 CLINICAL DATA:  Initial treatment strategy for diffuse large B-cell lymphoma on recent biopsy of the nasopharynx. EXAM: NUCLEAR MEDICINE PET SKULL BASE TO THIGH TECHNIQUE: 4.96 mCi F-18 FDG was injected intravenously. Full-ring PET imaging was performed from the skull base to thigh after the radiotracer. CT data was obtained and used for attenuation correction and anatomic localization. Fasting blood glucose: 88 mg/dl COMPARISON:  Brain MRI 03/22/2021 FINDINGS: Mediastinal blood pool activity: SUV max 1.82 Liver activity: SUV max 2.17 NECK: The large left-sided nasopharyngeal mass is hypermetabolic with SUV max of 08.65. I do not however see any enlarged or hypermetabolic neck lymph nodes. Incidental CT findings: Multinodular thyroid goiter noted but no hypermetabolic foci. Bilateral carotid artery calcifications. Opacification of the left maxillary sinus. CHEST: Few small axillary lymph nodes bilateral are weakly hypermetabolic SUV max of 7.84. These are likely reactive. No breast masses are identified. No enlarged or hypermetabolic mediastinal or hilar lymph nodes. No worrisome pulmonary lesions or pulmonary nodules to suggest metastatic disease. Incidental CT findings:  Advanced atherosclerotic calcifications involving the aorta and coronary arteries. ABDOMEN/PELVIS: There are 2 hypermetabolic right hepatic lobe lesions the more superior lesion measures 3.6 cm and has SUV max of 6.30. The slightly more inferior and posterior lesion measures 3.4 cm and the SUV max is 8.18 No adrenal gland lesions. No hypermetabolic abdominal or pelvic lymphadenopathy. The spleen is normal in size. No hypermetabolism. Incidental CT findings: Advanced atherosclerotic calcifications involving the aorta iliac arteries. No focal aneurysm. Calcified uterine fibroids are noted. SKELETON: Diffuse osseous metastatic disease. This involves the axial and appendicular skeleton. There are bilateral humeral lesions, right clavicle lesions, right scapular lesion, left sided sternal manubrial lesion, rib lesions, bony pelvic lesions and bilateral hip lesions. These are lytic destructive bone lesions. Index lesions: Left sternal lesion measures 17 mm and SUV max is 7.65. Upper right sacral lesion measures 2.7 cm and SUV max is 7.69 Right hip lesion at the level of the lesser trochanter has an SUV max of 10.60. Left subtrochanteric femoral lesion has an SUV max of 8.03 Incidental CT findings: none IMPRESSION: 1. Hypermetabolic left nasopharyngeal mass consistent with known biopsy proven lymphoma. No associated neck adenopathy. 2. Two large hypermetabolic liver lesions have the appearance of metastatic disease. 3. Diffuse destructive lytic metastatic bone lesions involving the axial  and appendicular skeleton as detailed above. 4. Normal appearance of the spleen and no adenopathy involving the chest, abdomen or pelvis. 5. Somewhat unusual presentation for lymphoma. Possible second primary? No obvious findings for breast cancer or lung cancer or colon cancer. Possible consideration for liver or bone biopsy. Electronically Signed   By: Marijo Sanes M.D.   On: 04/28/2021 12:20    All questions were answered. The  patient knows to call the clinic with any problems, questions or concerns. I have spent over 30 minutes in the care of this patient today.  We have discussed the PET/CT results and reviewed images, discussed about need for second biopsy given radiology concern for pattern of metastasis, need for bone marrow biopsy, echocardiogram which has already been scheduled, repeat labs and allopurinol prophylaxis.  We have again discussed about chemotherapy, likely mini R-CHOP if the second biopsy confirms diffuse large B-cell lymphoma, port placement needed for chemotherapy in anticipation.    Benay Pike, MD 04/29/2021 12:23 PM

## 2021-04-29 NOTE — Progress Notes (Signed)
[facility] Work  Clinical Social Work spoke with patients daughter.  CSW provided information on Advance Directives and schedule patient for the Advance Directives clinic on 05/04/21.    Johnnye Lana, MSW, LCSW, OSW-C Clinical Social Worker [facility] [phone]

## 2021-04-29 NOTE — Progress Notes (Signed)
[place] Work  Clinical Social Work received referral from medical oncology for advance directives.  CSW contacted patient by phone and was unable to leave a message dye to voicemail being full.  CSW will attempt to contact patient again at a later date.   Johnnye Lana, MSW, LCSW, OSW-C Clinical Social Worker [facility] [phone]

## 2021-04-29 NOTE — Assessment & Plan Note (Addendum)
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  During her initial visit, we have discussed about pathology findings which showed diffuse large B-cell lymphoma of the left nasopharyngeal mass and discussed about needed staging and other work-up.  She did not have any evidence of TLS on initial labs.  PET/CT however demonstrated metastatic disease with multiple bony lesions, liver lesions and primary nasopharyngeal mass however no evidence of lymphadenopathy or splenic involvement. We have discussed about considering a second biopsy since radiology was concerned about the thought pattern of metastasis.  Patient agrees to second biopsy and this has been ordered.  We may be able to just do a bone marrow aspiration and biopsy which will confirm the diagnosis as well as finish staging.  I have also asked her to get the port scheduled in anticipation of chemotherapy, patient would like to try some chemotherapy she understands that this can have fatal adverse effects but this may be her only way at control of the disease.  We have discussed about chest port which is needed typically for the chemotherapy used in diffuse large B-cell lymphoma.  This has been ordered.  We recommended repeating her tumor lysis labs today, discussed about starting on allopurinol prophylaxis daily.  Sent another message to our social worker team as well as referral because patient and her family want to proceed with that designating a medical power of attorney who will be consenting for most of her treatments and procedures.  She will return to clinic in a couple weeks after the above-mentioned work-up to initiate treatment. Thank you for consulting [place] in the care of this patient.  Please do not hesitate to contact us with any additional questions or concerns.

## 2021-04-30 ENCOUNTER — Telehealth (HOSPITAL_COMMUNITY): Payer: Self-pay

## 2021-05-01 ENCOUNTER — Other Ambulatory Visit: Payer: Self-pay

## 2021-05-01 ENCOUNTER — Ambulatory Visit (HOSPITAL_COMMUNITY)
Admission: RE | Admit: 2021-05-01 | Discharge: 2021-05-01 | Disposition: A | Discharge status: Home / Self Care | Payer: Medicare HMO | Source: Ambulatory Visit | Attending: Hematology and Oncology | Admitting: Hematology and Oncology

## 2021-05-01 DIAGNOSIS — D649 Anemia, unspecified: Secondary | ICD-10-CM | POA: Diagnosis not present

## 2021-05-01 DIAGNOSIS — C8330 Diffuse large B-cell lymphoma, unspecified site: Secondary | ICD-10-CM | POA: Insufficient documentation

## 2021-05-01 DIAGNOSIS — Z01818 Encounter for other preprocedural examination: Secondary | ICD-10-CM | POA: Diagnosis not present

## 2021-05-01 DIAGNOSIS — K219 Gastro-esophageal reflux disease without esophagitis: Secondary | ICD-10-CM | POA: Diagnosis not present

## 2021-05-01 DIAGNOSIS — Z0189 Encounter for other specified special examinations: Secondary | ICD-10-CM | POA: Diagnosis not present

## 2021-05-01 DIAGNOSIS — E785 Hyperlipidemia, unspecified: Secondary | ICD-10-CM | POA: Insufficient documentation

## 2021-05-01 DIAGNOSIS — I358 Other nonrheumatic aortic valve disorders: Secondary | ICD-10-CM | POA: Diagnosis not present

## 2021-05-01 DIAGNOSIS — I10 Essential (primary) hypertension: Secondary | ICD-10-CM | POA: Diagnosis not present

## 2021-05-01 LAB — ECHOCARDIOGRAM COMPLETE
Area-P 1/2: 3.27 cm2
S' Lateral: 1.9 cm

## 2021-05-01 NOTE — Progress Notes (Signed)
  Echocardiogram 2D Echocardiogram has been performed.  [name] [name] 05/01/2021, 10:05 AM

## 2021-05-04 ENCOUNTER — Other Ambulatory Visit: Payer: Self-pay | Admitting: Student

## 2021-05-04 ENCOUNTER — Other Ambulatory Visit: Payer: Self-pay

## 2021-05-04 ENCOUNTER — Inpatient Hospital Stay: Payer: Medicare HMO | Admitting: Licensed Clinical Social Worker

## 2021-05-04 DIAGNOSIS — C8330 Diffuse large B-cell lymphoma, unspecified site: Secondary | ICD-10-CM

## 2021-05-04 NOTE — Progress Notes (Signed)
[facility] Social Work  Clinical Social Work was referred to review and complete healthcare advance directives.  Clinical Social Worker/ chaplain met with patient and family members in support services.  The patient designated [name] as their primary healthcare agent and [name] as their secondary agent.  Patient also completed healthcare living will.    Clinical Social Worker notarized documents and made copies for patient/family. Clinical Social Worker will send documents to medical records to be scanned into patient's chart. Clinical Social Worker encouraged patient/family to contact with any additional questions or concerns.    [place] [phone]

## 2021-05-05 ENCOUNTER — Other Ambulatory Visit: Payer: Self-pay | Admitting: Student

## 2021-05-05 DIAGNOSIS — R413 Other amnesia: Secondary | ICD-10-CM | POA: Diagnosis not present

## 2021-05-05 DIAGNOSIS — Z1389 Encounter for screening for other disorder: Secondary | ICD-10-CM | POA: Diagnosis not present

## 2021-05-05 DIAGNOSIS — E782 Mixed hyperlipidemia: Secondary | ICD-10-CM | POA: Diagnosis not present

## 2021-05-05 DIAGNOSIS — I10 Essential (primary) hypertension: Secondary | ICD-10-CM | POA: Diagnosis not present

## 2021-05-05 DIAGNOSIS — E559 Vitamin D deficiency, unspecified: Secondary | ICD-10-CM | POA: Diagnosis not present

## 2021-05-05 DIAGNOSIS — C8330 Diffuse large B-cell lymphoma, unspecified site: Secondary | ICD-10-CM | POA: Diagnosis not present

## 2021-05-05 DIAGNOSIS — R32 Unspecified urinary incontinence: Secondary | ICD-10-CM | POA: Diagnosis not present

## 2021-05-05 DIAGNOSIS — Z0000 Encounter for general adult medical examination without abnormal findings: Secondary | ICD-10-CM | POA: Diagnosis not present

## 2021-05-05 DIAGNOSIS — D649 Anemia, unspecified: Secondary | ICD-10-CM | POA: Diagnosis not present

## 2021-05-06 ENCOUNTER — Encounter (HOSPITAL_COMMUNITY): Payer: Self-pay

## 2021-05-06 ENCOUNTER — Other Ambulatory Visit: Payer: Self-pay

## 2021-05-06 ENCOUNTER — Ambulatory Visit (HOSPITAL_COMMUNITY)
Admission: RE | Admit: 2021-05-06 | Discharge: 2021-05-06 | Disposition: A | Discharge status: Home / Self Care | Payer: Medicare HMO | Source: Ambulatory Visit | Attending: Hematology and Oncology | Admitting: Hematology and Oncology

## 2021-05-06 DIAGNOSIS — Z79899 Other long term (current) drug therapy: Secondary | ICD-10-CM | POA: Insufficient documentation

## 2021-05-06 DIAGNOSIS — C8330 Diffuse large B-cell lymphoma, unspecified site: Secondary | ICD-10-CM | POA: Insufficient documentation

## 2021-05-06 HISTORY — PX: IR IMAGING GUIDED PORT INSERTION: IMG5740

## 2021-05-06 MED ORDER — HEPARIN SOD (PORK) LOCK FLUSH 100 UNIT/ML IV SOLN
INTRAVENOUS | Status: AC
  Filled 2021-05-06: qty 5

## 2021-05-06 MED ORDER — LIDOCAINE-EPINEPHRINE 1 %-1:100000 IJ SOLN
INTRAMUSCULAR | Status: AC
  Filled 2021-05-06: qty 1

## 2021-05-06 MED ORDER — MIDAZOLAM HCL 2 MG/2ML IJ SOLN
INTRAMUSCULAR | Status: AC
  Filled 2021-05-06: qty 2

## 2021-05-06 MED ORDER — FENTANYL CITRATE (PF) 100 MCG/2ML IJ SOLN
INTRAMUSCULAR | Status: AC
  Filled 2021-05-06: qty 2

## 2021-05-06 MED ORDER — MIDAZOLAM HCL 2 MG/2ML IJ SOLN
INTRAMUSCULAR | Status: AC | PRN
  Administered 2021-05-06 (×2): 0.5 mg via INTRAVENOUS

## 2021-05-06 MED ORDER — HEPARIN SOD (PORK) LOCK FLUSH 100 UNIT/ML IV SOLN
INTRAVENOUS | Status: AC | PRN
  Administered 2021-05-06: 500 [IU] via INTRAVENOUS

## 2021-05-06 MED ORDER — SODIUM CHLORIDE 0.9 % IV SOLN: INTRAVENOUS | Status: DC

## 2021-05-06 MED ORDER — LIDOCAINE-EPINEPHRINE 1 %-1:100000 IJ SOLN
INTRAMUSCULAR | Status: AC | PRN
  Administered 2021-05-06 (×2): 10 mL via INTRADERMAL

## 2021-05-06 MED ORDER — FENTANYL CITRATE (PF) 100 MCG/2ML IJ SOLN
INTRAMUSCULAR | Status: AC | PRN
  Administered 2021-05-06 (×2): 25 ug via INTRAVENOUS

## 2021-05-06 NOTE — Procedures (Signed)
Interventional Radiology Procedure Note  Procedure: Placement of a right IJ approach single lumen PowerPort.  Tip is positioned at the superior cavoatrial junction and catheter is ready for immediate use.   Complications: No immediate  EBL: None  Recommendations:  - Ok to shower tomorrow - Do not submerge for 7 days - Routine line care    Signed,  Ari Engelbrecht K. Aldin Drees, MD   

## 2021-05-06 NOTE — H&P (Signed)
Chief Complaint: Patient was seen in consultation today for PORT PLACEMENT at the request of Iruku,Praveena  Referring Physician(s): Iruku,Praveena  Supervising Physician: Jacqulynn Cadet  Patient Status: [facility] - Out-pt  History of Present Illness: [name] is a 87 y.o. female with newly diagnosed B cell Lymphoma. She is referred for port placement PMHx, meds, labs, imaging, allergies reviewed. Feels well, no recent fevers, chills, illness. Has been NPO today as directed.    Past Medical History:  Diagnosis Date   Anemia    Arthritis    knees    GERD (gastroesophageal reflux disease)    Hiatal hernia    History of inguinal hernia repair    BIH   Hyperlipidemia    Hypertension    Memory problem     Past Surgical History:  Procedure Laterality Date   COLONOSCOPY     HERNIA REPAIR  10/13/11   lap BIH rep w/ mesh- Dr. Tora Kindred HERNIA REPAIR  10/13/2011   Procedure: LAPAROSCOPIC INGUINAL HERNIA;  Surgeon: Adin Hector, MD;  Location: [facility];  Service: General;  Laterality: Bilateral;  Laparoscopic bilateral inguinal hernia repair, converted to open bilateral inguinal hernia repairs with mesh   NASAL ENDOSCOPY Left 04/08/2021   Procedure: NASAL ENDOSCOPY WITH BIOPSY OF LEFT NASOPPHARYNGEAL MASS;  Surgeon: Jason Coop, DO;  Location: [facility];  Service: ENT;  Laterality: Left;  Frozen Section    Allergies: Patient has no known allergies.  Medications: Prior to Admission medications   Medication Sig Start Date End Date Taking? Authorizing Provider  acetaminophen (TYLENOL) 500 MG tablet Take 1,000 mg by mouth every 6 (six) hours as needed for mild pain or fever.    [provider]  allopurinol (ZYLOPRIM) 300 MG tablet Take 1 tablet (300 mg total) by mouth daily. 04/29/21   Benay Pike, MD  amLODipine (NORVASC) 10 MG tablet Take 10 mg by mouth daily.    [provider]  atorvastatin (LIPITOR) 80 MG tablet Take 80 mg by mouth  every morning.    [provider]  ezetimibe (ZETIA) 10 MG tablet Take 10 mg by mouth every morning.    [provider]  predniSONE (DELTASONE) 20 MG tablet Take 2 tablets (40 mg total) by mouth daily. Take 40 mg by mouth daily for 3 days, then 20mg  by mouth daily for 3 days, then 10mg  daily for 3 days Patient not taking: Reported on 04/29/2021 02/07/16   Antonietta Breach, PA-C  triamterene-hydrochlorothiazide (MAXZIDE-25) 37.5-25 MG tablet Take 1 tablet by mouth every morning.    [provider]     Family History  Problem Relation Age of Onset   Heart disease Mother     Social History   Socioeconomic History   Marital status: Single    Spouse name: Not on file   Number of children: Not on file   Years of education: Not on file   Highest education level: Not on file  Occupational History   Not on file  Tobacco Use   Smoking status: Never   Smokeless tobacco: Never  Vaping Use   Vaping Use: Never used  Substance and Sexual Activity   Alcohol use: No   Drug use: No   Sexual activity: Not on file  Other Topics Concern   Not on file  Social History Narrative   Not on file   Social Determinants of Health   Financial Resource Strain: Not on file  Food Insecurity: Not on file  Transportation Needs: Not on file  Physical Activity: Not on file  Stress: Not on file  Social Connections: Not on file    Review of Systems: A 12 point ROS discussed and pertinent positives are indicated in the HPI above.  All other systems are negative.  Review of Systems  Vital Signs: BP 107/60   Pulse 79   Temp 98.7 F (37.1 C) (Oral)   Resp 20   SpO2 99%   Physical Exam Constitutional:      General: She is not in acute distress.    Comments: Thin  HENT:     Mouth/Throat:     Mouth: Mucous membranes are moist.     Pharynx: Oropharynx is clear.  Cardiovascular:     Rate and Rhythm: Normal rate and regular rhythm.     Heart sounds: Normal heart sounds.   Pulmonary:     Effort: Pulmonary effort is normal. No respiratory distress.     Breath sounds: Normal breath sounds.  Skin:    General: Skin is warm.  Neurological:     General: No focal deficit present.     Mental Status: She is alert and oriented to person, place, and time.  Psychiatric:        Mood and Affect: Mood normal.        Thought Content: Thought content normal.        Judgment: Judgment normal.     Imaging: NM PET Image Initial (PI) Skull Base To Thigh  Result Date: 04/28/2021 CLINICAL DATA:  Initial treatment strategy for diffuse large B-cell lymphoma on recent biopsy of the nasopharynx. EXAM: NUCLEAR MEDICINE PET SKULL BASE TO THIGH TECHNIQUE: 4.96 mCi F-18 FDG was injected intravenously. Full-ring PET imaging was performed from the skull base to thigh after the radiotracer. CT data was obtained and used for attenuation correction and anatomic localization. Fasting blood glucose: 88 mg/dl COMPARISON:  Brain MRI 03/22/2021 FINDINGS: Mediastinal blood pool activity: SUV max 1.82 Liver activity: SUV max 2.17 NECK: The large left-sided nasopharyngeal mass is hypermetabolic with SUV max of 61.60. I do not however see any enlarged or hypermetabolic neck lymph nodes. Incidental CT findings: Multinodular thyroid goiter noted but no hypermetabolic foci. Bilateral carotid artery calcifications. Opacification of the left maxillary sinus. CHEST: Few small axillary lymph nodes bilateral are weakly hypermetabolic SUV max of 7.37. These are likely reactive. No breast masses are identified. No enlarged or hypermetabolic mediastinal or hilar lymph nodes. No worrisome pulmonary lesions or pulmonary nodules to suggest metastatic disease. Incidental CT findings: Advanced atherosclerotic calcifications involving the aorta and coronary arteries. ABDOMEN/PELVIS: There are 2 hypermetabolic right hepatic lobe lesions the more superior lesion measures 3.6 cm and has SUV max of 6.30. The slightly more inferior  and posterior lesion measures 3.4 cm and the SUV max is 8.18 No adrenal gland lesions. No hypermetabolic abdominal or pelvic lymphadenopathy. The spleen is normal in size. No hypermetabolism. Incidental CT findings: Advanced atherosclerotic calcifications involving the aorta iliac arteries. No focal aneurysm. Calcified uterine fibroids are noted. SKELETON: Diffuse osseous metastatic disease. This involves the axial and appendicular skeleton. There are bilateral humeral lesions, right clavicle lesions, right scapular lesion, left sided sternal manubrial lesion, rib lesions, bony pelvic lesions and bilateral hip lesions. These are lytic destructive bone lesions. Index lesions: Left sternal lesion measures 17 mm and SUV max is 7.65. Upper right sacral lesion measures 2.7 cm and SUV max is 7.69 Right hip lesion at the level of the lesser trochanter has an SUV  max of 10.60. Left subtrochanteric femoral lesion has an SUV max of 8.03 Incidental CT findings: none IMPRESSION: 1. Hypermetabolic left nasopharyngeal mass consistent with known biopsy proven lymphoma. No associated neck adenopathy. 2. Two large hypermetabolic liver lesions have the appearance of metastatic disease. 3. Diffuse destructive lytic metastatic bone lesions involving the axial and appendicular skeleton as detailed above. 4. Normal appearance of the spleen and no adenopathy involving the chest, abdomen or pelvis. 5. Somewhat unusual presentation for lymphoma. Possible second primary? No obvious findings for breast cancer or lung cancer or colon cancer. Possible consideration for liver or bone biopsy. Electronically Signed   By: Marijo Sanes M.D.   On: 04/28/2021 12:20   ECHOCARDIOGRAM COMPLETE  Result Date: 05/01/2021    ECHOCARDIOGRAM REPORT   Patient Name:   [name] Date of Exam: 05/01/2021 Medical Rec #:  [id number]   Height:       59.0 in Accession #:    [id number]  Weight:       97.0 lb Date of Birth:  1932-05-20   BSA:          1.356 m  Patient Age:    25 years    BP:           105/63 mmHg Patient Gender: F           HR:           75 bpm. Exam Location:  Outpatient Procedure: 2D Echo, 3D Echo, Cardiac Doppler, Color Doppler and Strain Analysis Indications:    ; Z51.11 Encounter for antineoplastic chemotheraphy  History:        Patient has prior history of Echocardiogram examinations, most                 recent 12/18/2015. Risk Factors:Hypertension, Dyslipidemia and                 GERD. Anemia.  Sonographer:    Tiffany Dance Referring Phys: [id number] [place]  1. Left ventricular ejection fraction by 3D volume is 68 %. The left ventricle has normal function. The left ventricle demonstrates regional wall motion abnormalities (see scoring diagram/findings for description). There is mild left ventricular hypertrophy of the basal-septal segment. Left ventricular diastolic parameters are consistent with Grade I diastolic dysfunction (impaired relaxation). The average left ventricular global longitudinal strain is -22.4 %. The global longitudinal strain is normal.  2. Right ventricular systolic function is normal. The right ventricular size is normal. There is normal pulmonary artery systolic pressure.  3. Left atrial size was moderately dilated.  4. The mitral valve is normal in structure. No evidence of mitral valve regurgitation. No evidence of mitral stenosis.  5. The aortic valve is normal in structure. Aortic valve regurgitation is not visualized. Mild aortic valve sclerosis is present, with no evidence of aortic valve stenosis.  6. The inferior vena cava is normal in size with greater than 50% respiratory variability, suggesting right atrial pressure of 3 mmHg. FINDINGS  Left Ventricle: Left ventricular ejection fraction by 3D volume is 68 %. The left ventricle has normal function. The left ventricle demonstrates regional wall motion abnormalities. The average left ventricular global longitudinal strain is -22.4 %. The global  longitudinal strain is normal. The left ventricular internal cavity size was normal in size. There is mild left ventricular hypertrophy of the basal-septal segment. Left ventricular diastolic parameters are consistent with Grade I diastolic dysfunction (impaired relaxation).  LV Wall Scoring: The basal inferior segment is akinetic. Right  Ventricle: The right ventricular size is normal. No increase in right ventricular wall thickness. Right ventricular systolic function is normal. There is normal pulmonary artery systolic pressure. The tricuspid regurgitant velocity is 2.27 m/s, and  with an assumed right atrial pressure of 3 mmHg, the estimated right ventricular systolic pressure is 95.6 mmHg. Left Atrium: Left atrial size was moderately dilated. Right Atrium: Right atrial size was normal in size. Pericardium: There is no evidence of pericardial effusion. Mitral Valve: The mitral valve is normal in structure. No evidence of mitral valve regurgitation. No evidence of mitral valve stenosis. Tricuspid Valve: The tricuspid valve is normal in structure. Tricuspid valve regurgitation is not demonstrated. No evidence of tricuspid stenosis. Aortic Valve: The aortic valve is normal in structure. Aortic valve regurgitation is not visualized. Mild aortic valve sclerosis is present, with no evidence of aortic valve stenosis. Pulmonic Valve: The pulmonic valve was normal in structure. Pulmonic valve regurgitation is trivial. No evidence of pulmonic stenosis. Aorta: The aortic root is normal in size and structure. Venous: The inferior vena cava is normal in size with greater than 50% respiratory variability, suggesting right atrial pressure of 3 mmHg. IAS/Shunts: No atrial level shunt detected by color flow Doppler.  LEFT VENTRICLE PLAX 2D LVIDd:         3.10 cm         Diastology LVIDs:         1.90 cm         LV e' medial:    4.90 cm/s LV PW:         1.10 cm         LV E/e' medial:  14.7 LV IVS:        1.10 cm         LV e'  lateral:   5.55 cm/s LVOT diam:     1.90 cm         LV E/e' lateral: 13.0 LV SV:         56 LV SV Index:   41              2D LVOT Area:     2.84 cm        Longitudinal                                Strain                                2D Strain GLS  -22.4 %                                Avg:                                 3D Volume EF                                LV 3D EF:    Left                                             ventricular  ejection                                             fraction by                                             3D volume                                             is 68 %.                                 3D Volume EF:                                3D EF:        68 %                                LV EDV:       77 ml                                LV ESV:       24 ml                                LV SV:        52 ml RIGHT VENTRICLE             IVC RV Basal diam:  2.60 cm     IVC diam: 1.20 cm RV S prime:     14.10 cm/s TAPSE (M-mode): 2.0 cm LEFT ATRIUM             Index       RIGHT ATRIUM           Index LA diam:        3.70 cm 2.73 cm/m  RA Area:     10.90 cm LA Vol (A2C):   59.7 ml 44.02 ml/m RA Volume:   23.30 ml  17.18 ml/m LA Vol (A4C):   57.6 ml 42.48 ml/m LA Biplane Vol: 60.4 ml 44.54 ml/m  AORTIC VALVE LVOT Vmax:   98.55 cm/s LVOT Vmean:  66.000 cm/s LVOT VTI:    0.198 m  AORTA Ao Root diam: 3.20 cm Ao Asc diam:  3.00 cm MITRAL VALVE               TRICUSPID VALVE MV Area (PHT): 3.27 cm    TR Peak grad:   20.6 mmHg MV Decel Time: 232 msec    TR Vmax:        227.00 cm/s MV E velocity: 72.20 cm/s MV A velocity: 98.50 cm/s  SHUNTS MV E/A ratio:  0.73        Systemic VTI:  0.20 m  Systemic Diam: 1.90 cm Candee Furbish MD Electronically signed by Candee Furbish MD Signature Date/Time: 05/01/2021/12:21:26 PM    Final     Labs:  CBC: Recent Labs    03/22/21 1929 04/08/21 0923 04/15/21 1121  04/29/21 0954  WBC 10.3 7.1 8.0 9.8  HGB 12.0 9.4* 9.4* 9.5*  HCT 37.5 30.2* 29.0* 29.9*  PLT 314 234 232 370    COAGS: Recent Labs    03/22/21 1929  INR 1.0  APTT 25    BMP: Recent Labs    03/22/21 2231 04/08/21 0923 04/15/21 1121 04/29/21 0954  NA 135 138 140 144  K 3.0* 2.9* 4.0 3.2*  CL 102 98 97* 100  CO2 23 28 30 28   GLUCOSE 119* 97 88 110*  BUN 11 19 12 12   CALCIUM 8.1* 9.2 9.3 9.7  CREATININE 0.61 0.67 0.62 0.69  GFRNONAA >60 >60 >60 >60    LIVER FUNCTION TESTS: Recent Labs    03/22/21 2231 04/15/21 1121 04/29/21 0954  BILITOT 0.6 0.4 0.5  AST 27 33 30  ALT 20 22 21   ALKPHOS 88 118 121  PROT 7.0 8.0 7.9  ALBUMIN 2.9* 3.2* 3.2*     Assessment and Plan: B cell Lymphoma For port placement Risks and benefits of image guided port-a-catheter placement was discussed with the patient including, but not limited to bleeding, infection, pneumothorax, or fibrin sheath development and need for additional procedures.  All of the patient's questions were answered, patient is agreeable to proceed. Consent signed and in chart.   Thank you for this interesting consult.  I greatly enjoyed meeting Valeree I Zaucha and look forward to participating in their care.  A copy of this report was sent to the requesting provider on this date.  Electronically Signed: Ascencion Dike, PA-C 05/06/2021, 1:16 PM   I spent a total of in face to face in clinical consultation, greater than 50% of which was counseling/coordinating care for port placement

## 2021-05-06 NOTE — Discharge Instructions (Signed)
Interventional radiology phone numbers °[phone] °After hours [phone] ° ° ° °You have skin glue (dermabond) over your new port. Do not use the lidocaine cream (EMLA cream) over the skin glue until it has healed. The petroleum in the lidocaine cream will dissolve the skin glue resulting in an infection of your new port. Use ice in a zip lock bag for 1-2 minutes over your new port before the cancer center nurses access your port. ° ° °Implanted Port Insertion, Care After °This sheet gives you information about how to care for yourself after your procedure. Your health care provider may also give you more specific instructions. If you have problems or questions, contact your health care provider. °What can I expect after the procedure? °After the procedure, it is common to have: °Discomfort at the port insertion site. °Bruising on the skin over the port. This should improve over 3-4 days. °Follow these instructions at home: °Port care °After your port is placed, you will get a manufacturer's information card. The card has information about your port. Keep this card with you at all times. °Take care of the port as told by your health care provider. Ask your health care provider if you or a family member can get training for taking care of the port at home. A home health care nurse may also take care of the port. °Make sure to remember what type of port you have. °Incision care °Follow instructions from your health care provider about how to take care of your port insertion site. Make sure you: °Wash your hands with soap and water before and after you change your bandage (dressing). If soap and water are not available, use hand sanitizer. °Change your dressing as told by your health care provider. °Leave skin glue in place. These skin closures may need to stay in place for 2 weeks or longer.  °Check your port insertion site every day for signs of infection. Check for: °Redness, swelling, or pain. °Fluid or  blood. °Warmth. °Pus or a bad smell.  °  °  °Activity °Return to your normal activities as told by your health care provider. Ask your health care provider what activities are safe for you. °Do not lift anything that is heavier than 10 lb (4.5 kg), or the limit that you are told, until your health care provider says that it is safe. °General instructions °Take over-the-counter and prescription medicines only as told by your health care provider. °Do not take baths, swim, or use a hot tub until your health care provider approves.You may remove your dressing tomorrow and shower 24 hours after your procedure. °Do not drive for 24 hours if you were given a sedative during your procedure. °Wear a medical alert bracelet in case of an emergency. This will tell any health care providers that you have a port. °Keep all follow-up visits as told by your health care provider. This is important. °Contact a health care provider if: °You cannot flush your port with saline as directed, or you cannot draw blood from the port. °You have a fever or chills. °You have redness, swelling, or pain around your port insertion site. °You have fluid or blood coming from your port insertion site. °Your port insertion site feels warm to the touch. °You have pus or a bad smell coming from the port insertion site. °Get help right away if: °You have chest pain or shortness of breath. °You have bleeding from your port that you cannot control. °Summary °Take care of   the port as told by your health care provider. Keep the manufacturer's information card with you at all times. °Change your dressing as told by your health care provider. °Contact a health care provider if you have a fever or chills or if you have redness, swelling, or pain around your port insertion site. °Keep all follow-up visits as told by your health care provider. °This information is not intended to replace advice given to you by your health care provider. Make sure you discuss any  questions you have with your health care provider. °Document Revised: 06/06/2018 Document Reviewed: 06/06/2018 °Elsevier Patient Education © 2021 [employer] ° ° ° °Moderate Conscious Sedation, Adult, Care After °This sheet gives you information about how to care for yourself after your procedure. Your health care provider may also give you more specific instructions. If you have problems or questions, contact your health care provider. °What can I expect after the procedure? °After the procedure, it is common to have: °Sleepiness for several hours. °Impaired judgment for several hours. °Difficulty with balance. °Vomiting if you eat too soon. °Follow these instructions at home: °For the time period you were told by your health care provider: °Rest. °Do not participate in activities where you could fall or become injured. °Do not drive or use machinery. °Do not drink alcohol. °Do not take sleeping pills or medicines that cause drowsiness. °Do not make important decisions or sign legal documents. °Do not take care of children on your own.  °  °  °Eating and drinking °Follow the diet recommended by your health care provider. °Drink enough fluid to keep your urine pale yellow. °If you vomit: °Drink water, juice, or soup when you can drink without vomiting. °Make sure you have little or no nausea before eating solid foods.   °General instructions °Take over-the-counter and prescription medicines only as told by your health care provider. °Have a responsible adult stay with you for the time you are told. It is important to have someone help care for you until you are awake and alert. °Do not smoke. °Keep all follow-up visits as told by your health care provider. This is important. °Contact a health care provider if: °You are still sleepy or having trouble with balance after 24 hours. °You feel light-headed. °You keep feeling nauseous or you keep vomiting. °You develop a rash. °You have a fever. °You have redness or  swelling around the IV site. °Get help right away if: °You have trouble breathing. °You have new-onset confusion at home. °Summary °After the procedure, it is common to feel sleepy, have impaired judgment, or feel nauseous if you eat too soon. °Rest after you get home. Know the things you should not do after the procedure. °Follow the diet recommended by your health care provider and drink enough fluid to keep your urine pale yellow. °Get help right away if you have trouble breathing or new-onset confusion at home. °This information is not intended to replace advice given to you by your health care provider. Make sure you discuss any questions you have with your health care provider. °Document Revised: 03/07/2020 Document Reviewed: 10/04/2019 °Elsevier Patient Education © 2021 [employer]  °

## 2021-05-11 ENCOUNTER — Encounter (HOSPITAL_COMMUNITY): Payer: Self-pay

## 2021-05-11 ENCOUNTER — Telehealth: Payer: Self-pay | Admitting: Hematology and Oncology

## 2021-05-11 NOTE — Progress Notes (Unsigned)
Patient Demographics  Patient Name  [name], [name] Legal Sex  Female DOB  1931/12/13 SSN  [id number] Address  [address] [address] Phone  [phone] (Home) *Preferred*  [phone] (Mobile)    Biopsy Received: Today Aletta Edouard, MD  Donn Pierini D Dr. Chryl Heck just called me. Apparently this patient is scheduled for a bone marrow biopsy this week. We need to change it to a CT guided biopsy of right iliac bone lesion. She does not need [place] to sample the marrow. We decided on this first over a liver lesion biopsy to minimize risk in an 88 year old. This will serve as approval of change, and please make sure site knows.   Thanks,   GY

## 2021-05-11 NOTE — Telephone Encounter (Signed)
Scheduled appt per 6/20 sch msg. Pt's son is aware.

## 2021-05-13 ENCOUNTER — Other Ambulatory Visit: Payer: Self-pay | Admitting: Student

## 2021-05-13 ENCOUNTER — Ambulatory Visit: Payer: Medicare HMO | Admitting: Hematology and Oncology

## 2021-05-13 NOTE — H&P (Signed)
Chief Complaint: Patient was seen in consultation today for right iliac bone lesion/biopsy.  Referring Physician(s): Benay Pike (oncology)  Supervising Physician: Sandi Mariscal  Patient Status: [facility] - Out-pt  History of Present Illness: [name] is a 87 y.o. female with a past medical history of hypertension, hyperlipidemia, hiatal hernia, GERD, lymphoma, anemia, and arthritis. She was unfortunately diagnosed with diffuse large B-cell lymphoma in 03/2021 (via nasopharyngeal mass biopsy 04/08/2021). Her cancer is managed by Dr. Chryl Heck. She has tentative plans to begin systemic chemotherapy as management, and had a Port-a-cath placed in IR 05/06/2021 for administration purposes. Staging NM PET concerning for possible metastatic disease.  NM 04/28/2021: 1. Hypermetabolic left nasopharyngeal mass consistent with known biopsy proven lymphoma. No associated neck adenopathy. 2. Two large hypermetabolic liver lesions have the appearance of metastatic disease. 3. Diffuse destructive lytic metastatic bone lesions involving the axial and appendicular skeleton as detailed above. 4. Normal appearance of the spleen and no adenopathy involving the chest, abdomen or pelvis. 5. Somewhat unusual presentation for lymphoma. Possible second primary? No obvious findings for breast cancer or lung cancer or colon cancer. Possible consideration for liver or bone biopsy.  IR consulted by Dr. Chryl Heck for possible image-guided right iliac bone lesion biopsy. Patient awake and alert laying in bed with no complaints at this time. Denies fever, chills, chest pain, dyspnea, abdominal pain, or headache.   Past Medical History:  Diagnosis Date   Anemia    Arthritis    knees    GERD (gastroesophageal reflux disease)    Hiatal hernia    History of inguinal hernia repair    BIH   Hyperlipidemia    Hypertension    Memory problem     Past Surgical History:  Procedure Laterality Date   COLONOSCOPY     HERNIA  REPAIR  10/13/11   lap BIH rep w/ mesh- Dr. Tora Kindred HERNIA REPAIR  10/13/2011   Procedure: LAPAROSCOPIC INGUINAL HERNIA;  Surgeon: Adin Hector, MD;  Location: [facility] ORS;  Service: General;  Laterality: Bilateral;  Laparoscopic bilateral inguinal hernia repair, converted to open bilateral inguinal hernia repairs with mesh   IR IMAGING GUIDED PORT INSERTION  05/06/2021   NASAL ENDOSCOPY Left 04/08/2021   Procedure: NASAL ENDOSCOPY WITH BIOPSY OF LEFT NASOPPHARYNGEAL MASS;  Surgeon: Jason Coop, DO;  Location: [facility];  Service: ENT;  Laterality: Left;  Frozen Section    Allergies: Patient has no known allergies.  Medications: Prior to Admission medications   Medication Sig Start Date End Date Taking? Authorizing Provider  acetaminophen (TYLENOL) 500 MG tablet Take 1,000 mg by mouth every 6 (six) hours as needed for mild pain or fever.    [provider]  allopurinol (ZYLOPRIM) 300 MG tablet Take 1 tablet (300 mg total) by mouth daily. 04/29/21   Benay Pike, MD  amLODipine (NORVASC) 10 MG tablet Take 10 mg by mouth daily.    [provider]  atorvastatin (LIPITOR) 80 MG tablet Take 80 mg by mouth every morning.    [provider]  ezetimibe (ZETIA) 10 MG tablet Take 10 mg by mouth every morning.    [provider]  predniSONE (DELTASONE) 20 MG tablet Take 2 tablets (40 mg total) by mouth daily. Take 40 mg by mouth daily for 3 days, then 20mg  by mouth daily for 3 days, then 10mg  daily for 3 days Patient not taking: Reported on 04/29/2021 02/07/16   Antonietta Breach, PA-C  triamterene-hydrochlorothiazide (MAXZIDE-25) 37.5-25 MG tablet  Take 1 tablet by mouth every morning.    [provider]     Family History  Problem Relation Age of Onset   Heart disease Mother     Social History   Socioeconomic History   Marital status: Single    Spouse name: Not on file   Number of children: Not on file   Years of education: Not on file    Highest education level: Not on file  Occupational History   Not on file  Tobacco Use   Smoking status: Never   Smokeless tobacco: Never  Vaping Use   Vaping Use: Never used  Substance and Sexual Activity   Alcohol use: No   Drug use: No   Sexual activity: Not on file  Other Topics Concern   Not on file  Social History Narrative   Not on file   Social Determinants of Health   Financial Resource Strain: Not on file  Food Insecurity: Not on file  Transportation Needs: Not on file  Physical Activity: Not on file  Stress: Not on file  Social Connections: Not on file     Review of Systems: A 12 point ROS discussed and pertinent positives are indicated in the HPI above.  All other systems are negative.  Review of Systems  Constitutional:  Negative for chills and fever.  Respiratory:  Negative for shortness of breath and wheezing.   Cardiovascular:  Negative for chest pain and palpitations.  Gastrointestinal:  Negative for abdominal pain.  Neurological:  Negative for headaches.  Psychiatric/Behavioral:  Negative for confusion.    Vital Signs: BP 110/74   Pulse 87   Temp 98.3 F (36.8 C) (Oral)   Resp 18   SpO2 96%   Physical Exam Vitals and nursing note reviewed.  Constitutional:      General: She is not in acute distress. Cardiovascular:     Rate and Rhythm: Normal rate and regular rhythm.     Heart sounds: Normal heart sounds. No murmur heard. Pulmonary:     Effort: Pulmonary effort is normal. No respiratory distress.     Breath sounds: Normal breath sounds. No wheezing.  Skin:    General: Skin is warm and dry.  Neurological:     Mental Status: She is alert and oriented to person, place, and time.     MD Evaluation Airway: WNL Heart: WNL Abdomen: WNL Chest/ Lungs: WNL ASA  Classification: 3 Mallampati/Airway Score: One   Imaging: NM PET Image Initial (PI) Skull Base To Thigh  Result Date: 04/28/2021 CLINICAL DATA:  Initial treatment strategy for  diffuse large B-cell lymphoma on recent biopsy of the nasopharynx. EXAM: NUCLEAR MEDICINE PET SKULL BASE TO THIGH TECHNIQUE: 4.96 mCi F-18 FDG was injected intravenously. Full-ring PET imaging was performed from the skull base to thigh after the radiotracer. CT data was obtained and used for attenuation correction and anatomic localization. Fasting blood glucose: 88 mg/dl COMPARISON:  Brain MRI 03/22/2021 FINDINGS: Mediastinal blood pool activity: SUV max 1.82 Liver activity: SUV max 2.17 NECK: The large left-sided nasopharyngeal mass is hypermetabolic with SUV max of 95.09. I do not however see any enlarged or hypermetabolic neck lymph nodes. Incidental CT findings: Multinodular thyroid goiter noted but no hypermetabolic foci. Bilateral carotid artery calcifications. Opacification of the left maxillary sinus. CHEST: Few small axillary lymph nodes bilateral are weakly hypermetabolic SUV max of 3.26. These are likely reactive. No breast masses are identified. No enlarged or hypermetabolic mediastinal or hilar lymph nodes. No worrisome pulmonary lesions  or pulmonary nodules to suggest metastatic disease. Incidental CT findings: Advanced atherosclerotic calcifications involving the aorta and coronary arteries. ABDOMEN/PELVIS: There are 2 hypermetabolic right hepatic lobe lesions the more superior lesion measures 3.6 cm and has SUV max of 6.30. The slightly more inferior and posterior lesion measures 3.4 cm and the SUV max is 8.18 No adrenal gland lesions. No hypermetabolic abdominal or pelvic lymphadenopathy. The spleen is normal in size. No hypermetabolism. Incidental CT findings: Advanced atherosclerotic calcifications involving the aorta iliac arteries. No focal aneurysm. Calcified uterine fibroids are noted. SKELETON: Diffuse osseous metastatic disease. This involves the axial and appendicular skeleton. There are bilateral humeral lesions, right clavicle lesions, right scapular lesion, left sided sternal manubrial  lesion, rib lesions, bony pelvic lesions and bilateral hip lesions. These are lytic destructive bone lesions. Index lesions: Left sternal lesion measures 17 mm and SUV max is 7.65. Upper right sacral lesion measures 2.7 cm and SUV max is 7.69 Right hip lesion at the level of the lesser trochanter has an SUV max of 10.60. Left subtrochanteric femoral lesion has an SUV max of 8.03 Incidental CT findings: none IMPRESSION: 1. Hypermetabolic left nasopharyngeal mass consistent with known biopsy proven lymphoma. No associated neck adenopathy. 2. Two large hypermetabolic liver lesions have the appearance of metastatic disease. 3. Diffuse destructive lytic metastatic bone lesions involving the axial and appendicular skeleton as detailed above. 4. Normal appearance of the spleen and no adenopathy involving the chest, abdomen or pelvis. 5. Somewhat unusual presentation for lymphoma. Possible second primary? No obvious findings for breast cancer or lung cancer or colon cancer. Possible consideration for liver or bone biopsy. Electronically Signed   By: Marijo Sanes M.D.   On: 04/28/2021 12:20   ECHOCARDIOGRAM COMPLETE  Result Date: 05/01/2021    ECHOCARDIOGRAM REPORT   Patient Name:   [name] Date of Exam: 05/01/2021 Medical Rec #:  [id number]   Height:       59.0 in Accession #:    [id number]  Weight:       97.0 lb Date of Birth:  [birth year]   BSA:          1.356 m Patient Age:    72 years    BP:           105/63 mmHg Patient Gender: F           HR:           75 bpm. Exam Location:  Outpatient Procedure: 2D Echo, 3D Echo, Cardiac Doppler, Color Doppler and Strain Analysis Indications:    ; Z51.11 Encounter for antineoplastic chemotheraphy  History:        Patient has prior history of Echocardiogram examinations, most                 recent 12/18/2015. Risk Factors:Hypertension, Dyslipidemia and                 GERD. Anemia.  Sonographer:    Tiffany Dance Referring Phys: [id number] [place]  1. Left  ventricular ejection fraction by 3D volume is 68 %. The left ventricle has normal function. The left ventricle demonstrates regional wall motion abnormalities (see scoring diagram/findings for description). There is mild left ventricular hypertrophy of the basal-septal segment. Left ventricular diastolic parameters are consistent with Grade I diastolic dysfunction (impaired relaxation). The average left ventricular global longitudinal strain is -22.4 %. The global longitudinal strain is normal.  2. Right ventricular systolic function is normal. The right ventricular size is  normal. There is normal pulmonary artery systolic pressure.  3. Left atrial size was moderately dilated.  4. The mitral valve is normal in structure. No evidence of mitral valve regurgitation. No evidence of mitral stenosis.  5. The aortic valve is normal in structure. Aortic valve regurgitation is not visualized. Mild aortic valve sclerosis is present, with no evidence of aortic valve stenosis.  6. The inferior vena cava is normal in size with greater than 50% respiratory variability, suggesting right atrial pressure of 3 mmHg. FINDINGS  Left Ventricle: Left ventricular ejection fraction by 3D volume is 68 %. The left ventricle has normal function. The left ventricle demonstrates regional wall motion abnormalities. The average left ventricular global longitudinal strain is -22.4 %. The global longitudinal strain is normal. The left ventricular internal cavity size was normal in size. There is mild left ventricular hypertrophy of the basal-septal segment. Left ventricular diastolic parameters are consistent with Grade I diastolic dysfunction (impaired relaxation).  LV Wall Scoring: The basal inferior segment is akinetic. Right Ventricle: The right ventricular size is normal. No increase in right ventricular wall thickness. Right ventricular systolic function is normal. There is normal pulmonary artery systolic pressure. The tricuspid regurgitant  velocity is 2.27 m/s, and  with an assumed right atrial pressure of 3 mmHg, the estimated right ventricular systolic pressure is 30.8 mmHg. Left Atrium: Left atrial size was moderately dilated. Right Atrium: Right atrial size was normal in size. Pericardium: There is no evidence of pericardial effusion. Mitral Valve: The mitral valve is normal in structure. No evidence of mitral valve regurgitation. No evidence of mitral valve stenosis. Tricuspid Valve: The tricuspid valve is normal in structure. Tricuspid valve regurgitation is not demonstrated. No evidence of tricuspid stenosis. Aortic Valve: The aortic valve is normal in structure. Aortic valve regurgitation is not visualized. Mild aortic valve sclerosis is present, with no evidence of aortic valve stenosis. Pulmonic Valve: The pulmonic valve was normal in structure. Pulmonic valve regurgitation is trivial. No evidence of pulmonic stenosis. Aorta: The aortic root is normal in size and structure. Venous: The inferior vena cava is normal in size with greater than 50% respiratory variability, suggesting right atrial pressure of 3 mmHg. IAS/Shunts: No atrial level shunt detected by color flow Doppler.  LEFT VENTRICLE PLAX 2D LVIDd:         3.10 cm         Diastology LVIDs:         1.90 cm         LV e' medial:    4.90 cm/s LV PW:         1.10 cm         LV E/e' medial:  14.7 LV IVS:        1.10 cm         LV e' lateral:   5.55 cm/s LVOT diam:     1.90 cm         LV E/e' lateral: 13.0 LV SV:         56 LV SV Index:   41              2D LVOT Area:     2.84 cm        Longitudinal                                Strain  2D Strain GLS  -22.4 %                                Avg:                                 3D Volume EF                                LV 3D EF:    Left                                             ventricular                                             ejection                                             fraction by                                              3D volume                                             is 68 %.                                 3D Volume EF:                                3D EF:        68 %                                LV EDV:       77 ml                                LV ESV:       24 ml                                LV SV:        52 ml RIGHT VENTRICLE             IVC RV Basal diam:  2.60 cm     IVC diam: 1.20 cm RV S prime:     14.10 cm/s TAPSE (M-mode): 2.0 cm LEFT ATRIUM             Index  RIGHT ATRIUM           Index LA diam:        3.70 cm 2.73 cm/m  RA Area:     10.90 cm LA Vol (A2C):   59.7 ml 44.02 ml/m RA Volume:   23.30 ml  17.18 ml/m LA Vol (A4C):   57.6 ml 42.48 ml/m LA Biplane Vol: 60.4 ml 44.54 ml/m  AORTIC VALVE LVOT Vmax:   98.55 cm/s LVOT Vmean:  66.000 cm/s LVOT VTI:    0.198 m  AORTA Ao Root diam: 3.20 cm Ao Asc diam:  3.00 cm MITRAL VALVE               TRICUSPID VALVE MV Area (PHT): 3.27 cm    TR Peak grad:   20.6 mmHg MV Decel Time: 232 msec    TR Vmax:        227.00 cm/s MV E velocity: 72.20 cm/s MV A velocity: 98.50 cm/s  SHUNTS MV E/A ratio:  0.73        Systemic VTI:  0.20 m                            Systemic Diam: 1.90 cm Candee Furbish MD Electronically signed by Candee Furbish MD Signature Date/Time: 05/01/2021/12:21:26 PM    Final    IR IMAGING GUIDED PORT INSERTION  Result Date: 05/06/2021 INDICATION: 87 year old female with diffuse large B-cell lymphoma EXAM: IMPLANTED PORT A CATH PLACEMENT WITH ULTRASOUND AND FLUOROSCOPIC GUIDANCE MEDICATIONS: None ANESTHESIA/SEDATION: Versed 1 mg IV; Fentanyl 50 mcg IV; Moderate Sedation Time:  19 minutes The patient was continuously monitored during the procedure by the interventional radiology nurse under my direct supervision. FLUOROSCOPY TIME:  0 minutes, 30 seconds (2 mGy) COMPLICATIONS: None immediate. PROCEDURE: The right neck and chest was prepped with chlorhexidine, and draped in the usual sterile fashion using maximum  barrier technique (cap and mask, sterile gown, sterile gloves, large sterile sheet, hand hygiene and cutaneous antiseptic). Local anesthesia was attained by infiltration with 1% lidocaine with epinephrine. Ultrasound demonstrated patency of the right internal jugular vein, and this was documented with an image. Under real-time ultrasound guidance, this vein was accessed with a 21 gauge micropuncture needle and image documentation was performed. A small dermatotomy was made at the access site with an 11 scalpel. A 0.018" wire was advanced into the SVC and the access needle exchanged for a 53F micropuncture vascular sheath. The 0.018" wire was then removed and a 0.035" wire advanced into the IVC. An appropriate location for the subcutaneous reservoir was selected below the clavicle and an incision was made through the skin and underlying soft tissues. The subcutaneous tissues were then dissected using a combination of blunt and sharp surgical technique and a pocket was formed. A single lumen low-profile power injectable portacatheter was then tunneled through the subcutaneous tissues from the pocket to the dermatotomy and the port reservoir placed within the subcutaneous pocket. The venous access site was then serially dilated and a peel away vascular sheath placed over the wire. The wire was removed and the port catheter advanced into position under fluoroscopic guidance. The catheter tip is positioned in the superior cavoatrial junction. This was documented with a spot image. The portacatheter was then tested and found to flush and aspirate well. The port was flushed with saline followed by 100 units/mL heparinized saline. The pocket was then closed in two layers using first subdermal inverted interrupted absorbable sutures followed  by a running subcuticular suture. The epidermis was then sealed with Dermabond. The dermatotomy at the venous access site was also closed with Dermabond. IMPRESSION: Successful placement  of a right IJ approach Power Port with ultrasound and fluoroscopic guidance. The catheter is ready for use. Electronically Signed   By: Jacqulynn Cadet M.D.   On: 05/06/2021 15:42    Labs:  CBC: Recent Labs    03/22/21 1929 04/08/21 0923 04/15/21 1121 04/29/21 0954  WBC 10.3 7.1 8.0 9.8  HGB 12.0 9.4* 9.4* 9.5*  HCT 37.5 30.2* 29.0* 29.9*  PLT 314 234 232 370    COAGS: Recent Labs    03/22/21 1929  INR 1.0  APTT 25    BMP: Recent Labs    03/22/21 2231 04/08/21 0923 04/15/21 1121 04/29/21 0954  NA 135 138 140 144  K 3.0* 2.9* 4.0 3.2*  CL 102 98 97* 100  CO2 23 28 30 28   GLUCOSE 119* 97 88 110*  BUN 11 19 12 12   CALCIUM 8.1* 9.2 9.3 9.7  CREATININE 0.61 0.67 0.62 0.69  GFRNONAA >60 >60 >60 >60    LIVER FUNCTION TESTS: Recent Labs    03/22/21 2231 04/15/21 1121 04/29/21 0954  BILITOT 0.6 0.4 0.5  AST 27 33 30  ALT 20 22 21   ALKPHOS 88 118 121  PROT 7.0 8.0 7.9  ALBUMIN 2.9* 3.2* 3.2*      Assessment and Plan:  Right iliac bone lesion in setting of diffuse large B-cell lymphoma. Plan for image-guided right iliac bone lesion biopsy today in IR. Patient is NPO. Afebrile.  Risks and benefits discussed with the patient including, but not limited to bleeding, infection, damage to adjacent structures or low yield requiring additional tests. All of the patient's questions were answered, patient is agreeable to proceed. Consent signed and in chart.   Thank you for this interesting consult.  I greatly enjoyed meeting [name] and look forward to participating in their care.  A copy of this report was sent to the requesting provider on this date.  Electronically Signed: Earley Abide, PA-C 05/14/2021, 8:20 AM   I spent a total of 15 Minutes in face to face in clinical consultation, greater than 50% of which was counseling/coordinating care for right iliac bone lesion/biopsy.

## 2021-05-14 ENCOUNTER — Emergency Department (HOSPITAL_BASED_OUTPATIENT_CLINIC_OR_DEPARTMENT_OTHER): Payer: Medicare HMO | Admitting: Radiology

## 2021-05-14 ENCOUNTER — Other Ambulatory Visit: Payer: Self-pay

## 2021-05-14 ENCOUNTER — Emergency Department (HOSPITAL_COMMUNITY)
Admission: RE | Admit: 2021-05-14 | Discharge: 2021-05-14 | Disposition: A | Discharge status: Home / Self Care | Payer: Medicare HMO | Source: Ambulatory Visit | Attending: Hematology and Oncology | Admitting: Hematology and Oncology

## 2021-05-14 ENCOUNTER — Encounter (HOSPITAL_BASED_OUTPATIENT_CLINIC_OR_DEPARTMENT_OTHER): Payer: Self-pay

## 2021-05-14 ENCOUNTER — Ambulatory Visit (HOSPITAL_COMMUNITY)
Admission: RE | Admit: 2021-05-14 | Discharge: 2021-05-14 | Disposition: A | Discharge status: Home / Self Care | Payer: Medicare HMO | Source: Ambulatory Visit | Attending: Hematology and Oncology | Admitting: Hematology and Oncology

## 2021-05-14 ENCOUNTER — Encounter (HOSPITAL_COMMUNITY): Payer: Self-pay

## 2021-05-14 ENCOUNTER — Emergency Department (HOSPITAL_BASED_OUTPATIENT_CLINIC_OR_DEPARTMENT_OTHER)
Admission: EM | Admit: 2021-05-14 | Discharge: 2021-05-15 | Disposition: A | Discharge status: Home / Self Care | Payer: Medicare HMO | Attending: Emergency Medicine | Admitting: Emergency Medicine

## 2021-05-14 DIAGNOSIS — M899 Disorder of bone, unspecified: Secondary | ICD-10-CM | POA: Diagnosis not present

## 2021-05-14 DIAGNOSIS — R0602 Shortness of breath: Secondary | ICD-10-CM | POA: Diagnosis not present

## 2021-05-14 DIAGNOSIS — M898X8 Other specified disorders of bone, other site: Secondary | ICD-10-CM | POA: Diagnosis not present

## 2021-05-14 DIAGNOSIS — C8510 Unspecified B-cell lymphoma, unspecified site: Secondary | ICD-10-CM | POA: Insufficient documentation

## 2021-05-14 DIAGNOSIS — I10 Essential (primary) hypertension: Secondary | ICD-10-CM | POA: Insufficient documentation

## 2021-05-14 DIAGNOSIS — C8330 Diffuse large B-cell lymphoma, unspecified site: Secondary | ICD-10-CM | POA: Insufficient documentation

## 2021-05-14 DIAGNOSIS — Z79899 Other long term (current) drug therapy: Secondary | ICD-10-CM | POA: Diagnosis not present

## 2021-05-14 DIAGNOSIS — C8590 Non-Hodgkin lymphoma, unspecified, unspecified site: Secondary | ICD-10-CM | POA: Diagnosis not present

## 2021-05-14 DIAGNOSIS — R609 Edema, unspecified: Secondary | ICD-10-CM | POA: Diagnosis present

## 2021-05-14 DIAGNOSIS — R0989 Other specified symptoms and signs involving the circulatory and respiratory systems: Secondary | ICD-10-CM | POA: Diagnosis not present

## 2021-05-14 DIAGNOSIS — R079 Chest pain, unspecified: Secondary | ICD-10-CM | POA: Diagnosis not present

## 2021-05-14 DIAGNOSIS — Z8572 Personal history of non-Hodgkin lymphomas: Secondary | ICD-10-CM | POA: Diagnosis not present

## 2021-05-14 DIAGNOSIS — K4021 Bilateral inguinal hernia, without obstruction or gangrene, recurrent: Secondary | ICD-10-CM | POA: Diagnosis not present

## 2021-05-14 DIAGNOSIS — C8331 Diffuse large B-cell lymphoma, lymph nodes of head, face, and neck: Secondary | ICD-10-CM | POA: Diagnosis not present

## 2021-05-14 DIAGNOSIS — J90 Pleural effusion, not elsewhere classified: Secondary | ICD-10-CM | POA: Diagnosis not present

## 2021-05-14 DIAGNOSIS — J9811 Atelectasis: Secondary | ICD-10-CM | POA: Diagnosis not present

## 2021-05-14 DIAGNOSIS — M7989 Other specified soft tissue disorders: Secondary | ICD-10-CM | POA: Diagnosis not present

## 2021-05-14 DIAGNOSIS — R600 Localized edema: Secondary | ICD-10-CM | POA: Diagnosis not present

## 2021-05-14 LAB — CBC WITH DIFFERENTIAL/PLATELET
Abs Immature Granulocytes: 0.02 K/uL (ref 0.00–0.07)
Basophils Absolute: 0 K/uL (ref 0.0–0.1)
Basophils Relative: 0 %
Eosinophils Absolute: 0 K/uL (ref 0.0–0.5)
Eosinophils Relative: 1 %
HCT: 24.4 % — ABNORMAL LOW (ref 36.0–46.0)
Hemoglobin: 7.6 g/dL — ABNORMAL LOW (ref 12.0–15.0)
Immature Granulocytes: 0 %
Lymphocytes Relative: 37 %
Lymphs Abs: 2.7 K/uL (ref 0.7–4.0)
MCH: 22.6 pg — ABNORMAL LOW (ref 26.0–34.0)
MCHC: 31.1 g/dL (ref 30.0–36.0)
MCV: 72.6 fL — ABNORMAL LOW (ref 80.0–100.0)
Monocytes Absolute: 0.8 K/uL (ref 0.1–1.0)
Monocytes Relative: 11 %
Neutro Abs: 3.7 K/uL (ref 1.7–7.7)
Neutrophils Relative %: 51 %
Platelets: 244 K/uL (ref 150–400)
RBC: 3.36 MIL/uL — ABNORMAL LOW (ref 3.87–5.11)
RDW: 18.5 % — ABNORMAL HIGH (ref 11.5–15.5)
WBC: 7.3 K/uL (ref 4.0–10.5)
nRBC: 0 % (ref 0.0–0.2)

## 2021-05-14 LAB — BASIC METABOLIC PANEL
Anion gap: 11 (ref 5–15)
BUN: 12 mg/dL (ref 8–23)
CO2: 27 mmol/L (ref 22–32)
Calcium: 9.2 mg/dL (ref 8.9–10.3)
Chloride: 103 mmol/L (ref 98–111)
Creatinine, Ser: 0.4 mg/dL — ABNORMAL LOW (ref 0.44–1.00)
GFR, Estimated: >60 mL/min (ref >60)
Glucose, Bld: 103 mg/dL — ABNORMAL HIGH (ref 70–99)
Potassium: 3.5 mmol/L (ref 3.5–5.1)
Sodium: 141 mmol/L (ref 135–145)

## 2021-05-14 LAB — CBC
HCT: 25.4 % — ABNORMAL LOW (ref 36.0–46.0)
Hemoglobin: 8 g/dL — ABNORMAL LOW (ref 12.0–15.0)
MCH: 22 pg — ABNORMAL LOW (ref 26.0–34.0)
MCHC: 31.5 g/dL (ref 30.0–36.0)
MCV: 69.8 fL — ABNORMAL LOW (ref 80.0–100.0)
Platelets: 241 10*3/uL (ref 150–400)
RBC: 3.64 MIL/uL — ABNORMAL LOW (ref 3.87–5.11)
RDW: 18.2 % — ABNORMAL HIGH (ref 11.5–15.5)
WBC: 8.5 10*3/uL (ref 4.0–10.5)
nRBC: 0 % (ref 0.0–0.2)

## 2021-05-14 LAB — BRAIN NATRIURETIC PEPTIDE: B Natriuretic Peptide: 53.2 pg/mL (ref 0.0–100.0)

## 2021-05-14 LAB — TROPONIN I (HIGH SENSITIVITY): Troponin I (High Sensitivity): 5 ng/L (ref <18)

## 2021-05-14 MED ORDER — SODIUM CHLORIDE 0.9 % IV SOLN: INTRAVENOUS | Status: DC

## 2021-05-14 MED ORDER — FENTANYL CITRATE (PF) 100 MCG/2ML IJ SOLN
INTRAMUSCULAR | Status: AC
  Filled 2021-05-14: qty 2

## 2021-05-14 MED ORDER — IOHEXOL 9 MG/ML PO SOLN
500.0000 mL | Freq: Two times a day (BID) | ORAL | Status: DC | PRN
  Administered 2021-05-14: 500 mL via ORAL

## 2021-05-14 MED ORDER — MIDAZOLAM HCL 2 MG/2ML IJ SOLN
INTRAMUSCULAR | Status: AC | PRN
  Administered 2021-05-14 (×2): 0.5 mg via INTRAVENOUS

## 2021-05-14 MED ORDER — FENTANYL CITRATE (PF) 100 MCG/2ML IJ SOLN
INTRAMUSCULAR | Status: AC | PRN
  Administered 2021-05-14 (×2): 25 ug via INTRAVENOUS

## 2021-05-14 MED ORDER — LIDOCAINE HCL (PF) 1 % IJ SOLN
INTRAMUSCULAR | Status: AC | PRN
  Administered 2021-05-14: 10 mL

## 2021-05-14 MED ORDER — HEPARIN SOD (PORK) LOCK FLUSH 100 UNIT/ML IV SOLN
500.0000 [IU] | INTRAVENOUS | Status: AC | PRN
  Administered 2021-05-14: 500 [IU]
  Filled 2021-05-14: qty 5

## 2021-05-14 MED ORDER — MIDAZOLAM HCL 2 MG/2ML IJ SOLN
INTRAMUSCULAR | Status: AC
  Filled 2021-05-14: qty 2

## 2021-05-14 NOTE — Procedures (Signed)
Pre procedural Dx: Diffuse large B-cell lymphoma  Post procedural Dx: Same  Technically successful CT guided biopsy of lytic lesion involving the anterior aspect of the left ilium.    EBL: None.   Complications: None immediate.   Ronny Bacon, MD Pager #: [phone]

## 2021-05-14 NOTE — Discharge Instructions (Signed)
Please call Interventional Radiology clinic [phone] with any questions or concerns.  You may remove your dressing and shower tomorrow.   Bone Biopsy, Adult, Care After This sheet gives you information about how to care for yourself after your procedure. Your health care provider may also give you more specific instructions. If you have problems or questions, contact your health care provider. What can I expect after the procedure? After the procedure, it is common to have: Mild pain and tenderness. Swelling. Bruising. Follow these instructions at home: Puncture site care Follow instructions from your health care provider about how to take care of the puncture site. Make sure you: Wash your hands with soap and water before and after you change your bandage (dressing). If soap and water are not available, use hand sanitizer. Change your dressing as told by your health care provider. Check your puncture site every day for signs of infection. Check for: More redness, swelling, or pain. Fluid or blood. Warmth. Pus or a bad smell.   Activity Return to your normal activities as told by your health care provider. Ask your health care provider what activities are safe for you. Do not lift anything that is heavier than 10 lb (4.5 kg), or the limit that you are told, until your health care provider says that it is safe. Do not drive for 24 hours if you were given a sedative during your procedure. General instructions Take over-the-counter and prescription medicines only as told by your health care provider. Do not take baths, swim, or use a hot tub until your health care provider approves. Ask your health care provider if you may take showers. You may only be allowed to take sponge baths. If directed, put ice on the affected area. To do this: Put ice in a plastic bag. Place a towel between your skin and the bag. Leave the ice on for 20 minutes, 2-3 times a day. Keep all follow-up visits as  told by your health care provider. This is important.   Contact a health care provider if: Your pain is not controlled with medicine. You have a fever. You have more redness, swelling, or pain around the puncture site. You have fluid or blood coming from the puncture site. Your puncture site feels warm to the touch. You have pus or a bad smell coming from the puncture site. Summary After the procedure, it is common to have mild pain, tenderness, swelling, and bruising. Follow instructions from your health care provider about how to take care of the puncture site and what activities are safe for you. Take over-the-counter and prescription medicines only as told by your health care provider. Contact a health care provider if you have any signs of infection, such as fluid or blood coming from the puncture site. This information is not intended to replace advice given to you by your health care provider. Make sure you discuss any questions you have with your health care provider. Document Revised: 03/27/2019 Document Reviewed: 03/27/2019 Elsevier Patient Education  2021 [place].   Moderate Conscious Sedation, Adult, Care After This sheet gives you information about how to care for yourself after your procedure. Your health care provider may also give you more specific instructions. If you have problems or questions, contact your health care provider. What can I expect after the procedure? After the procedure, it is common to have: Sleepiness for several hours. Impaired judgment for several hours. Difficulty with balance. Vomiting if you eat too soon. Follow these instructions at  home: For the time period you were told by your health care provider: Rest. Do not participate in activities where you could fall or become injured. Do not drive or use machinery. Do not drink alcohol. Do not take sleeping pills or medicines that cause drowsiness. Do not make important decisions or sign  legal documents. Do not take care of children on your own.      Eating and drinking Follow the diet recommended by your health care provider. Drink enough fluid to keep your urine pale yellow. If you vomit: Drink water, juice, or soup when you can drink without vomiting. Make sure you have little or no nausea before eating solid foods.   General instructions Take over-the-counter and prescription medicines only as told by your health care provider. Have a responsible adult stay with you for the time you are told. It is important to have someone help care for you until you are awake and alert. Do not smoke. Keep all follow-up visits as told by your health care provider. This is important. Contact a health care provider if: You are still sleepy or having trouble with balance after 24 hours. You feel light-headed. You keep feeling nauseous or you keep vomiting. You develop a rash. You have a fever. You have redness or swelling around the IV site. Get help right away if: You have trouble breathing. You have new-onset confusion at home. Summary After the procedure, it is common to feel sleepy, have impaired judgment, or feel nauseous if you eat too soon. Rest after you get home. Know the things you should not do after the procedure. Follow the diet recommended by your health care provider and drink enough fluid to keep your urine pale yellow. Get help right away if you have trouble breathing or new-onset confusion at home. This information is not intended to replace advice given to you by your health care provider. Make sure you discuss any questions you have with your health care provider. Document Revised: 03/07/2020 Document Reviewed: 10/04/2019 Elsevier Patient Education  2021 [employer].

## 2021-05-14 NOTE — ED Triage Notes (Signed)
Pt was sent from [facility] - pt came concerning swelling both legs that started  a week  ago and was told at  [facility] that she has  fluids on her lungs   Pt c/o SOB and chest pain  -  pt has no signs of distress   Pmx - HTN , HDL

## 2021-05-14 NOTE — ED Provider Notes (Signed)
[facility] EMERGENCY DEPT Provider Note   CSN: [id number] Arrival date & time: 05/14/21  2122     History Chief Complaint  Patient presents with   Shortness of Breath    [name] is a 87 y.o. female.  87 year old female who presents to the emerged part today secondary to concern for fluid on her legs.  Patient's currently being worked up for cancer just had a liver biopsy today.  Sounds like she was having some incontinence so they stopped her triamterene HCTZ combo medication a couple weeks ago.  Since that time she had progressively worsening lower extremity edema.  She try to get appoint with her doctor they told her to stop the Norvasc and go to urgent care walk-in clinic.  She went there and they told her they could not help her because she might have fluid on her lungs.  She has had some intermittent chest pain over the last few weeks but nothing consistent.  No fever or cough.  No other associated symptoms.   Shortness of Breath     Past Medical History:  Diagnosis Date   Anemia    Arthritis    knees    GERD (gastroesophageal reflux disease)    Hiatal hernia    History of inguinal hernia repair    BIH   Hyperlipidemia    Hypertension    Memory problem     Patient Active Problem List   Diagnosis Date Noted   DLBCL (diffuse large B cell lymphoma) ([place]) 04/15/2021   Bilateral inguinal hernia 10/13/2011    Past Surgical History:  Procedure Laterality Date   COLONOSCOPY     HERNIA REPAIR  10/13/11   lap BIH rep w/ mesh- Dr. Tora Kindred HERNIA REPAIR  10/13/2011   Procedure: LAPAROSCOPIC INGUINAL HERNIA;  Surgeon: Adin Hector, MD;  Location: [facility];  Service: General;  Laterality: Bilateral;  Laparoscopic bilateral inguinal hernia repair, converted to open bilateral inguinal hernia repairs with mesh   IR IMAGING GUIDED PORT INSERTION  05/06/2021   NASAL ENDOSCOPY Left 04/08/2021   Procedure: NASAL ENDOSCOPY WITH BIOPSY OF LEFT  NASOPPHARYNGEAL MASS;  Surgeon:  Coop, DO;  Location: [facility];  Service: ENT;  Laterality: Left;  Frozen Section     OB History   No obstetric history on file.     Family History  Problem Relation Age of Onset   Heart disease Mother     Social History   Tobacco Use   Smoking status: Never   Smokeless tobacco: Never  Vaping Use   Vaping Use: Never used  Substance Use Topics   Alcohol use: No   Drug use: No    Home Medications Prior to Admission medications   Medication Sig Start Date End Date Taking? Authorizing Provider  acetaminophen (TYLENOL) 500 MG tablet Take 1,000 mg by mouth every 6 (six) hours as needed for mild pain or fever.    [provider]  allopurinol (ZYLOPRIM) 300 MG tablet Take 1 tablet (300 mg total) by mouth daily. 04/29/21   Benay Pike, MD  amLODipine (NORVASC) 10 MG tablet Take 10 mg by mouth daily.    [provider]  atorvastatin (LIPITOR) 80 MG tablet Take 80 mg by mouth every morning.    [provider]  ezetimibe (ZETIA) 10 MG tablet Take 10 mg by mouth every morning.    [provider]  triamterene-hydrochlorothiazide (MAXZIDE-25) 37.5-25 MG tablet Take 1 tablet by mouth every morning.  [provider]    Allergies    Patient has no known allergies.  Review of Systems   Review of Systems  Respiratory:  Positive for shortness of breath.   All other systems reviewed and are negative.  Physical Exam Updated Vital Signs BP 107/65 (BP Location: Left Arm)   Pulse 84   Temp 98.8 F (37.1 C) (Oral)   Resp 15   Ht 4\' 11"  (1.499 m)   Wt 44 kg   SpO2 99%   BMI 19.59 kg/m   Physical Exam Vitals and nursing note reviewed.  Constitutional:      Appearance: She is well-developed.  HENT:     Head: Normocephalic and atraumatic.     Mouth/Throat:     Mouth: Mucous membranes are moist.     Pharynx: Oropharynx is clear.  Eyes:     Pupils: Pupils are equal, round, and reactive to  light.  Cardiovascular:     Rate and Rhythm: Normal rate and regular rhythm.  Pulmonary:     Effort: No respiratory distress.     Breath sounds: No stridor.  Abdominal:     General: There is no distension.  Musculoskeletal:     Cervical back: Normal range of motion.     Right lower leg: Edema present.     Left lower leg: Edema present.  Skin:    General: Skin is warm and dry.  Neurological:     General: No focal deficit present.     Mental Status: She is alert.    ED Results / Procedures / Treatments   Labs (all labs ordered are listed, but only abnormal results are displayed) Labs Reviewed  BASIC METABOLIC PANEL - Abnormal; Notable for the following components:      Result Value   Glucose, Bld 103 (*)    Creatinine, Ser 0.40 (*)    All other components within normal limits  CBC - Abnormal; Notable for the following components:   RBC 3.64 (*)    Hemoglobin 8.0 (*)    HCT 25.4 (*)    MCV 69.8 (*)    MCH 22.0 (*)    RDW 18.2 (*)    All other components within normal limits  HEPATIC FUNCTION PANEL - Abnormal; Notable for the following components:   Albumin 3.4 (*)    All other components within normal limits  BRAIN NATRIURETIC PEPTIDE  TROPONIN I (HIGH SENSITIVITY)  TROPONIN I (HIGH SENSITIVITY)    EKG None  Radiology DG Chest 2 View  Result Date: 05/14/2021 CLINICAL DATA:  Chest pain and shortness of breath. EXAM: CHEST - 2 VIEW COMPARISON:  Head CT 04/28/2021 FINDINGS: Right chest port in place. Normal heart size. Aortic atherosclerosis and tortuosity. Linear atelectasis in the left lower lobe with trace left pleural effusion. No confluent airspace disease. No pulmonary edema. No pneumothorax. The known hypermetabolic bone lesions on recent PET are not well-defined by radiograph. IMPRESSION: Linear atelectasis in the left lower lobe with trace left pleural effusion. Electronically Signed   By: Keith Rake M.D.   On: 05/14/2021 22:53   CT ABDOMEN PELVIS W  CONTRAST  Result Date: 05/15/2021 CLINICAL DATA:  Abdominal abscess EXAM: CT ABDOMEN AND PELVIS WITH CONTRAST TECHNIQUE: Multidetector CT imaging of the abdomen and pelvis was performed using the standard protocol following bolus administration of intravenous contrast. CONTRAST:  82mL OMNIPAQUE IOHEXOL 300 MG/ML  SOLN COMPARISON:  PET CT 04/28/2021 FINDINGS: Lower chest: The visualized lung bases are clear bilaterally. Extensive multi-vessel coronary artery  calcification. Global cardiac size within normal limits. Hepatobiliary: There are 3 hypoechoic masses identified within the liver corresponding to the hypermetabolic lesion seen on prior PET CT examination measuring up to 4.4 x 4.9 cm, unchanged from prior examination. The third lesion identified within the central aspect of segment 4 B is better visualized on the current exam. There are, additionally, at least 3 additional indeterminate subcentimeter hypoechoic masses identified within the right hepatic lobe which may represent additional foci of metastatic disease, but are indeterminate. No intra or extrahepatic biliary ductal dilation. Gallbladder unremarkable. Pancreas: Unremarkable Spleen: Unremarkable Adrenals/Urinary Tract: The adrenal glands are unremarkable. The kidneys are normal in size and position. There is mild left hydronephrosis and dilation of the left renal pelvis with decompression of the left ureter in keeping with a probable mild left UPJ obstruction. No intrarenal or ureteral calculi. No enhancing intrarenal masses. No hydronephrosis on the right. The bladder is mildly distended, but is otherwise unremarkable. Stomach/Bowel: The stomach, large bowel, and small bowel are unremarkable. No evidence of obstruction or focal inflammation. No free intraperitoneal gas or fluid. Vascular/Lymphatic: There is extensive aortoiliac atherosclerotic calcification. No aortic aneurysm. No pathologic adenopathy within the abdomen and pelvis. Reproductive:  Uterus and bilateral adnexa are unremarkable. Other: Mild diffuse subcutaneous body wall edema is in keeping with mild anasarca. No abdominal wall hernia. Musculoskeletal: Multiple lytic lesions are identified within the visualized axial skeleton, better visualized on prior PET CT examination, with the largest seen within the right iliac spine measuring 3.2 x 2.8 cm at axial image # 38/2. IMPRESSION: Finding similar to prior PET CT examination with multiple hepatic masses and multiple osseous lytic lesions compatible with metastatic foci of unknown primary. As noted previously, this would be an unusual presentation for lymphoma and percutaneous sampling should be considered for further evaluation. Lytic lesion within the iliac spine is largely comprised of soft tissue and multiple hepatic lesions are relatively superficial and these lesions should be amenable to CT-guided or ultrasound-guided biopsy, respectively, for further evaluation. Aortic Atherosclerosis (ICD10-I70.0). Electronically Signed   By: Fidela Salisbury MD   On: 05/15/2021 02:01   CT BIOPSY  Result Date: 05/14/2021 INDICATION: Recent diagnosis of lymphoma, now with multiple hypermetabolic osseous lesions. Please CT-guided biopsy tissue diagnostic. EXAM: CT-GUIDED BIOPSY OF LYTIC LESION INVOLVING THE ANTERIOR ASPECT MEDICATIONS: None ANESTHESIA/SEDATION: Fentanyl 50 mcg IV; Versed 1 mg IV Sedation Time: 18 Minutes; The patient was continuously monitored during the procedure by the interventional radiology nurse under my direct supervision. COMPLICATIONS: None immediate. PROCEDURE: Informed consent was obtained from the patient following an explanation of the procedure, risks, benefits and alternatives. The patient understands, agrees and consents for the procedure. All questions were addressed. A time out was performed prior to the initiation of the procedure. The patient was positioned supine and non-contrast localization CT was performed of the  pelvis demonstrating unchanged size appearance of the approximately 1.8 x 1.6 cm lytic lesion involving anterior aspect of the left ilium (image 7 series 2). The operative site was prepped and draped in the usual sterile fashion. Under sterile conditions and local anesthesia, a 22 gauge spinal needle was utilized for procedural planning. Next, an 11 gauge coaxial bone biopsy needle was advanced into the lytic lesion involving the anterior aspect left ilium. Appropriate position was confirmed with CT imaging. Initially, a bone biopsy was obtained with inner 13 gauge bone needle biopsy device (image 8, series 7) followed by additional bone biopsy with the outer 11 gauge coaxial bone biopsy device (  image 8, series 8). The identical procedure was repeated yielding the acquisition of two additional core needle biopsy samples (images 10, series 13 and image 10, series 14). The needle was removed and superficial hemostasis was obtained with manual compression. A dressing was applied. The patient tolerated the procedure well without immediate post procedural complication. IMPRESSION: Successful CT guided biopsy of lytic lesion involving the anterior aspect of the left ilium. Electronically Signed   By: Sandi Mariscal M.D.   On: 05/14/2021 11:52    Procedures Procedures   Medications Ordered in ED Medications  iohexol (OMNIPAQUE) 9 MG/ML oral solution 500 mL (500 mLs Oral Contrast Given 05/14/21 2324)  iohexol (OMNIPAQUE) 300 MG/ML solution 75 mL (60 mLs Intravenous Contrast Given 05/15/21 0115)    ED Course  I have reviewed the triage vital signs and the nursing notes.  Pertinent labs & imaging results that were available during my care of the patient were reviewed by me and considered in my medical decision making ([name] chart for details).    MDM Rules/Calculators/A&P                          Lungs are clear.  Vital signs are normal.  Your labs did not show any evidence of heart failure or renal failure.  She  does have a liver lesion and multiple other metastatic lesions of unknown origin.  We will get a CT scan to make sure there is any obstructive reasons for her lower extremity swelling however is probably related to stopping her hydrochlorothiazide.  We will go and advise her to stop her Norvasc as well for now.  Suggest compression stockings if her CT scan is negative. CT ok. Will fu w/ PCP for same.   Final Clinical Impression(s) / ED Diagnoses Final diagnoses:  Peripheral edema    Rx / DC Orders ED Discharge Orders     None        Gittel Mccamish, Corene Cornea, MD 05/15/21 [phone]

## 2021-05-15 ENCOUNTER — Encounter (HOSPITAL_BASED_OUTPATIENT_CLINIC_OR_DEPARTMENT_OTHER): Payer: Self-pay | Admitting: Radiology

## 2021-05-15 ENCOUNTER — Emergency Department (HOSPITAL_BASED_OUTPATIENT_CLINIC_OR_DEPARTMENT_OTHER): Payer: Medicare HMO

## 2021-05-15 DIAGNOSIS — L02211 Cutaneous abscess of abdominal wall: Secondary | ICD-10-CM | POA: Diagnosis not present

## 2021-05-15 LAB — HEPATIC FUNCTION PANEL
ALT: 22 U/L (ref 0–44)
AST: 28 U/L (ref 15–41)
Albumin: 3.4 g/dL — ABNORMAL LOW (ref 3.5–5.0)
Alkaline Phosphatase: 119 U/L (ref 38–126)
Bilirubin, Direct: 0.1 mg/dL (ref 0.0–0.2)
Indirect Bilirubin: 0.4 mg/dL (ref 0.3–0.9)
Total Bilirubin: 0.5 mg/dL (ref 0.3–1.2)
Total Protein: 6.5 g/dL (ref 6.5–8.1)

## 2021-05-15 LAB — TROPONIN I (HIGH SENSITIVITY): Troponin I (High Sensitivity): 6 ng/L (ref <18)

## 2021-05-15 MED ORDER — IOHEXOL 300 MG/ML  SOLN
75.0000 mL | Freq: Once | INTRAMUSCULAR | Status: AC | PRN
  Administered 2021-05-15: 60 mL via INTRAVENOUS

## 2021-05-18 LAB — SURGICAL PATHOLOGY

## 2021-05-27 ENCOUNTER — Other Ambulatory Visit: Payer: Self-pay

## 2021-05-27 ENCOUNTER — Encounter: Payer: Self-pay | Admitting: Hematology and Oncology

## 2021-05-27 ENCOUNTER — Inpatient Hospital Stay: Payer: Medicare HMO | Attending: Hematology and Oncology | Admitting: Hematology and Oncology

## 2021-05-27 ENCOUNTER — Inpatient Hospital Stay: Payer: Medicare HMO

## 2021-05-27 VITALS — BP 130/69 | HR 98 | Temp 98.0°F | Resp 17 | Ht 59.0 in | Wt 106.3 lb

## 2021-05-27 DIAGNOSIS — C8331 Diffuse large B-cell lymphoma, lymph nodes of head, face, and neck: Secondary | ICD-10-CM | POA: Diagnosis not present

## 2021-05-27 DIAGNOSIS — I700 Atherosclerosis of aorta: Secondary | ICD-10-CM | POA: Diagnosis not present

## 2021-05-27 DIAGNOSIS — R5383 Other fatigue: Secondary | ICD-10-CM | POA: Insufficient documentation

## 2021-05-27 DIAGNOSIS — Z8249 Family history of ischemic heart disease and other diseases of the circulatory system: Secondary | ICD-10-CM | POA: Insufficient documentation

## 2021-05-27 DIAGNOSIS — C7951 Secondary malignant neoplasm of bone: Secondary | ICD-10-CM | POA: Insufficient documentation

## 2021-05-27 DIAGNOSIS — Z79899 Other long term (current) drug therapy: Secondary | ICD-10-CM | POA: Diagnosis not present

## 2021-05-27 DIAGNOSIS — Z5112 Encounter for antineoplastic immunotherapy: Secondary | ICD-10-CM | POA: Diagnosis not present

## 2021-05-27 DIAGNOSIS — C8330 Diffuse large B-cell lymphoma, unspecified site: Secondary | ICD-10-CM

## 2021-05-27 DIAGNOSIS — M7989 Other specified soft tissue disorders: Secondary | ICD-10-CM | POA: Diagnosis not present

## 2021-05-27 DIAGNOSIS — C787 Secondary malignant neoplasm of liver and intrahepatic bile duct: Secondary | ICD-10-CM | POA: Diagnosis not present

## 2021-05-27 DIAGNOSIS — Z5189 Encounter for other specified aftercare: Secondary | ICD-10-CM | POA: Diagnosis not present

## 2021-05-27 DIAGNOSIS — Z5111 Encounter for antineoplastic chemotherapy: Secondary | ICD-10-CM | POA: Insufficient documentation

## 2021-05-27 LAB — CBC WITH DIFFERENTIAL/PLATELET
Abs Immature Granulocytes: 0.01 10*3/uL (ref 0.00–0.07)
Basophils Absolute: 0 10*3/uL (ref 0.0–0.1)
Basophils Relative: 0 %
Eosinophils Absolute: 0 10*3/uL (ref 0.0–0.5)
Eosinophils Relative: 0 %
HCT: 27.2 % — ABNORMAL LOW (ref 36.0–46.0)
Hemoglobin: 8.7 g/dL — ABNORMAL LOW (ref 12.0–15.0)
Immature Granulocytes: 0 %
Lymphocytes Relative: 26 %
Lymphs Abs: 2 10*3/uL (ref 0.7–4.0)
MCH: 22.3 pg — ABNORMAL LOW (ref 26.0–34.0)
MCHC: 32 g/dL (ref 30.0–36.0)
MCV: 69.7 fL — ABNORMAL LOW (ref 80.0–100.0)
Monocytes Absolute: 0.6 10*3/uL (ref 0.1–1.0)
Monocytes Relative: 8 %
Neutro Abs: 4.9 10*3/uL (ref 1.7–7.7)
Neutrophils Relative %: 66 %
Platelets: 324 10*3/uL (ref 150–400)
RBC: 3.9 MIL/uL (ref 3.87–5.11)
RDW: 18.9 % — ABNORMAL HIGH (ref 11.5–15.5)
WBC: 7.5 10*3/uL (ref 4.0–10.5)
nRBC: 0 % (ref 0.0–0.2)

## 2021-05-27 LAB — CMP (CANCER CENTER ONLY)
ALT: 17 U/L (ref 0–44)
AST: 31 U/L (ref 15–41)
Albumin: 3.1 g/dL — ABNORMAL LOW (ref 3.5–5.0)
Alkaline Phosphatase: 145 U/L — ABNORMAL HIGH (ref 38–126)
Anion gap: 11 (ref 5–15)
BUN: 13 mg/dL (ref 8–23)
CO2: 29 mmol/L (ref 22–32)
Calcium: 9.6 mg/dL (ref 8.9–10.3)
Chloride: 101 mmol/L (ref 98–111)
Creatinine: 0.7 mg/dL (ref 0.44–1.00)
GFR, Estimated: >60 mL/min (ref >60)
Glucose, Bld: 123 mg/dL — ABNORMAL HIGH (ref 70–99)
Potassium: 3.5 mmol/L (ref 3.5–5.1)
Sodium: 141 mmol/L (ref 135–145)
Total Bilirubin: 0.5 mg/dL (ref 0.3–1.2)
Total Protein: 7.8 g/dL (ref 6.5–8.1)

## 2021-05-27 LAB — MAGNESIUM: Magnesium: 1.9 mg/dL (ref 1.7–2.4)

## 2021-05-27 LAB — URIC ACID: Uric Acid, Serum: 3.2 mg/dL (ref 2.5–7.1)

## 2021-05-27 LAB — LACTATE DEHYDROGENASE: LDH: 673 U/L — ABNORMAL HIGH (ref 98–192)

## 2021-05-27 LAB — PHOSPHORUS: Phosphorus: 3.4 mg/dL (ref 2.5–4.6)

## 2021-05-27 NOTE — Progress Notes (Signed)
[facility] NOTE  Patient Care Team: Antony Contras, MD as PCP - General (Family Medicine)  CHIEF COMPLAINTS/PURPOSE OF CONSULTATION:  DLBCL of nasopharynx.  ASSESSMENT & PLAN:  GCB subtype, DLBCL  No problem-specific Assessment & Plan notes found for this encounter.  Orders Placed This Encounter  Procedures   CBC with Differential/Platelet    Standing Status:   Standing    Number of Occurrences:   22    Standing Expiration Date:   05/27/2022   CMP ([facility] only)    Standing Status:   Future    Number of Occurrences:   1    Standing Expiration Date:   05/27/2022   HIV 1 RNA quant-no reflex-blood    Standing Status:   Future    Number of Occurrences:   1    Standing Expiration Date:   05/27/2022   Lactate dehydrogenase    Standing Status:   Future    Number of Occurrences:   1    Standing Expiration Date:   05/27/2022   Uric acid    Standing Status:   Future    Number of Occurrences:   1    Standing Expiration Date:   05/27/2022   Phosphorus    Standing Status:   Future    Number of Occurrences:   1    Standing Expiration Date:   05/27/2022   Magnesium    Standing Status:   Future    Number of Occurrences:   1    Standing Expiration Date:   05/27/2022    This is a pleasant 87 year old female patient with new diagnosis of diffuse large B-cell lymphoma of nasopharynx, 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  She had a second biopsy given the unusual pattern of spread from the left ilium which showed findings consistent with a diffuse large B-cell lymphoma, similar process as a prior biopsy.  I do not see any FISH testing for diffuse large B-cell lymphoma to define it as double or triple hit. Baseline echo with ejection fraction of 68%.  She did have a port placed in anticipation of chemotherapy. Physical examination, no palpable lymphadenopathy or hepatosplenomegaly.  No baseline tumor lysis,  started on allopurinol prophylaxis.  Given her age and performance status, I would lean towards using R mini CHOP as treatment for diffuse large B-cell lymphoma.  R mini CHOP basically consists of rituximab 375 mg per metered square, cyclophosphamide at 400 mg per metered square, doxorubicin of 25 mg per metered square, vincristine 1 mg 1 day 1 of each cycle and 40 mg of prednisone per metered square on day 1 to day 5. She does have high CNS IPI score of 5, hence high risk for CNS recurrence however remains skeptical if she can tolerate methotrexate right now.  We will try to proceed with induction first and if she continues to do very well on chemotherapy, can plan to do intrathecal methotrexate at the end of the treatment. Chemotherapy education will be scheduled.  Anticipate start of cycle 1 day 1 of R mini CHOP next week.  She should return to clinic for follow-up before cycle 1 day 1 and every 21 days.  Anticipate repeating imaging in about 2-3 cycles to assess response. I will also discussed with the pathologist if we can send a [place] testing on the lymphoma specimen to better characterize the presence of double hit or triple hit which may not necessarily change  her treatment in this case but may help [place] prognosticate better. She and her family are in agreement with this plan.  If she cannot tolerate chemotherapy very well, we can think of and on chemotherapy backbone or palliative care. Labs today.  HISTORY OF PRESENTING ILLNESS:   Toba I Wrightson 87 y.o. female is here because of new diagnosis of DLBCL of nasopharynx.  Ms. Gatchel is a very pleasant 87 year old female patient who arrived today with her granddaughter Ms. Amber to the appointment.  She does not quite remember things as explained and has short-term memory loss.  After a bit of the conversation, she did admit to me that she was told about a cancer in her nose which was biopsied last week but she cannot remember the details.  [name]  mentioned that for the past year or so grandma has been having more nasal drainage and has progressively lost weight in the past couple months of at least 20 pounds, has become relatively more sedentary and inactive, has no appetite.  They initially thought she might be having a stroke when she was not getting out of bed for 3days and has not been eating.  When she was in the ER.  She had some imaging CT head without contrast which showed left maxillary and ethmoid sinus disease. She then had MRI brain on the same day which showed 3.2 x 2 x 3.5 cm heterogeneous mass positioned at the left nasopharynx near the fossa of [place].  Finding is most concerning for a primary nasopharyngeal neoplasm.  ENT consultation was recommended.  She then went on to see [name].Marland Kitchen  She had nasal endoscopy with biopsy of left nasopharyngeal mass.  According to Dr. Roseanna Rainbow, polypoid mass lesion was noted to be lymphoma from the posterior aspect of the left middle turbinate.  Pathology showed findings consistent with diffuse large B-cell lymphoma of the left nasopharyngeal mass as well as left middle turbinate.   PET/CT showed hypermetabolic left nasopharyngeal mass, no associated neck adenopathy, 2 large hypermetabolic liver lesions, destructive lytic metastatic bone lesions.  Since there was no lymphadenopathy and no unusual appearance of spleen, second biopsy was recommended to confirm the diagnosis.  Left ilium  Biopsy showed findings consistent with involvement by non-Hodgkin B-cell lymphoma.  Features are similar to previously known large B-cell lymphoma consistent with involvement by the same disease process.  Interval History  She is here for consideration of treatment with her grand daughter. She is doing well, appetite is low, lost a little bit of weight No falls in the interim NO other B symptoms No pain, feels fullness in nose. No change in breathing/bowel or urinary habits.  MEDICAL HISTORY:   Past Medical History:  Diagnosis Date   Anemia    Arthritis    knees    GERD (gastroesophageal reflux disease)    Hiatal hernia    History of inguinal hernia repair    BIH   Hyperlipidemia    Hypertension    Memory problem     SURGICAL HISTORY: Past Surgical History:  Procedure Laterality Date   COLONOSCOPY     HERNIA REPAIR  10/13/11   lap BIH rep w/ mesh- Dr. Tora Kindred HERNIA REPAIR  10/13/2011   Procedure: LAPAROSCOPIC INGUINAL HERNIA;  Surgeon: Adin Hector, MD;  Location: [facility];  Service: General;  Laterality: Bilateral;  Laparoscopic bilateral inguinal hernia repair, converted to open bilateral inguinal hernia repairs with mesh   IR IMAGING GUIDED PORT  INSERTION  05/06/2021   NASAL ENDOSCOPY Left 04/08/2021   Procedure: NASAL ENDOSCOPY WITH BIOPSY OF LEFT NASOPPHARYNGEAL MASS;  Surgeon: Jason Coop, DO;  Location: [facility];  Service: ENT;  Laterality: Left;  Frozen Section    SOCIAL HISTORY: Social History   Socioeconomic History   Marital status: Single    Spouse name: Not on file   Number of children: Not on file   Years of education: Not on file   Highest education level: Not on file  Occupational History   Not on file  Tobacco Use   Smoking status: Never   Smokeless tobacco: Never  Vaping Use   Vaping Use: Never used  Substance and Sexual Activity   Alcohol use: No   Drug use: No   Sexual activity: Not on file  Other Topics Concern   Not on file  Social History Narrative   Not on file   Social Determinants of Health   Financial Resource Strain: Not on file  Food Insecurity: Not on file  Transportation Needs: Not on file  Physical Activity: Not on file  Stress: Not on file  Social Connections: Not on file  Intimate Partner Violence: Not on file    FAMILY HISTORY: Family History  Problem Relation Age of Onset   Heart disease Mother     ALLERGIES:  has No Known Allergies.  MEDICATIONS:  Current Outpatient Medications   Medication Sig Dispense Refill   acetaminophen (TYLENOL) 500 MG tablet Take 1,000 mg by mouth every 6 (six) hours as needed for mild pain or fever.     allopurinol (ZYLOPRIM) 300 MG tablet Take 1 tablet (300 mg total) by mouth daily. 30 tablet 0   amLODipine (NORVASC) 10 MG tablet Take 10 mg by mouth daily.     atorvastatin (LIPITOR) 80 MG tablet Take 80 mg by mouth every morning.     ezetimibe (ZETIA) 10 MG tablet Take 10 mg by mouth every morning.     triamterene-hydrochlorothiazide (MAXZIDE-25) 37.5-25 MG tablet Take 1 tablet by mouth every morning.     No current facility-administered medications for this visit.     PHYSICAL EXAMINATION:  ECOG PERFORMANCE STATUS: 1 - Symptomatic but completely ambulatory  Vitals:   05/27/21 1409  BP: 130/69  Pulse: 98  Resp: 17  Temp: 98 F (36.7 C)  SpO2: 95%    Filed Weights   05/27/21 1409  Weight: 106 lb 4.8 oz (48.2 kg)    Physical Exam Constitutional:      Appearance: Normal appearance.  HENT:     Head: Normocephalic and atraumatic.  Eyes:     Pupils: Pupils are equal, round, and reactive to light.  Cardiovascular:     Rate and Rhythm: Normal rate and regular rhythm.     Pulses: Normal pulses.     Heart sounds: Normal heart sounds.  Pulmonary:     Effort: Pulmonary effort is normal.     Breath sounds: Normal breath sounds.  Abdominal:     General: Abdomen is flat. Bowel sounds are normal.     Palpations: Abdomen is soft.  Musculoskeletal:        General: Swelling present. No tenderness.     Cervical back: Normal range of motion and neck supple. No rigidity.  Lymphadenopathy:     Cervical: No cervical adenopathy.  Skin:    General: Skin is warm and dry.  Neurological:     General: No focal deficit present.     Mental Status: She is  alert.  Psychiatric:        Mood and Affect: Mood normal.     LABORATORY DATA:  I have reviewed the data as listed Lab Results  Component Value Date   WBC 8.5 05/14/2021   HGB  8.0 (L) 05/14/2021   HCT 25.4 (L) 05/14/2021   MCV 69.8 (L) 05/14/2021   PLT 241 05/14/2021     Chemistry      Component Value Date/Time   NA 141 05/14/2021 2216   K 3.5 05/14/2021 2216   CL 103 05/14/2021 2216   CO2 27 05/14/2021 2216   BUN 12 05/14/2021 2216   CREATININE 0.40 (L) 05/14/2021 2216   CREATININE 0.69 04/29/2021 0954      Component Value Date/Time   CALCIUM 9.2 05/14/2021 2216   ALKPHOS 119 05/15/2021 0012   AST 28 05/15/2021 0012   AST 30 04/29/2021 0954   ALT 22 05/15/2021 0012   ALT 21 04/29/2021 0954   BILITOT 0.5 05/15/2021 0012   BILITOT 0.5 04/29/2021 0954       RADIOGRAPHIC STUDIES: I have personally reviewed the radiological images as listed and agreed with the findings in the report. DG Chest 2 View  Result Date: 05/14/2021 CLINICAL DATA:  Chest pain and shortness of breath. EXAM: CHEST - 2 VIEW COMPARISON:  Head CT 04/28/2021 FINDINGS: Right chest port in place. Normal heart size. Aortic atherosclerosis and tortuosity. Linear atelectasis in the left lower lobe with trace left pleural effusion. No confluent airspace disease. No pulmonary edema. No pneumothorax. The known hypermetabolic bone lesions on recent PET are not well-defined by radiograph. IMPRESSION: Linear atelectasis in the left lower lobe with trace left pleural effusion. Electronically Signed   By: Keith Rake M.D.   On: 05/14/2021 22:53   CT ABDOMEN PELVIS W CONTRAST  Result Date: 05/15/2021 CLINICAL DATA:  Abdominal abscess EXAM: CT ABDOMEN AND PELVIS WITH CONTRAST TECHNIQUE: Multidetector CT imaging of the abdomen and pelvis was performed using the standard protocol following bolus administration of intravenous contrast. CONTRAST:  56mL OMNIPAQUE IOHEXOL 300 MG/ML  SOLN COMPARISON:  PET CT 04/28/2021 FINDINGS: Lower chest: The visualized lung bases are clear bilaterally. Extensive multi-vessel coronary artery calcification. Global cardiac size within normal limits. Hepatobiliary: There  are 3 hypoechoic masses identified within the liver corresponding to the hypermetabolic lesion seen on prior PET CT examination measuring up to 4.4 x 4.9 cm, unchanged from prior examination. The third lesion identified within the central aspect of segment 4 B is better visualized on the current exam. There are, additionally, at least 3 additional indeterminate subcentimeter hypoechoic masses identified within the right hepatic lobe which may represent additional foci of metastatic disease, but are indeterminate. No intra or extrahepatic biliary ductal dilation. Gallbladder unremarkable. Pancreas: Unremarkable Spleen: Unremarkable Adrenals/Urinary Tract: The adrenal glands are unremarkable. The kidneys are normal in size and position. There is mild left hydronephrosis and dilation of the left renal pelvis with decompression of the left ureter in keeping with a probable mild left UPJ obstruction. No intrarenal or ureteral calculi. No enhancing intrarenal masses. No hydronephrosis on the right. The bladder is mildly distended, but is otherwise unremarkable. Stomach/Bowel: The stomach, large bowel, and small bowel are unremarkable. No evidence of obstruction or focal inflammation. No free intraperitoneal gas or fluid. Vascular/Lymphatic: There is extensive aortoiliac atherosclerotic calcification. No aortic aneurysm. No pathologic adenopathy within the abdomen and pelvis. Reproductive: Uterus and bilateral adnexa are unremarkable. Other: Mild diffuse subcutaneous body wall edema is in keeping with mild anasarca.  No abdominal wall hernia. Musculoskeletal: Multiple lytic lesions are identified within the visualized axial skeleton, better visualized on prior PET CT examination, with the largest seen within the right iliac spine measuring 3.2 x 2.8 cm at axial image # 38/2. IMPRESSION: Finding similar to prior PET CT examination with multiple hepatic masses and multiple osseous lytic lesions compatible with metastatic foci  of unknown primary. As noted previously, this would be an unusual presentation for lymphoma and percutaneous sampling should be considered for further evaluation. Lytic lesion within the iliac spine is largely comprised of soft tissue and multiple hepatic lesions are relatively superficial and these lesions should be amenable to CT-guided or ultrasound-guided biopsy, respectively, for further evaluation. Aortic Atherosclerosis (ICD10-I70.0). Electronically Signed   By: Fidela Salisbury MD   On: 05/15/2021 02:01   NM PET Image Initial (PI) Skull Base To Thigh  Result Date: 04/28/2021 CLINICAL DATA:  Initial treatment strategy for diffuse large B-cell lymphoma on recent biopsy of the nasopharynx. EXAM: NUCLEAR MEDICINE PET SKULL BASE TO THIGH TECHNIQUE: 4.96 mCi F-18 FDG was injected intravenously. Full-ring PET imaging was performed from the skull base to thigh after the radiotracer. CT data was obtained and used for attenuation correction and anatomic localization. Fasting blood glucose: 88 mg/dl COMPARISON:  Brain MRI 03/22/2021 FINDINGS: Mediastinal blood pool activity: SUV max 1.82 Liver activity: SUV max 2.17 NECK: The large left-sided nasopharyngeal mass is hypermetabolic with SUV max of 53.61. I do not however see any enlarged or hypermetabolic neck lymph nodes. Incidental CT findings: Multinodular thyroid goiter noted but no hypermetabolic foci. Bilateral carotid artery calcifications. Opacification of the left maxillary sinus. CHEST: Few small axillary lymph nodes bilateral are weakly hypermetabolic SUV max of 4.43. These are likely reactive. No breast masses are identified. No enlarged or hypermetabolic mediastinal or hilar lymph nodes. No worrisome pulmonary lesions or pulmonary nodules to suggest metastatic disease. Incidental CT findings: Advanced atherosclerotic calcifications involving the aorta and coronary arteries. ABDOMEN/PELVIS: There are 2 hypermetabolic right hepatic lobe lesions the more  superior lesion measures 3.6 cm and has SUV max of 6.30. The slightly more inferior and posterior lesion measures 3.4 cm and the SUV max is 8.18 No adrenal gland lesions. No hypermetabolic abdominal or pelvic lymphadenopathy. The spleen is normal in size. No hypermetabolism. Incidental CT findings: Advanced atherosclerotic calcifications involving the aorta iliac arteries. No focal aneurysm. Calcified uterine fibroids are noted. SKELETON: Diffuse osseous metastatic disease. This involves the axial and appendicular skeleton. There are bilateral humeral lesions, right clavicle lesions, right scapular lesion, left sided sternal manubrial lesion, rib lesions, bony pelvic lesions and bilateral hip lesions. These are lytic destructive bone lesions. Index lesions: Left sternal lesion measures 17 mm and SUV max is 7.65. Upper right sacral lesion measures 2.7 cm and SUV max is 7.69 Right hip lesion at the level of the lesser trochanter has an SUV max of 10.60. Left subtrochanteric femoral lesion has an SUV max of 8.03 Incidental CT findings: none IMPRESSION: 1. Hypermetabolic left nasopharyngeal mass consistent with known biopsy proven lymphoma. No associated neck adenopathy. 2. Two large hypermetabolic liver lesions have the appearance of metastatic disease. 3. Diffuse destructive lytic metastatic bone lesions involving the axial and appendicular skeleton as detailed above. 4. Normal appearance of the spleen and no adenopathy involving the chest, abdomen or pelvis. 5. Somewhat unusual presentation for lymphoma. Possible second primary? No obvious findings for breast cancer or lung cancer or colon cancer. Possible consideration for liver or bone biopsy. Electronically Signed  By: Marijo Sanes M.D.   On: 04/28/2021 12:20   CT BIOPSY  Result Date: 05/14/2021 INDICATION: Recent diagnosis of lymphoma, now with multiple hypermetabolic osseous lesions. Please CT-guided biopsy tissue diagnostic. EXAM: CT-GUIDED BIOPSY OF  LYTIC LESION INVOLVING THE ANTERIOR ASPECT MEDICATIONS: None ANESTHESIA/SEDATION: Fentanyl 50 mcg IV; Versed 1 mg IV Sedation Time: 18 Minutes; The patient was continuously monitored during the procedure by the interventional radiology nurse under my direct supervision. COMPLICATIONS: None immediate. PROCEDURE: Informed consent was obtained from the patient following an explanation of the procedure, risks, benefits and alternatives. The patient understands, agrees and consents for the procedure. All questions were addressed. A time out was performed prior to the initiation of the procedure. The patient was positioned supine and non-contrast localization CT was performed of the pelvis demonstrating unchanged size appearance of the approximately 1.8 x 1.6 cm lytic lesion involving anterior aspect of the left ilium (image 7 series 2). The operative site was prepped and draped in the usual sterile fashion. Under sterile conditions and local anesthesia, a 22 gauge spinal needle was utilized for procedural planning. Next, an 11 gauge coaxial bone biopsy needle was advanced into the lytic lesion involving the anterior aspect left ilium. Appropriate position was confirmed with CT imaging. Initially, a bone biopsy was obtained with inner 13 gauge bone needle biopsy device (image 8, series 7) followed by additional bone biopsy with the outer 11 gauge coaxial bone biopsy device (image 8, series 8). The identical procedure was repeated yielding the acquisition of two additional core needle biopsy samples (images 10, series 13 and image 10, series 14). The needle was removed and superficial hemostasis was obtained with manual compression. A dressing was applied. The patient tolerated the procedure well without immediate post procedural complication. IMPRESSION: Successful CT guided biopsy of lytic lesion involving the anterior aspect of the left ilium. Electronically Signed   By: Sandi Mariscal M.D.   On: 05/14/2021 11:52    ECHOCARDIOGRAM COMPLETE  Result Date: 05/01/2021    ECHOCARDIOGRAM REPORT   Patient Name:   Tyasia I Lagan Date of Exam: 05/01/2021 Medical Rec #:  [id number]   Height:       59.0 in Accession #:    [id number]  Weight:       97.0 lb Date of Birth:  May 25, 1932   BSA:          1.356 m Patient Age:    89 years    BP:           105/63 mmHg Patient Gender: F           HR:           75 bpm. Exam Location:  Outpatient Procedure: 2D Echo, 3D Echo, Cardiac Doppler, Color Doppler and Strain Analysis Indications:    ; Z51.11 Encounter for antineoplastic chemotheraphy  History:        Patient has prior history of Echocardiogram examinations, most                 recent 12/18/2015. Risk Factors:Hypertension, Dyslipidemia and                 GERD. Anemia.  Sonographer:    Tiffany Dance Referring Phys: [id number] [place]  1. Left ventricular ejection fraction by 3D volume is 68 %. The left ventricle has normal function. The left ventricle demonstrates regional wall motion abnormalities (see scoring diagram/findings for description). There is mild left ventricular hypertrophy of the basal-septal segment. Left ventricular diastolic  parameters are consistent with Grade I diastolic dysfunction (impaired relaxation). The average left ventricular global longitudinal strain is -22.4 %. The global longitudinal strain is normal.  2. Right ventricular systolic function is normal. The right ventricular size is normal. There is normal pulmonary artery systolic pressure.  3. Left atrial size was moderately dilated.  4. The mitral valve is normal in structure. No evidence of mitral valve regurgitation. No evidence of mitral stenosis.  5. The aortic valve is normal in structure. Aortic valve regurgitation is not visualized. Mild aortic valve sclerosis is present, with no evidence of aortic valve stenosis.  6. The inferior vena cava is normal in size with greater than 50% respiratory variability, suggesting right atrial pressure  of 3 mmHg. FINDINGS  Left Ventricle: Left ventricular ejection fraction by 3D volume is 68 %. The left ventricle has normal function. The left ventricle demonstrates regional wall motion abnormalities. The average left ventricular global longitudinal strain is -22.4 %. The global longitudinal strain is normal. The left ventricular internal cavity size was normal in size. There is mild left ventricular hypertrophy of the basal-septal segment. Left ventricular diastolic parameters are consistent with Grade I diastolic dysfunction (impaired relaxation).  LV Wall Scoring: The basal inferior segment is akinetic. Right Ventricle: The right ventricular size is normal. No increase in right ventricular wall thickness. Right ventricular systolic function is normal. There is normal pulmonary artery systolic pressure. The tricuspid regurgitant velocity is 2.27 m/s, and  with an assumed right atrial pressure of 3 mmHg, the estimated right ventricular systolic pressure is 66.2 mmHg. Left Atrium: Left atrial size was moderately dilated. Right Atrium: Right atrial size was normal in size. Pericardium: There is no evidence of pericardial effusion. Mitral Valve: The mitral valve is normal in structure. No evidence of mitral valve regurgitation. No evidence of mitral valve stenosis. Tricuspid Valve: The tricuspid valve is normal in structure. Tricuspid valve regurgitation is not demonstrated. No evidence of tricuspid stenosis. Aortic Valve: The aortic valve is normal in structure. Aortic valve regurgitation is not visualized. Mild aortic valve sclerosis is present, with no evidence of aortic valve stenosis. Pulmonic Valve: The pulmonic valve was normal in structure. Pulmonic valve regurgitation is trivial. No evidence of pulmonic stenosis. Aorta: The aortic root is normal in size and structure. Venous: The inferior vena cava is normal in size with greater than 50% respiratory variability, suggesting right atrial pressure of 3 mmHg.  IAS/Shunts: No atrial level shunt detected by color flow Doppler.  LEFT VENTRICLE PLAX 2D LVIDd:         3.10 cm         Diastology LVIDs:         1.90 cm         LV e' medial:    4.90 cm/s LV PW:         1.10 cm         LV E/e' medial:  14.7 LV IVS:        1.10 cm         LV e' lateral:   5.55 cm/s LVOT diam:     1.90 cm         LV E/e' lateral: 13.0 LV SV:         56 LV SV Index:   41              2D LVOT Area:     2.84 cm        Longitudinal  Strain                                2D Strain GLS  -22.4 %                                Avg:                                 3D Volume EF                                LV 3D EF:    Left                                             ventricular                                             ejection                                             fraction by                                             3D volume                                             is 68 %.                                 3D Volume EF:                                3D EF:        68 %                                LV EDV:       77 ml                                LV ESV:       24 ml                                LV SV:        52 ml RIGHT VENTRICLE             IVC RV Basal diam:  2.60 cm     IVC diam:  1.20 cm RV S prime:     14.10 cm/s TAPSE (M-mode): 2.0 cm LEFT ATRIUM             Index       RIGHT ATRIUM           Index LA diam:        3.70 cm 2.73 cm/m  RA Area:     10.90 cm LA Vol (A2C):   59.7 ml 44.02 ml/m RA Volume:   23.30 ml  17.18 ml/m LA Vol (A4C):   57.6 ml 42.48 ml/m LA Biplane Vol: 60.4 ml 44.54 ml/m  AORTIC VALVE LVOT Vmax:   98.55 cm/s LVOT Vmean:  66.000 cm/s LVOT VTI:    0.198 m  AORTA Ao Root diam: 3.20 cm Ao Asc diam:  3.00 cm MITRAL VALVE               TRICUSPID VALVE MV Area (PHT): 3.27 cm    TR Peak grad:   20.6 mmHg MV Decel Time: 232 msec    TR Vmax:        227.00 cm/s MV E velocity: 72.20 cm/s MV A velocity: 98.50 cm/s  SHUNTS MV E/A ratio:   0.73        Systemic VTI:  0.20 m                            Systemic Diam: 1.90 cm Candee Furbish MD Electronically signed by Candee Furbish MD Signature Date/Time: 05/01/2021/12:21:26 PM    Final    IR IMAGING GUIDED PORT INSERTION  Result Date: 05/06/2021 INDICATION: 87 year old female with diffuse large B-cell lymphoma EXAM: IMPLANTED PORT A CATH PLACEMENT WITH ULTRASOUND AND FLUOROSCOPIC GUIDANCE MEDICATIONS: None ANESTHESIA/SEDATION: Versed 1 mg IV; Fentanyl 50 mcg IV; Moderate Sedation Time:  19 minutes The patient was continuously monitored during the procedure by the interventional radiology nurse under my direct supervision. FLUOROSCOPY TIME:  0 minutes, 30 seconds (2 mGy) COMPLICATIONS: None immediate. PROCEDURE: The right neck and chest was prepped with chlorhexidine, and draped in the usual sterile fashion using maximum barrier technique (cap and mask, sterile gown, sterile gloves, large sterile sheet, hand hygiene and cutaneous antiseptic). Local anesthesia was attained by infiltration with 1% lidocaine with epinephrine. Ultrasound demonstrated patency of the right internal jugular vein, and this was documented with an image. Under real-time ultrasound guidance, this vein was accessed with a 21 gauge micropuncture needle and image documentation was performed. A small dermatotomy was made at the access site with an 11 scalpel. A 0.018" wire was advanced into the SVC and the access needle exchanged for a 29F micropuncture vascular sheath. The 0.018" wire was then removed and a 0.035" wire advanced into the IVC. An appropriate location for the subcutaneous reservoir was selected below the clavicle and an incision was made through the skin and underlying soft tissues. The subcutaneous tissues were then dissected using a combination of blunt and sharp surgical technique and a pocket was formed. A single lumen low-profile power injectable portacatheter was then tunneled through the subcutaneous tissues from the  pocket to the dermatotomy and the port reservoir placed within the subcutaneous pocket. The venous access site was then serially dilated and a peel away vascular sheath placed over the wire. The wire was removed and the port catheter advanced into position under fluoroscopic guidance. The catheter tip is positioned in the superior cavoatrial junction. This was documented with a spot image. The portacatheter was  then tested and found to flush and aspirate well. The port was flushed with saline followed by 100 units/mL heparinized saline. The pocket was then closed in two layers using first subdermal inverted interrupted absorbable sutures followed by a running subcuticular suture. The epidermis was then sealed with Dermabond. The dermatotomy at the venous access site was also closed with Dermabond. IMPRESSION: Successful placement of a right IJ approach Power Port with ultrasound and fluoroscopic guidance. The catheter is ready for use. Electronically Signed   By: Jacqulynn Cadet M.D.   On: 05/06/2021 15:42     All questions were answered. The patient knows to call the clinic with any problems, questions or concerns. I have spent over 30 minutes in the care of this patient today including H and P, review of records, counseling and coordination of care. We discussed about the second biopsy results, treatment plan for R mini CHOP, counseling about side effects, number of cycles, growth factor support and repeat imaging    Benay Pike, MD 05/27/2021 2:45 PM

## 2021-05-27 NOTE — Addendum Note (Signed)
Addended by: Adaline Sill on: 05/27/2021 03:25 PM   Modules accepted: Orders

## 2021-05-27 NOTE — Progress Notes (Signed)
START OFF PATHWAY REGIMEN - Lymphoma and CLL   [id number]:R-miniCHOP (Rituximab IV + Cyclophosphamide IV + Doxorubicin IV + Vincristine IV + Prednisone PO) q21 Days x 6 Cycles:   A cycle is every 21 days:     Prednisone      Rituximab-xxxx      Cyclophosphamide      Doxorubicin      Vincristine   **Always confirm dose/schedule in your pharmacy ordering system**  Patient Characteristics: Diffuse Large B-Cell Lymphoma or Follicular Lymphoma, Grade 3B, First Line, Stage III and IV Disease Type: Not Applicable Disease Type: Diffuse Large B-Cell Lymphoma Disease Type: Not Applicable Line of therapy: First Line Intent of Therapy: Curative Intent, Discussed with Patient

## 2021-05-28 ENCOUNTER — Other Ambulatory Visit: Payer: Self-pay

## 2021-05-28 ENCOUNTER — Emergency Department (HOSPITAL_COMMUNITY): Payer: Medicare HMO

## 2021-05-28 ENCOUNTER — Encounter (HOSPITAL_COMMUNITY): Payer: Self-pay | Admitting: Emergency Medicine

## 2021-05-28 ENCOUNTER — Emergency Department (HOSPITAL_COMMUNITY)
Admission: EM | Admit: 2021-05-28 | Discharge: 2021-05-28 | Disposition: A | Discharge status: Home / Self Care | Payer: Medicare HMO | Attending: Emergency Medicine | Admitting: Emergency Medicine

## 2021-05-28 DIAGNOSIS — Z79899 Other long term (current) drug therapy: Secondary | ICD-10-CM | POA: Diagnosis not present

## 2021-05-28 DIAGNOSIS — D649 Anemia, unspecified: Secondary | ICD-10-CM | POA: Diagnosis not present

## 2021-05-28 DIAGNOSIS — C7951 Secondary malignant neoplasm of bone: Secondary | ICD-10-CM

## 2021-05-28 DIAGNOSIS — R0789 Other chest pain: Secondary | ICD-10-CM | POA: Diagnosis not present

## 2021-05-28 DIAGNOSIS — I10 Essential (primary) hypertension: Secondary | ICD-10-CM | POA: Insufficient documentation

## 2021-05-28 DIAGNOSIS — J9811 Atelectasis: Secondary | ICD-10-CM | POA: Diagnosis not present

## 2021-05-28 DIAGNOSIS — C8330 Diffuse large B-cell lymphoma, unspecified site: Secondary | ICD-10-CM

## 2021-05-28 DIAGNOSIS — R079 Chest pain, unspecified: Secondary | ICD-10-CM | POA: Diagnosis not present

## 2021-05-28 DIAGNOSIS — R600 Localized edema: Secondary | ICD-10-CM | POA: Insufficient documentation

## 2021-05-28 LAB — HIV-1 RNA QUANT-NO REFLEX-BLD
HIV 1 RNA Quant: <20 copies/mL
LOG10 HIV-1 RNA: UNDETERMINED log10copy/mL

## 2021-05-28 LAB — CBC
HCT: 26.3 % — ABNORMAL LOW (ref 36.0–46.0)
Hemoglobin: 8.3 g/dL — ABNORMAL LOW (ref 12.0–15.0)
MCH: 22.5 pg — ABNORMAL LOW (ref 26.0–34.0)
MCHC: 31.6 g/dL (ref 30.0–36.0)
MCV: 71.3 fL — ABNORMAL LOW (ref 80.0–100.0)
Platelets: 339 10*3/uL (ref 150–400)
RBC: 3.69 MIL/uL — ABNORMAL LOW (ref 3.87–5.11)
RDW: 18.7 % — ABNORMAL HIGH (ref 11.5–15.5)
WBC: 6.8 10*3/uL (ref 4.0–10.5)
nRBC: 0 % (ref 0.0–0.2)

## 2021-05-28 LAB — BASIC METABOLIC PANEL
Anion gap: 12 (ref 5–15)
BUN: 11 mg/dL (ref 8–23)
CO2: 27 mmol/L (ref 22–32)
Calcium: 8.9 mg/dL (ref 8.9–10.3)
Chloride: 101 mmol/L (ref 98–111)
Creatinine, Ser: 0.55 mg/dL (ref 0.44–1.00)
GFR, Estimated: >60 mL/min (ref >60)
Glucose, Bld: 94 mg/dL (ref 70–99)
Potassium: 3.1 mmol/L — ABNORMAL LOW (ref 3.5–5.1)
Sodium: 140 mmol/L (ref 135–145)

## 2021-05-28 LAB — TROPONIN I (HIGH SENSITIVITY)
Troponin I (High Sensitivity): 6 ng/L (ref <18)
Troponin I (High Sensitivity): 6 ng/L (ref <18)

## 2021-05-28 MED ORDER — SODIUM CHLORIDE (PF) 0.9 % IJ SOLN
INTRAMUSCULAR | Status: AC
  Filled 2021-05-28: qty 50

## 2021-05-28 MED ORDER — HYDROCODONE-ACETAMINOPHEN 5-325 MG PO TABS: 1.0000 | ORAL_TABLET | ORAL | 0 refills | Status: DC | PRN

## 2021-05-28 MED ORDER — IOHEXOL 350 MG/ML SOLN
80.0000 mL | Freq: Once | INTRAVENOUS | Status: AC | PRN
  Administered 2021-05-28: 80 mL via INTRAVENOUS

## 2021-05-28 MED ORDER — ONDANSETRON HCL 4 MG/2ML IJ SOLN
4.0000 mg | Freq: Once | INTRAMUSCULAR | Status: AC
Start: 2021-05-28 — End: 2021-05-28
  Administered 2021-05-28: 4 mg via INTRAVENOUS
  Filled 2021-05-28: qty 2

## 2021-05-28 MED ORDER — MORPHINE SULFATE (PF) 4 MG/ML IV SOLN
4.0000 mg | Freq: Once | INTRAVENOUS | Status: AC
  Administered 2021-05-28: 4 mg via INTRAVENOUS
  Filled 2021-05-28: qty 1

## 2021-05-28 NOTE — ED Triage Notes (Signed)
BIBA Per EMS: Pt coming from home with complaints of chest pain on the left side x2weeks. Pt got R sided port placed x3weeks ago. Pt states pain is constant and comes in waves of sharp feeling.

## 2021-05-28 NOTE — ED Provider Notes (Signed)
[facility] DEPT Provider Note   CSN: [id number] Arrival date & time: 05/28/21  [place]     History Chief Complaint  Patient presents with   Chest Pain    [name] is a 87 y.o. female.  Pt presents to the ED today with cp.  Pt said she has had cp on the left side ever since she had a port placed on the right side about 3 weeks ago (6/15).  Pt has been recently diagnosed with diffuse large b-cell lymphoma.  She did see her oncologist yesterday and did not mention the cp.  Chemo has not yet been started.  Plan to start chemo next week.  CP with movement.  No sob.         Past Medical History:  Diagnosis Date   Anemia    Arthritis    knees    GERD (gastroesophageal reflux disease)    Hiatal hernia    History of inguinal hernia repair    BIH   Hyperlipidemia    Hypertension    Memory problem     Patient Active Problem List   Diagnosis Date Noted   Diffuse large B-cell lymphoma ([place]) 05/27/2021   DLBCL (diffuse large B cell lymphoma) ([place]) 04/15/2021   Bilateral inguinal hernia 10/13/2011    Past Surgical History:  Procedure Laterality Date   COLONOSCOPY     HERNIA REPAIR  10/13/11   lap BIH rep w/ mesh- Dr. Tora Kindred HERNIA REPAIR  10/13/2011   Procedure: LAPAROSCOPIC INGUINAL HERNIA;  Surgeon: Adin Hector, MD;  Location: [facility];  Service: General;  Laterality: Bilateral;  Laparoscopic bilateral inguinal hernia repair, converted to open bilateral inguinal hernia repairs with mesh   IR IMAGING GUIDED PORT INSERTION  05/06/2021   NASAL ENDOSCOPY Left 04/08/2021   Procedure: NASAL ENDOSCOPY WITH BIOPSY OF LEFT NASOPPHARYNGEAL MASS;  Surgeon: Jason Coop, DO;  Location: [facility];  Service: ENT;  Laterality: Left;  Frozen Section     OB History   No obstetric history on file.     Family History  Problem Relation Age of Onset   Heart disease Mother     Social History   Tobacco Use   Smoking status: Never    Smokeless tobacco: Never  Vaping Use   Vaping Use: Never used  Substance Use Topics   Alcohol use: No   Drug use: No    Home Medications Prior to Admission medications   Medication Sig Start Date End Date Taking? Authorizing Provider  HYDROcodone-acetaminophen (NORCO/VICODIN) 5-325 MG tablet Take 1 tablet by mouth every 4 (four) hours as needed. 05/28/21  Yes Isla Pence, MD  acetaminophen (TYLENOL) 500 MG tablet Take 1,000 mg by mouth every 6 (six) hours as needed for mild pain or fever.    [provider]  allopurinol (ZYLOPRIM) 300 MG tablet Take 1 tablet (300 mg total) by mouth daily. 04/29/21   Benay Pike, MD  amLODipine (NORVASC) 10 MG tablet Take 10 mg by mouth daily.    [provider]  atorvastatin (LIPITOR) 80 MG tablet Take 80 mg by mouth every morning.    [provider]  ezetimibe (ZETIA) 10 MG tablet Take 10 mg by mouth every morning.    [provider]  triamterene-hydrochlorothiazide (MAXZIDE-25) 37.5-25 MG tablet Take 1 tablet by mouth every morning.    [provider]    Allergies    Patient has no known allergies.  Review of Systems  Review of Systems  Cardiovascular:  Positive for chest pain.  All other systems reviewed and are negative.  Physical Exam Updated Vital Signs BP 133/68   Pulse 88   Temp 98.2 F (36.8 C) (Oral)   Resp 14   SpO2 98%   Physical Exam Vitals and nursing note reviewed.  Constitutional:      Appearance: She is well-developed.  HENT:     Head: Normocephalic and atraumatic.  Eyes:     Extraocular Movements: Extraocular movements intact.     Pupils: Pupils are equal, round, and reactive to light.  Cardiovascular:     Rate and Rhythm: Normal rate and regular rhythm.     Heart sounds: Normal heart sounds.  Pulmonary:     Effort: Pulmonary effort is normal.     Breath sounds: Normal breath sounds.  Chest:     Comments: Tenderness to palpation to left chest wall Abdominal:      General: Bowel sounds are normal.     Palpations: Abdomen is soft.  Musculoskeletal:     Cervical back: Normal range of motion and neck supple.     Right lower leg: Edema present.     Left lower leg: Edema present.  Skin:    General: Skin is warm.     Capillary Refill: Capillary refill takes less than 2 seconds.  Neurological:     General: No focal deficit present.     Mental Status: She is alert and oriented to person, place, and time.  Psychiatric:        Mood and Affect: Mood normal.        Behavior: Behavior normal.    ED Results / Procedures / Treatments   Labs (all labs ordered are listed, but only abnormal results are displayed) Labs Reviewed  BASIC METABOLIC PANEL - Abnormal; Notable for the following components:      Result Value   Potassium 3.1 (*)    All other components within normal limits  CBC - Abnormal; Notable for the following components:   RBC 3.69 (*)    Hemoglobin 8.3 (*)    HCT 26.3 (*)    MCV 71.3 (*)    MCH 22.5 (*)    RDW 18.7 (*)    All other components within normal limits  TROPONIN I (HIGH SENSITIVITY)  TROPONIN I (HIGH SENSITIVITY)    EKG EKG Interpretation  Date/Time:  Thursday May 28 2021 18:05:45 EDT Ventricular Rate:  93 PR Interval:  200 QRS Duration: 86 QT Interval:  350 QTC Calculation: 435 R Axis:   -10 Text Interpretation: Normal sinus rhythm Normal ECG No significant change since last tracing Confirmed by Isla Pence [phone]) on 05/28/2021 7:44:40 PM  Radiology CT Angio Chest PE W and/or Wo Contrast  Result Date: 05/28/2021 CLINICAL DATA:  Chest pain.  Lymphoma. EXAM: CT ANGIOGRAPHY CHEST WITH CONTRAST TECHNIQUE: Multidetector CT imaging of the chest was performed using the standard protocol during bolus administration of intravenous contrast. Multiplanar CT image reconstructions and MIPs were obtained to evaluate the vascular anatomy. CONTRAST:  65mL OMNIPAQUE IOHEXOL 350 MG/ML SOLN COMPARISON:  Chest radiograph dated  05/28/2021. CT abdomen/pelvis dated 05/15/2021. PET-CT dated 04/28/2021. FINDINGS: Cardiovascular: Satisfactory opacification the bilateral pulmonary arteries to the segmental level. Evaluation is mildly constrained by respiratory motion. Within that constraint, there is no evidence of pulmonary embolism. Although not tailored for evaluation of the thoracic aorta, there is no evidence thoracic aortic aneurysm or dissection. Atherosclerotic calcifications of the aortic arch. The heart is normal  in size. No pericardial effusion. Right chest port terminates at the cavoatrial junction. Three vessel coronary atherosclerosis. Mediastinum/Nodes: No suspicious mediastinal lymphadenopathy. Visualized thyroid is unremarkable. Lungs/Pleura: No suspicious pulmonary nodules. Mild scarring/atelectasis in the bilateral lower lobes. No focal consolidation. No pleural effusion or pneumothorax. Upper Abdomen: Visualized upper abdomen is notable for a dominant 5.3 cm hepatic metastasis (series 4/image 102). Additional hepatic metastases are better evaluated on recent CT abdomen/pelvis. Musculoskeletal: Lytic lesion in the left sternum/manubrium (series 4/image 36). Lytic rib lesions, for example the left lateral 7th rib (series 4/image 73). Lytic lesion in the left glenoid (series 4/image 15). These findings are better evaluated on recent PET-CT. Review of the MIP images confirms the above findings. IMPRESSION: No evidence of pulmonary embolism. Hepatic metastases, incompletely visualized, better evaluated on recent CT abdomen/pelvis. Multifocal osseous metastases, better evaluated on recent PET-CT. Aortic Atherosclerosis (ICD10-I70.0). Electronically Signed   By: Julian Hy M.D.   On: 05/28/2021 21:19   DG Chest Port 1 View  Result Date: 05/28/2021 CLINICAL DATA:  Chest pain EXAM: PORTABLE CHEST 1 VIEW COMPARISON:  Chest x-ray 05/14/2021. FINDINGS: Right chest wall Port-A-Cath with tip overlying the superior cavoatrial  junction. The heart size and mediastinal contours are unchanged. Left base atelectasis. Vague opacity posterior to the Port-A-Cath overlying the right mid lung zone. No pulmonary edema. No pleural effusion. No pneumothorax. No acute osseous abnormality. Bilateral shoulder, right greater than left, degenerative changes. IMPRESSION: 1. Vague opacity posterior to the Port-A-Cath overlying the right mid lung zone. Finding likely related to Port-A-Cath. Recommend repeating PA and lateral view of chest. 2. Otherwise no acute active cardiopulmonary abnormality. Electronically Signed   By: Iven Finn M.D.   On: 05/28/2021 18:53    Procedures Procedures   Medications Ordered in ED Medications  morphine 4 MG/ML injection 4 mg (4 mg Intravenous Given 05/28/21 1921)  ondansetron (ZOFRAN) injection 4 mg (4 mg Intravenous Given 05/28/21 1921)  sodium chloride (PF) 0.9 % injection (  Given by Other 05/28/21 2044)  iohexol (OMNIPAQUE) 350 MG/ML injection 80 mL (80 mLs Intravenous Contrast Given 05/28/21 2044)    ED Course  I have reviewed the triage vital signs and the nursing notes.  Pertinent labs & imaging results that were available during my care of the patient were reviewed by me and considered in my medical decision making (see chart for details).    MDM Rules/Calculators/A&P                          Pt is feeling better after the pain meds.  She is anemic, but this is chronic.  She had a CT chest to r/o pe.  No pe.  She does have several bony mets which I believe to be the source of her pain.  Pt is d/c with lortab.  She knows this can cause constipation and she is to take fiber/stool softeners if needed.  She knows to return if worse.  Final Clinical Impression(s) / ED Diagnoses Final diagnoses:  Chest wall pain  Bony metastasis (HCC)  Chronic anemia  Diffuse large B-cell lymphoma, unspecified body region [facility])    Rx / DC Orders ED Discharge Orders          Ordered     HYDROcodone-acetaminophen (NORCO/VICODIN) 5-325 MG tablet  Every 4 hours PRN        05/28/21 2128             Isla Pence, MD 05/28/21 2131

## 2021-05-28 NOTE — ED Notes (Signed)
Pt to CT via stretcher

## 2021-05-29 ENCOUNTER — Telehealth: Payer: Self-pay | Admitting: Hematology and Oncology

## 2021-05-29 ENCOUNTER — Encounter: Payer: Self-pay | Admitting: Hematology and Oncology

## 2021-05-29 ENCOUNTER — Inpatient Hospital Stay: Payer: Medicare HMO

## 2021-05-29 MED ORDER — PREDNISONE 20 MG PO TABS: 60.0000 mg | ORAL_TABLET | Freq: Every day | ORAL | 5 refills | Status: DC

## 2021-05-29 MED ORDER — LIDOCAINE-PRILOCAINE 2.5-2.5 % EX CREA: TOPICAL_CREAM | CUTANEOUS | 3 refills | Status: DC

## 2021-05-29 MED ORDER — PROCHLORPERAZINE MALEATE 10 MG PO TABS: 10.0000 mg | ORAL_TABLET | Freq: Four times a day (QID) | ORAL | 6 refills | Status: DC | PRN

## 2021-05-29 MED ORDER — ONDANSETRON HCL 8 MG PO TABS: 8.0000 mg | ORAL_TABLET | Freq: Two times a day (BID) | ORAL | 1 refills | Status: DC | PRN

## 2021-05-29 MED ORDER — LORAZEPAM 0.5 MG PO TABS: 0.5000 mg | ORAL_TABLET | Freq: Four times a day (QID) | ORAL | 0 refills | Status: DC | PRN

## 2021-05-29 NOTE — Addendum Note (Signed)
Addended by: Jolaine Click on: 05/29/2021 10:42 AM   Modules accepted: Orders

## 2021-05-29 NOTE — Telephone Encounter (Signed)
Scheduled per 07/06 los, spoke with patient's son. Patient will be notified of upcoming appointments.

## 2021-05-29 NOTE — Progress Notes (Signed)
Met with patient and accompanying adult at registration to introduce myself as Arboriculturist and to offer available resources.  Discussed one-time $1000 Radio broadcast assistant to assist with personal expenses while going through treatment. Advised what is needed to apply.  Gave her my card if interested in applying and for any additional financial questions or concerns.

## 2021-06-02 ENCOUNTER — Other Ambulatory Visit: Payer: Medicare HMO

## 2021-06-03 ENCOUNTER — Encounter: Payer: Self-pay | Admitting: Hematology and Oncology

## 2021-06-03 ENCOUNTER — Inpatient Hospital Stay: Payer: Medicare HMO

## 2021-06-03 ENCOUNTER — Other Ambulatory Visit: Payer: Self-pay

## 2021-06-03 ENCOUNTER — Inpatient Hospital Stay: Payer: Medicare HMO | Admitting: Hematology and Oncology

## 2021-06-03 VITALS — BP 113/66 | HR 82 | Temp 97.5°F | Resp 16

## 2021-06-03 DIAGNOSIS — C8330 Diffuse large B-cell lymphoma, unspecified site: Secondary | ICD-10-CM

## 2021-06-03 DIAGNOSIS — Z5189 Encounter for other specified aftercare: Secondary | ICD-10-CM | POA: Diagnosis not present

## 2021-06-03 DIAGNOSIS — C7951 Secondary malignant neoplasm of bone: Secondary | ICD-10-CM | POA: Diagnosis not present

## 2021-06-03 DIAGNOSIS — C8331 Diffuse large B-cell lymphoma, lymph nodes of head, face, and neck: Secondary | ICD-10-CM | POA: Diagnosis not present

## 2021-06-03 DIAGNOSIS — I700 Atherosclerosis of aorta: Secondary | ICD-10-CM | POA: Diagnosis not present

## 2021-06-03 DIAGNOSIS — C787 Secondary malignant neoplasm of liver and intrahepatic bile duct: Secondary | ICD-10-CM | POA: Diagnosis not present

## 2021-06-03 DIAGNOSIS — Z5112 Encounter for antineoplastic immunotherapy: Secondary | ICD-10-CM | POA: Diagnosis not present

## 2021-06-03 DIAGNOSIS — R5383 Other fatigue: Secondary | ICD-10-CM | POA: Diagnosis not present

## 2021-06-03 DIAGNOSIS — Z5111 Encounter for antineoplastic chemotherapy: Secondary | ICD-10-CM | POA: Diagnosis not present

## 2021-06-03 DIAGNOSIS — M7989 Other specified soft tissue disorders: Secondary | ICD-10-CM | POA: Diagnosis not present

## 2021-06-03 LAB — CBC WITH DIFFERENTIAL (CANCER CENTER ONLY)
Abs Immature Granulocytes: 0.01 10*3/uL (ref 0.00–0.07)
Basophils Absolute: 0 10*3/uL (ref 0.0–0.1)
Basophils Relative: 0 %
Eosinophils Absolute: 0 10*3/uL (ref 0.0–0.5)
Eosinophils Relative: 0 %
HCT: 25.3 % — ABNORMAL LOW (ref 36.0–46.0)
Hemoglobin: 8.1 g/dL — ABNORMAL LOW (ref 12.0–15.0)
Immature Granulocytes: 0 %
Lymphocytes Relative: 22 %
Lymphs Abs: 1.1 10*3/uL (ref 0.7–4.0)
MCH: 22.4 pg — ABNORMAL LOW (ref 26.0–34.0)
MCHC: 32 g/dL (ref 30.0–36.0)
MCV: 69.9 fL — ABNORMAL LOW (ref 80.0–100.0)
Monocytes Absolute: 0.3 10*3/uL (ref 0.1–1.0)
Monocytes Relative: 5 %
Neutro Abs: 3.8 10*3/uL (ref 1.7–7.7)
Neutrophils Relative %: 73 %
Platelet Count: 287 10*3/uL (ref 150–400)
RBC: 3.62 MIL/uL — ABNORMAL LOW (ref 3.87–5.11)
RDW: 18 % — ABNORMAL HIGH (ref 11.5–15.5)
WBC Count: 5.1 10*3/uL (ref 4.0–10.5)
nRBC: 0.4 % — ABNORMAL HIGH (ref 0.0–0.2)

## 2021-06-03 LAB — CMP (CANCER CENTER ONLY)
ALT: 16 U/L (ref 0–44)
AST: 20 U/L (ref 15–41)
Albumin: 3 g/dL — ABNORMAL LOW (ref 3.5–5.0)
Alkaline Phosphatase: 131 U/L — ABNORMAL HIGH (ref 38–126)
Anion gap: 14 (ref 5–15)
BUN: 16 mg/dL (ref 8–23)
CO2: 25 mmol/L (ref 22–32)
Calcium: 9.5 mg/dL (ref 8.9–10.3)
Chloride: 101 mmol/L (ref 98–111)
Creatinine: 0.71 mg/dL (ref 0.44–1.00)
GFR, Estimated: >60 mL/min (ref >60)
Glucose, Bld: 142 mg/dL — ABNORMAL HIGH (ref 70–99)
Potassium: 3.1 mmol/L — ABNORMAL LOW (ref 3.5–5.1)
Sodium: 140 mmol/L (ref 135–145)
Total Bilirubin: 0.3 mg/dL (ref 0.3–1.2)
Total Protein: 8 g/dL (ref 6.5–8.1)

## 2021-06-03 LAB — HEPATITIS B SURFACE ANTIGEN: Hepatitis B Surface Ag: NONREACTIVE

## 2021-06-03 LAB — FERRITIN: Ferritin: 774 ng/mL — ABNORMAL HIGH (ref 11–307)

## 2021-06-03 LAB — IRON AND TIBC
Iron: 39 ug/dL — ABNORMAL LOW (ref 41–142)
Saturation Ratios: 22 % (ref 21–57)
TIBC: 175 ug/dL — ABNORMAL LOW (ref 236–444)
UIBC: 136 ug/dL (ref 120–384)

## 2021-06-03 LAB — HEPATITIS B CORE ANTIBODY, TOTAL: Hep B Core Total Ab: NONREACTIVE

## 2021-06-03 MED ORDER — ACETAMINOPHEN 325 MG PO TABS
ORAL_TABLET | ORAL | Status: AC
  Filled 2021-06-03: qty 2

## 2021-06-03 MED ORDER — ACETAMINOPHEN 325 MG PO TABS
650.0000 mg | ORAL_TABLET | Freq: Once | ORAL | Status: AC
Start: 2021-06-03 — End: 2021-06-03
  Administered 2021-06-03: 650 mg via ORAL

## 2021-06-03 MED ORDER — PALONOSETRON HCL INJECTION 0.25 MG/5ML
INTRAVENOUS | Status: AC
  Filled 2021-06-03: qty 5

## 2021-06-03 MED ORDER — SODIUM CHLORIDE 0.9 % IV SOLN
150.0000 mg | Freq: Once | INTRAVENOUS | Status: AC
  Administered 2021-06-03: 150 mg via INTRAVENOUS
  Filled 2021-06-03: qty 150

## 2021-06-03 MED ORDER — DEXAMETHASONE SODIUM PHOSPHATE 100 MG/10ML IJ SOLN
10.0000 mg | Freq: Once | INTRAMUSCULAR | Status: AC
  Administered 2021-06-03: 10 mg via INTRAVENOUS
  Filled 2021-06-03: qty 10

## 2021-06-03 MED ORDER — SODIUM CHLORIDE 0.9 % IV SOLN
Freq: Once | INTRAVENOUS | Status: AC
  Filled 2021-06-03: qty 250

## 2021-06-03 MED ORDER — PALONOSETRON HCL INJECTION 0.25 MG/5ML
0.2500 mg | Freq: Once | INTRAVENOUS | Status: AC
  Administered 2021-06-03: 0.25 mg via INTRAVENOUS

## 2021-06-03 MED ORDER — SODIUM CHLORIDE 0.9% FLUSH
10.0000 mL | INTRAVENOUS | Status: DC | PRN
  Administered 2021-06-03: 10 mL
  Filled 2021-06-03: qty 10

## 2021-06-03 MED ORDER — HEPARIN SOD (PORK) LOCK FLUSH 100 UNIT/ML IV SOLN
500.0000 [IU] | Freq: Once | INTRAVENOUS | Status: AC | PRN
  Administered 2021-06-03: 500 [IU]
  Filled 2021-06-03: qty 5

## 2021-06-03 MED ORDER — DIPHENHYDRAMINE HCL 25 MG PO CAPS
50.0000 mg | ORAL_CAPSULE | Freq: Once | ORAL | Status: AC
  Administered 2021-06-03: 50 mg via ORAL

## 2021-06-03 MED ORDER — DIPHENHYDRAMINE HCL 25 MG PO CAPS
ORAL_CAPSULE | ORAL | Status: AC
  Filled 2021-06-03: qty 2

## 2021-06-03 MED ORDER — VINCRISTINE SULFATE CHEMO INJECTION 1 MG/ML
1.0000 mg | Freq: Once | INTRAVENOUS | Status: AC
  Administered 2021-06-03: 1 mg via INTRAVENOUS
  Filled 2021-06-03: qty 1

## 2021-06-03 MED ORDER — SODIUM CHLORIDE 0.9 % IV SOLN
400.0000 mg/m2 | Freq: Once | INTRAVENOUS | Status: AC
  Administered 2021-06-03: 560 mg via INTRAVENOUS
  Filled 2021-06-03: qty 28

## 2021-06-03 MED ORDER — SODIUM CHLORIDE 0.9 % IV SOLN
375.0000 mg/m2 | Freq: Once | INTRAVENOUS | Status: AC
  Administered 2021-06-03: 500 mg via INTRAVENOUS
  Filled 2021-06-03: qty 50

## 2021-06-03 MED ORDER — DOXORUBICIN HCL CHEMO IV INJECTION 2 MG/ML
25.0000 mg/m2 | Freq: Once | INTRAVENOUS | Status: AC
  Administered 2021-06-03: 36 mg via INTRAVENOUS
  Filled 2021-06-03: qty 18

## 2021-06-03 NOTE — Progress Notes (Signed)
[facility] NOTE  Patient Care Team: Antony Contras, MD as PCP - General (Family Medicine)  CHIEF COMPLAINTS/PURPOSE OF CONSULTATION:  DLBCL of nasopharynx.  ASSESSMENT & PLAN:  GCB subtype, DLBCL  No problem-specific Assessment & Plan notes found for this encounter.  No orders of the defined types were placed in this encounter.  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 Labs with some microcytosis and moderate anemia. Ok to proceed with chemo Will will proceed with VASC US of BLE We will monitor her labs and arrange for transfusion if Hb less than 7 CNS IPI risk score is high, but I dont believe she will be a candidate for CNS prophylaxis at this time. We will proceed with R mini CHOP induction and re evaluate her tolerance for IT MTX at the end of induction chemo. She should be getting Neulasta after each cycle of chemo. RTC in 3 weeks as scheduled.   HISTORY OF PRESENTING ILLNESS:   [name] 87 y.o. female is here because of new diagnosis of DLBCL of nasopharynx.  [name] is a very pleasant 87 year old female patient who arrived today with her granddaughter [name] to the appointment.  She does not quite remember things as explained and has short-term memory loss.  After a bit of the conversation, she did admit to me that she was told about a cancer in her nose which was biopsied last week but she cannot remember the details.  [name] mentioned that for the past year or so [name] and has progressively  lost weight in the past couple months of at least 20 pounds, has become relatively more sedentary and inactive, has no appetite.  They initially thought she might be having a stroke when she was not getting out of bed for 3days and has not been eating.  When she was in the ER.  She had some imaging CT head without contrast which showed left maxillary and ethmoid sinus disease. She then had MRI brain on the same day which showed 3.2 x 2 x 3.5 cm heterogeneous mass positioned at the left nasopharynx near the fossa of Rosenmuller.  Finding is most concerning for a primary nasopharyngeal neoplasm.  ENT consultation was recommended.  She then went on to see [name].She had nasal endoscopy with biopsy of left nasopharyngeal mass.  According to Dr. Roseanna Rainbow, polypoid mass lesion was noted to be lymphoma from the posterior aspect of the left middle turbinate.  Pathology showed findings consistent with diffuse large B-cell lymphoma of the left nasopharyngeal mass as well as left middle turbinate.   PET/CT showed hypermetabolic left nasopharyngeal mass, no associated neck adenopathy, 2 large hypermetabolic liver lesions, destructive lytic metastatic bone lesions.  Since there was no lymphadenopathy and no unusual appearance of spleen, second biopsy was recommended to confirm the diagnosis.  Left ilium  Biopsy showed findings consistent with involvement by non-Hodgkin B-cell lymphoma.  Features are similar to previously known large B-cell lymphoma consistent with involvement by the same disease process.  Interval History  She is here for  consideration for planned first cycle of chemotherapy. She got her premeds by the time she saw me, so was a bit confused but appears to be in no distress. She denies any pain. Some nasal stuffiness. BLE swelling which is bother some to her. No change in breathing During her last visit, we reviewed that she is taking the allopurinol. Rest of the ROS reviewed and  negative  MEDICAL HISTORY:  Past Medical History:  Diagnosis Date   Anemia    Arthritis    knees    GERD (gastroesophageal reflux disease)    Hiatal hernia    History of inguinal hernia repair    BIH   Hyperlipidemia    Hypertension    Memory problem     SURGICAL HISTORY: Past Surgical History:  Procedure Laterality Date   COLONOSCOPY     HERNIA REPAIR  10/13/11   lap BIH rep w/ mesh- Dr. Tora Kindred HERNIA REPAIR  10/13/2011   Procedure: LAPAROSCOPIC INGUINAL HERNIA;  Surgeon: Adin Hector, MD;  Location: [facility];  Service: General;  Laterality: Bilateral;  Laparoscopic bilateral inguinal hernia repair, converted to open bilateral inguinal hernia repairs with mesh   IR IMAGING GUIDED PORT INSERTION  05/06/2021   NASAL ENDOSCOPY Left 04/08/2021   Procedure: NASAL ENDOSCOPY WITH BIOPSY OF LEFT NASOPPHARYNGEAL MASS;  Surgeon: Jason Coop, DO;  Location: [facility];  Service: ENT;  Laterality: Left;  Frozen Section    SOCIAL HISTORY: Social History   Socioeconomic History   Marital status: Single    Spouse name: Not on file   Number of children: Not on file   Years of education: Not on file   Highest education level: Not on file  Occupational History   Not on file  Tobacco Use   Smoking status: Never   Smokeless tobacco: Never  Vaping Use   Vaping Use: Never used  Substance and Sexual Activity   Alcohol use: No   Drug use: No   Sexual activity: Not on file  Other Topics Concern   Not on file  Social History Narrative   Not on file   Social Determinants of Health   Financial Resource Strain: Not on file  Food Insecurity: Not on file  Transportation Needs: Not on file  Physical Activity: Not on file  Stress: Not on file  Social Connections: Not on file  Intimate Partner Violence: Not on file    FAMILY HISTORY: Family History  Problem Relation Age of Onset   Heart disease Mother     ALLERGIES:  has No Known Allergies.  MEDICATIONS:   Current Outpatient Medications  Medication Sig Dispense Refill   acetaminophen (TYLENOL) 500 MG tablet Take 1,000 mg by mouth every 6 (six) hours as needed for mild pain or fever.     allopurinol (ZYLOPRIM) 300 MG tablet Take 1 tablet (300 mg total) by mouth daily. 30 tablet 0   amLODipine (NORVASC) 10 MG tablet Take 10 mg by mouth daily.     atorvastatin (LIPITOR) 80 MG tablet Take 80 mg by mouth every morning.     ezetimibe (ZETIA) 10 MG tablet Take 10 mg by mouth every morning.     HYDROcodone-acetaminophen (NORCO/VICODIN) 5-325 MG tablet Take 1 tablet by mouth every 4 (four) hours as needed. 15 tablet 0   lidocaine-prilocaine (EMLA) cream Apply to affected area once 30 g 3   LORazepam (ATIVAN) 0.5 MG tablet Take 1 tablet (0.5 mg total) by mouth every 6 (six) hours  as needed (Nausea or vomiting). 30 tablet 0   ondansetron (ZOFRAN) 8 MG tablet Take 1 tablet (8 mg total) by mouth 2 (two) times daily as needed for refractory nausea / vomiting. Start on day 3 after cyclophosphamide chemotherapy. 30 tablet 1   predniSONE (DELTASONE) 20 MG tablet Take 3 tablets (60 mg total) by mouth daily. Take with food on days 1-5 of chemotherapy. 15 tablet 5   prochlorperazine (COMPAZINE) 10 MG tablet Take 1 tablet (10 mg total) by mouth every 6 (six) hours as needed (Nausea or vomiting). 30 tablet 6   triamterene-hydrochlorothiazide (MAXZIDE-25) 37.5-25 MG tablet Take 1 tablet by mouth every morning.     No current facility-administered medications for this visit.   Facility-Administered Medications Ordered in Other Visits  Medication Dose Route Frequency Provider Last Rate Last Admin   cyclophosphamide (CYTOXAN) 560 mg in sodium chloride 0.9 % 250 mL chemo infusion  400 mg/m2 (Treatment Plan Recorded) Intravenous Once Benay Pike, MD 556 mL/hr at 06/03/21 1532 560 mg at 06/03/21 1532   heparin lock flush 100 unit/mL  500 Units Intracatheter Once PRN Karthikeya Funke, MD       sodium chloride flush (NS)  0.9 % injection 10 mL  10 mL Intracatheter PRN Cadi Rhinehart, MD        PHYSICAL EXAMINATION:  ECOG PERFORMANCE STATUS: 1 - Symptomatic but completely ambulatory   Physical Exam Constitutional:      Appearance: Normal appearance.  HENT:     Head: Normocephalic and atraumatic.  Eyes:     Pupils: Pupils are equal, round, and reactive to light.  Cardiovascular:     Rate and Rhythm: Normal rate and regular rhythm.     Pulses: Normal pulses.     Heart sounds: Normal heart sounds.  Pulmonary:     Effort: Pulmonary effort is normal.     Breath sounds: Normal breath sounds.  Abdominal:     General: Abdomen is flat. Bowel sounds are normal.     Palpations: Abdomen is soft.  Musculoskeletal:        General: Swelling present. No tenderness.     Cervical back: Normal range of motion and neck supple. No rigidity.  Lymphadenopathy:     Cervical: No cervical adenopathy.  Skin:    General: Skin is warm and dry.  Neurological:     General: No focal deficit present.     Mental Status: She is alert.  Psychiatric:        Mood and Affect: Mood normal.    2+ BLE  noted bilaterally    LABORATORY DATA:  I have reviewed the data as listed Lab Results  Component Value Date   WBC 5.1 06/03/2021   HGB 8.1 (L) 06/03/2021   HCT 25.3 (L) 06/03/2021   MCV 69.9 (L) 06/03/2021   PLT 287 06/03/2021     Chemistry      Component Value Date/Time   NA 140 06/03/2021 0934   K 3.1 (L) 06/03/2021 0934   CL 101 06/03/2021 0934   CO2 25 06/03/2021 0934   BUN 16 06/03/2021 0934   CREATININE 0.71 06/03/2021 0934      Component Value Date/Time   CALCIUM 9.5 06/03/2021 0934   ALKPHOS 131 (H) 06/03/2021 0934   AST 20 06/03/2021 0934   ALT 16 06/03/2021 0934   BILITOT 0.3 06/03/2021 0934      RADIOGRAPHIC STUDIES: I have personally reviewed the radiological images as listed and agreed with the findings in the report. DG Chest  2 View  Result Date: 05/14/2021 CLINICAL DATA:  Chest pain and  shortness of breath. EXAM: CHEST - 2 VIEW COMPARISON:  Head CT 04/28/2021 FINDINGS: Right chest port in place. Normal heart size. Aortic atherosclerosis and tortuosity. Linear atelectasis in the left lower lobe with trace left pleural effusion. No confluent airspace disease. No pulmonary edema. No pneumothorax. The known hypermetabolic bone lesions on recent PET are not well-defined by radiograph. IMPRESSION: Linear atelectasis in the left lower lobe with trace left pleural effusion. Electronically Signed   By: Keith Rake M.D.   On: 05/14/2021 22:53   CT Angio Chest PE W and/or Wo Contrast  Result Date: 05/28/2021 CLINICAL DATA:  Chest pain.  Lymphoma. EXAM: CT ANGIOGRAPHY CHEST WITH CONTRAST TECHNIQUE: Multidetector CT imaging of the chest was performed using the standard protocol during bolus administration of intravenous contrast. Multiplanar CT image reconstructions and MIPs were obtained to evaluate the vascular anatomy. CONTRAST:  35mL OMNIPAQUE IOHEXOL 350 MG/ML SOLN COMPARISON:  Chest radiograph dated 05/28/2021. CT abdomen/pelvis dated 05/15/2021. PET-CT dated 04/28/2021. FINDINGS: Cardiovascular: Satisfactory opacification the bilateral pulmonary arteries to the segmental level. Evaluation is mildly constrained by respiratory motion. Within that constraint, there is no evidence of pulmonary embolism. Although not tailored for evaluation of the thoracic aorta, there is no evidence thoracic aortic aneurysm or dissection. Atherosclerotic calcifications of the aortic arch. The heart is normal in size. No pericardial effusion. Right chest port terminates at the cavoatrial junction. Three vessel coronary atherosclerosis. Mediastinum/Nodes: No suspicious mediastinal lymphadenopathy. Visualized thyroid is unremarkable. Lungs/Pleura: No suspicious pulmonary nodules. Mild scarring/atelectasis in the bilateral lower lobes. No focal consolidation. No pleural effusion or pneumothorax. Upper Abdomen: Visualized  upper abdomen is notable for a dominant 5.3 cm hepatic metastasis (series 4/image 102). Additional hepatic metastases are better evaluated on recent CT abdomen/pelvis. Musculoskeletal: Lytic lesion in the left sternum/manubrium (series 4/image 36). Lytic rib lesions, for example the left lateral 7th rib (series 4/image 73). Lytic lesion in the left glenoid (series 4/image 15). These findings are better evaluated on recent PET-CT. Review of the MIP images confirms the above findings. IMPRESSION: No evidence of pulmonary embolism. Hepatic metastases, incompletely visualized, better evaluated on recent CT abdomen/pelvis. Multifocal osseous metastases, better evaluated on recent PET-CT. Aortic Atherosclerosis (ICD10-I70.0). Electronically Signed   By: Julian Hy M.D.   On: 05/28/2021 21:19   CT ABDOMEN PELVIS W CONTRAST  Result Date: 05/15/2021 CLINICAL DATA:  Abdominal abscess EXAM: CT ABDOMEN AND PELVIS WITH CONTRAST TECHNIQUE: Multidetector CT imaging of the abdomen and pelvis was performed using the standard protocol following bolus administration of intravenous contrast. CONTRAST:  42mL OMNIPAQUE IOHEXOL 300 MG/ML  SOLN COMPARISON:  PET CT 04/28/2021 FINDINGS: Lower chest: The visualized lung bases are clear bilaterally. Extensive multi-vessel coronary artery calcification. Global cardiac size within normal limits. Hepatobiliary: There are 3 hypoechoic masses identified within the liver corresponding to the hypermetabolic lesion seen on prior PET CT examination measuring up to 4.4 x 4.9 cm, unchanged from prior examination. The third lesion identified within the central aspect of segment 4 B is better visualized on the current exam. There are, additionally, at least 3 additional indeterminate subcentimeter hypoechoic masses identified within the right hepatic lobe which may represent additional foci of metastatic disease, but are indeterminate. No intra or extrahepatic biliary ductal dilation.  Gallbladder unremarkable. Pancreas: Unremarkable Spleen: Unremarkable Adrenals/Urinary Tract: The adrenal glands are unremarkable. The kidneys are normal in size and position. There is mild left hydronephrosis and dilation of the left renal pelvis  with decompression of the left ureter in keeping with a probable mild left UPJ obstruction. No intrarenal or ureteral calculi. No enhancing intrarenal masses. No hydronephrosis on the right. The bladder is mildly distended, but is otherwise unremarkable. Stomach/Bowel: The stomach, large bowel, and small bowel are unremarkable. No evidence of obstruction or focal inflammation. No free intraperitoneal gas or fluid. Vascular/Lymphatic: There is extensive aortoiliac atherosclerotic calcification. No aortic aneurysm. No pathologic adenopathy within the abdomen and pelvis. Reproductive: Uterus and bilateral adnexa are unremarkable. Other: Mild diffuse subcutaneous body wall edema is in keeping with mild anasarca. No abdominal wall hernia. Musculoskeletal: Multiple lytic lesions are identified within the visualized axial skeleton, better visualized on prior PET CT examination, with the largest seen within the right iliac spine measuring 3.2 x 2.8 cm at axial image # 38/2. IMPRESSION: Finding similar to prior PET CT examination with multiple hepatic masses and multiple osseous lytic lesions compatible with metastatic foci of unknown primary. As noted previously, this would be an unusual presentation for lymphoma and percutaneous sampling should be considered for further evaluation. Lytic lesion within the iliac spine is largely comprised of soft tissue and multiple hepatic lesions are relatively superficial and these lesions should be amenable to CT-guided or ultrasound-guided biopsy, respectively, for further evaluation. Aortic Atherosclerosis (ICD10-I70.0). Electronically Signed   By: Fidela Salisbury MD   On: 05/15/2021 02:01   CT BIOPSY  Result Date: 05/14/2021 INDICATION:  Recent diagnosis of lymphoma, now with multiple hypermetabolic osseous lesions. Please CT-guided biopsy tissue diagnostic. EXAM: CT-GUIDED BIOPSY OF LYTIC LESION INVOLVING THE ANTERIOR ASPECT MEDICATIONS: None ANESTHESIA/SEDATION: Fentanyl 50 mcg IV; Versed 1 mg IV Sedation Time: 18 Minutes; The patient was continuously monitored during the procedure by the interventional radiology nurse under my direct supervision. COMPLICATIONS: None immediate. PROCEDURE: Informed consent was obtained from the patient following an explanation of the procedure, risks, benefits and alternatives. The patient understands, agrees and consents for the procedure. All questions were addressed. A time out was performed prior to the initiation of the procedure. The patient was positioned supine and non-contrast localization CT was performed of the pelvis demonstrating unchanged size appearance of the approximately 1.8 x 1.6 cm lytic lesion involving anterior aspect of the left ilium (image 7 series 2). The operative site was prepped and draped in the usual sterile fashion. Under sterile conditions and local anesthesia, a 22 gauge spinal needle was utilized for procedural planning. Next, an 11 gauge coaxial bone biopsy needle was advanced into the lytic lesion involving the anterior aspect left ilium. Appropriate position was confirmed with CT imaging. Initially, a bone biopsy was obtained with inner 13 gauge bone needle biopsy device (image 8, series 7) followed by additional bone biopsy with the outer 11 gauge coaxial bone biopsy device (image 8, series 8). The identical procedure was repeated yielding the acquisition of two additional core needle biopsy samples (images 10, series 13 and image 10, series 14). The needle was removed and superficial hemostasis was obtained with manual compression. A dressing was applied. The patient tolerated the procedure well without immediate post procedural complication. IMPRESSION: Successful CT guided  biopsy of lytic lesion involving the anterior aspect of the left ilium. Electronically Signed   By: Sandi Mariscal M.D.   On: 05/14/2021 11:52   DG Chest Port 1 View  Result Date: 05/28/2021 CLINICAL DATA:  Chest pain EXAM: PORTABLE CHEST 1 VIEW COMPARISON:  Chest x-ray 05/14/2021. FINDINGS: Right chest wall Port-A-Cath with tip overlying the superior cavoatrial junction. The heart size  and mediastinal contours are unchanged. Left base atelectasis. Vague opacity posterior to the Port-A-Cath overlying the right mid lung zone. No pulmonary edema. No pleural effusion. No pneumothorax. No acute osseous abnormality. Bilateral shoulder, right greater than left, degenerative changes. IMPRESSION: 1. Vague opacity posterior to the Port-A-Cath overlying the right mid lung zone. Finding likely related to Port-A-Cath. Recommend repeating PA and lateral view of chest. 2. Otherwise no acute active cardiopulmonary abnormality. Electronically Signed   By: Iven Finn M.D.   On: 05/28/2021 18:53   IR IMAGING GUIDED PORT INSERTION  Result Date: 05/06/2021 INDICATION: 87 year old female with diffuse large B-cell lymphoma EXAM: IMPLANTED PORT A CATH PLACEMENT WITH ULTRASOUND AND FLUOROSCOPIC GUIDANCE MEDICATIONS: None ANESTHESIA/SEDATION: Versed 1 mg IV; Fentanyl 50 mcg IV; Moderate Sedation Time:  19 minutes The patient was continuously monitored during the procedure by the interventional radiology nurse under my direct supervision. FLUOROSCOPY TIME:  0 minutes, 30 seconds (2 mGy) COMPLICATIONS: None immediate. PROCEDURE: The right neck and chest was prepped with chlorhexidine, and draped in the usual sterile fashion using maximum barrier technique (cap and mask, sterile gown, sterile gloves, large sterile sheet, hand hygiene and cutaneous antiseptic). Local anesthesia was attained by infiltration with 1% lidocaine with epinephrine. Ultrasound demonstrated patency of the right internal jugular vein, and this was documented  with an image. Under real-time ultrasound guidance, this vein was accessed with a 21 gauge micropuncture needle and image documentation was performed. A small dermatotomy was made at the access site with an 11 scalpel. A 0.018" wire was advanced into the SVC and the access needle exchanged for a 70F micropuncture vascular sheath. The 0.018" wire was then removed and a 0.035" wire advanced into the IVC. An appropriate location for the subcutaneous reservoir was selected below the clavicle and an incision was made through the skin and underlying soft tissues. The subcutaneous tissues were then dissected using a combination of blunt and sharp surgical technique and a pocket was formed. A single lumen low-profile power injectable portacatheter was then tunneled through the subcutaneous tissues from the pocket to the dermatotomy and the port reservoir placed within the subcutaneous pocket. The venous access site was then serially dilated and a peel away vascular sheath placed over the wire. The wire was removed and the port catheter advanced into position under fluoroscopic guidance. The catheter tip is positioned in the superior cavoatrial junction. This was documented with a spot image. The portacatheter was then tested and found to flush and aspirate well. The port was flushed with saline followed by 100 units/mL heparinized saline. The pocket was then closed in two layers using first subdermal inverted interrupted absorbable sutures followed by a running subcuticular suture. The epidermis was then sealed with Dermabond. The dermatotomy at the venous access site was also closed with Dermabond. IMPRESSION: Successful placement of a right IJ approach Power Port with ultrasound and fluoroscopic guidance. The catheter is ready for use. Electronically Signed   By: Jacqulynn Cadet M.D.   On: 05/06/2021 15:42     All questions were answered. The patient knows to call the clinic with any problems, questions or  concerns. Labs reviewed, satisfactory to proceed.    Benay Pike, MD 06/03/2021 3:56 PM

## 2021-06-03 NOTE — Patient Instructions (Signed)
[facility]  Discharge Instructions: Thank you for choosing [facility] to provide your oncology and hematology care.   If you have a lab appointment with the [facility], please go directly to the [facility] and check in at the registration area.   Wear comfortable clothing and clothing appropriate for easy access to any Portacath or PICC line.   We strive to give you quality time with your provider. You may need to reschedule your appointment if you arrive late (15 or more minutes).  Arriving late affects you and other patients whose appointments are after yours.  Also, if you miss three or more appointments without notifying the office, you may be dismissed from the clinic at the provider's discretion.      For prescription refill requests, have your pharmacy contact our office and allow 72 hours for refills to be completed.    Today you received the following chemotherapy and/or immunotherapy agents : Rituxan, Vincristine, Adriamycin. Cytoxan      To help prevent nausea and vomiting after your treatment, we encourage you to take your nausea medication as directed.  BELOW ARE SYMPTOMS THAT SHOULD BE REPORTED IMMEDIATELY: *FEVER GREATER THAN 100.4 F (38 C) OR HIGHER *CHILLS OR SWEATING *NAUSEA AND VOMITING THAT IS NOT CONTROLLED WITH YOUR NAUSEA MEDICATION *UNUSUAL SHORTNESS OF BREATH *UNUSUAL BRUISING OR BLEEDING *URINARY PROBLEMS (pain or burning when urinating, or frequent urination) *BOWEL PROBLEMS (unusual diarrhea, constipation, pain near the anus) TENDERNESS IN MOUTH AND THROAT WITH OR WITHOUT PRESENCE OF ULCERS (sore throat, sores in mouth, or a toothache) UNUSUAL RASH, SWELLING OR PAIN  UNUSUAL VAGINAL DISCHARGE OR ITCHING   Items with * indicate a potential emergency and should be followed up as soon as possible or go to the Emergency Department if any problems should occur.  Please show the CHEMOTHERAPY ALERT CARD or  IMMUNOTHERAPY ALERT CARD at check-in to the Emergency Department and triage nurse.  Should you have questions after your visit or need to cancel or reschedule your appointment, please contact [facility]  Dept: [phone]  and follow the prompts.  Office hours are 8:00 a.m. to 4:30 p.m. Monday - Friday. Please note that voicemails left after 4:00 p.m. may not be returned until the following business day.  We are closed weekends and major holidays. You have access to a nurse at all times for urgent questions. Please call the main number to the clinic Dept: [phone] and follow the prompts.   For any non-urgent questions, you may also contact your provider using MyChart. We now offer e-Visits for anyone 19 and older to request care online for non-urgent symptoms. For details visit [email or web address].   Also download the MyChart app! Go to the app store, search "MyChart", open the app, select [facility], and log in with your MyChart username and password.  Due to Covid, a mask is required upon entering the hospital/clinic. If you do not have a mask, one will be given to you upon arrival. For doctor visits, patients may have 1 support person aged 83 or older with them. For treatment visits, patients cannot have anyone with them due to current Covid guidelines and our immunocompromised population.

## 2021-06-04 ENCOUNTER — Telehealth: Payer: Self-pay

## 2021-06-04 NOTE — Telephone Encounter (Signed)
Spoke with patient on updated mobile phone number regarding upcoming appointments on Friday. Patient aware of newly scheduled ultrasound appointment and injection appointment.  Patient provided with date, time, and location of each appointment. Patient verbalized an understanding and had no further questions.

## 2021-06-05 ENCOUNTER — Other Ambulatory Visit: Payer: Self-pay

## 2021-06-05 ENCOUNTER — Telehealth: Payer: Self-pay

## 2021-06-05 ENCOUNTER — Ambulatory Visit (HOSPITAL_COMMUNITY)
Admission: RE | Admit: 2021-06-05 | Discharge: 2021-06-05 | Disposition: A | Discharge status: Home / Self Care | Payer: Medicare HMO | Source: Ambulatory Visit | Attending: Hematology and Oncology | Admitting: Hematology and Oncology

## 2021-06-05 ENCOUNTER — Encounter: Payer: Self-pay | Admitting: Hematology and Oncology

## 2021-06-05 ENCOUNTER — Inpatient Hospital Stay: Payer: Medicare HMO

## 2021-06-05 VITALS — BP 124/71 | HR 72 | Temp 97.8°F | Resp 16

## 2021-06-05 DIAGNOSIS — C787 Secondary malignant neoplasm of liver and intrahepatic bile duct: Secondary | ICD-10-CM | POA: Diagnosis not present

## 2021-06-05 DIAGNOSIS — C8330 Diffuse large B-cell lymphoma, unspecified site: Secondary | ICD-10-CM | POA: Diagnosis not present

## 2021-06-05 DIAGNOSIS — I700 Atherosclerosis of aorta: Secondary | ICD-10-CM | POA: Diagnosis not present

## 2021-06-05 DIAGNOSIS — Z5112 Encounter for antineoplastic immunotherapy: Secondary | ICD-10-CM | POA: Diagnosis not present

## 2021-06-05 DIAGNOSIS — C8331 Diffuse large B-cell lymphoma, lymph nodes of head, face, and neck: Secondary | ICD-10-CM | POA: Diagnosis not present

## 2021-06-05 DIAGNOSIS — R5383 Other fatigue: Secondary | ICD-10-CM | POA: Diagnosis not present

## 2021-06-05 DIAGNOSIS — C7951 Secondary malignant neoplasm of bone: Secondary | ICD-10-CM | POA: Diagnosis not present

## 2021-06-05 DIAGNOSIS — Z5189 Encounter for other specified aftercare: Secondary | ICD-10-CM | POA: Diagnosis not present

## 2021-06-05 DIAGNOSIS — M7989 Other specified soft tissue disorders: Secondary | ICD-10-CM | POA: Diagnosis not present

## 2021-06-05 DIAGNOSIS — Z5111 Encounter for antineoplastic chemotherapy: Secondary | ICD-10-CM | POA: Diagnosis not present

## 2021-06-05 MED ORDER — APIXABAN 5 MG PO TABS: 5.0000 mg | ORAL_TABLET | Freq: Two times a day (BID) | ORAL | 0 refills | Status: DC

## 2021-06-05 MED ORDER — PEGFILGRASTIM-CBQV 6 MG/0.6ML ~~LOC~~ SOSY
6.0000 mg | PREFILLED_SYRINGE | Freq: Once | SUBCUTANEOUS | Status: AC
  Administered 2021-06-05: 6 mg via SUBCUTANEOUS

## 2021-06-05 MED ORDER — PEGFILGRASTIM-CBQV 6 MG/0.6ML ~~LOC~~ SOSY
PREFILLED_SYRINGE | SUBCUTANEOUS | Status: AC
  Filled 2021-06-05: qty 0.6

## 2021-06-05 NOTE — Progress Notes (Signed)
BLE venous duplex has been completed.  Preliminary findings given to Denny Peon, RN at Dr. Howell Pringle office.  Results can be found under chart review under CV PROC. 06/05/2021 10:13 AM [name] RVT, RDMS

## 2021-06-05 NOTE — Telephone Encounter (Signed)
Spoke with patient's son, Jenny Reichmann, after receiving call from vascular that patient's ultrasound was positive for R femoral DVT. Per Dr Chryl Heck, patient to start eliquis 5mg  BID. Patient's son informed of new medication and risks of bleeding while on anticoagulation. Verbalized understanding.

## 2021-06-05 NOTE — Progress Notes (Signed)
Rapid Infusion Rituximab Pharmacist Evaluation  [name] is a 87 y.o. female being treated with rituximab for DLBCL. This patient may be considered for RIR.   A pharmacist has verified the patient tolerated rituximab infusions per the [facility] standard infusion protocol without grade 3-4 infusion reactions. The treatment plan will be updated to reflect RIR if the patient qualifies per the checklist below:   Age > 4 years old Yes   Clinically significant cardiovascular disease No   Circulating lymphocyte count < 5000/uL prior to cycle two Yes  Lab Results  Component Value Date   LYMPHSABS 1.1 06/03/2021    Prior documented grade 3-4 infusion reaction to rituximab No   Prior documented grade 1-2 infusion reaction to rituximab (If YES, Pharmacist will confirm with Physician if patient is still a candidate for RIR) No   Previous rituximab infusion within the past 6 months Yes   Treatment Plan updated orders to reflect [name] does meet the criteria for Rapid Infusion Rituximab. This patient is going to be switched to rapid infusion rituximab. Dr. Chryl Heck is in agreement.   Kennith Center, Pharm.D., CPP 06/05/2021@10 :36 AM

## 2021-06-05 NOTE — Patient Instructions (Signed)
Pegfilgrastim injection What is this medication? PEGFILGRASTIM (PEG fil gra stim) is a Molesworth-acting granulocyte colony-stimulating factor that stimulates the growth of neutrophils, a type of white blood cell important in the body's fight against infection. It is used to reduce the incidence of fever and infection in patients with certain types of cancer who are receiving chemotherapy that affects the bone marrow, and to increase survival after being exposed to high doses of radiation. This medicine may be used for other purposes; ask your health care provider or pharmacist if you have questions. COMMON BRAND NAME(S): Fulphila, Neulasta, Nyvepria, UDENYCA, Ziextenzo What should I tell my care team before I take this medication? They need to know if you have any of these conditions: kidney disease latex allergy ongoing radiation therapy sickle cell disease skin reactions to acrylic adhesives (On-Body Injector only) an unusual or allergic reaction to pegfilgrastim, filgrastim, other medicines, foods, dyes, or preservatives pregnant or trying to get pregnant breast-feeding How should I use this medication? This medicine is for injection under the skin. If you get this medicine at home, you will be taught how to prepare and give the pre-filled syringe or how to use the On-body Injector. Refer to the patient Instructions for Use for detailed instructions. Use exactly as directed. Tell your healthcare provider immediately if you suspect that the On-body Injector may not have performed as intended or if you suspect the use of the On-body Injector resulted in a missed or partial dose. It is important that you put your used needles and syringes in a special sharps container. Do not put them in a trash can. If you do not have a sharps container, call your pharmacist or healthcare provider to get one. Talk to your pediatrician regarding the use of this medicine in children. While this drug may be prescribed for  selected conditions, precautions do apply. Overdosage: If you think you have taken too much of this medicine contact a poison control center or emergency room at once. NOTE: This medicine is only for you. Do not share this medicine with others. What if I miss a dose? It is important not to miss your dose. Call your doctor or health care professional if you miss your dose. If you miss a dose due to an On-body Injector failure or leakage, a new dose should be administered as soon as possible using a single prefilled syringe for manual use. What may interact with this medication? Interactions have not been studied. This list may not describe all possible interactions. Give your health care provider a list of all the medicines, herbs, non-prescription drugs, or dietary supplements you use. Also tell them if you smoke, drink alcohol, or use illegal drugs. Some items may interact with your medicine. What should I watch for while using this medication? Your condition will be monitored carefully while you are receiving this medicine. You may need blood work done while you are taking this medicine. Talk to your health care provider about your risk of cancer. You may be more at risk for certain types of cancer if you take this medicine. If you are going to need a MRI, CT scan, or other procedure, tell your doctor that you are using this medicine (On-Body Injector only). What side effects may I notice from receiving this medication? Side effects that you should report to your doctor or health care professional as soon as possible: allergic reactions (skin rash, itching or hives, swelling of the face, lips, or tongue) back pain dizziness fever pain,   redness, or irritation at site where injected pinpoint red spots on the skin red or dark-brown urine shortness of breath or breathing problems stomach or side pain, or pain at the shoulder swelling tiredness trouble passing urine or change in the amount of  urine unusual bruising or bleeding Side effects that usually do not require medical attention (report to your doctor or health care professional if they continue or are bothersome): bone pain muscle pain This list may not describe all possible side effects. Call your doctor for medical advice about side effects. You may report side effects to FDA at 1-800-FDA-1088. Where should I keep my medication? Keep out of the reach of children. If you are using this medicine at home, you will be instructed on how to store it. Throw away any unused medicine after the expiration date on the label. NOTE: This sheet is a summary. It may not cover all possible information. If you have questions about this medicine, talk to your doctor, pharmacist, or health care provider.  2022 Elsevier/Gold Standard (2020-12-05 11:54:14)  

## 2021-06-05 NOTE — Progress Notes (Signed)
FYI  Talked to Jenny Reichmann, son Discussed about the findings, need for eliquis Discussed increased risk of bleeding on blood thinners. Requested to take her to the hospital with any severe bleeding or intractable headaches. They are agreeable to starting her on Eliquis.

## 2021-06-19 ENCOUNTER — Other Ambulatory Visit (HOSPITAL_COMMUNITY): Payer: Self-pay

## 2021-06-19 DIAGNOSIS — R1310 Dysphagia, unspecified: Secondary | ICD-10-CM

## 2021-06-23 ENCOUNTER — Other Ambulatory Visit: Payer: Self-pay

## 2021-06-23 ENCOUNTER — Inpatient Hospital Stay: Payer: Medicare HMO | Attending: Hematology and Oncology

## 2021-06-23 ENCOUNTER — Inpatient Hospital Stay: Payer: Medicare HMO | Admitting: Hematology and Oncology

## 2021-06-23 ENCOUNTER — Encounter: Payer: Self-pay | Admitting: Hematology and Oncology

## 2021-06-23 ENCOUNTER — Inpatient Hospital Stay: Payer: Medicare HMO

## 2021-06-23 ENCOUNTER — Other Ambulatory Visit: Payer: Self-pay | Admitting: Hematology and Oncology

## 2021-06-23 VITALS — BP 133/72 | HR 80 | Temp 97.6°F | Resp 18

## 2021-06-23 DIAGNOSIS — Z5111 Encounter for antineoplastic chemotherapy: Secondary | ICD-10-CM | POA: Insufficient documentation

## 2021-06-23 DIAGNOSIS — Z5189 Encounter for other specified aftercare: Secondary | ICD-10-CM | POA: Insufficient documentation

## 2021-06-23 DIAGNOSIS — C8330 Diffuse large B-cell lymphoma, unspecified site: Secondary | ICD-10-CM

## 2021-06-23 DIAGNOSIS — C8331 Diffuse large B-cell lymphoma, lymph nodes of head, face, and neck: Secondary | ICD-10-CM | POA: Diagnosis not present

## 2021-06-23 DIAGNOSIS — Z5112 Encounter for antineoplastic immunotherapy: Secondary | ICD-10-CM | POA: Insufficient documentation

## 2021-06-23 DIAGNOSIS — Z7901 Long term (current) use of anticoagulants: Secondary | ICD-10-CM | POA: Insufficient documentation

## 2021-06-23 DIAGNOSIS — D649 Anemia, unspecified: Secondary | ICD-10-CM | POA: Insufficient documentation

## 2021-06-23 DIAGNOSIS — Z79899 Other long term (current) drug therapy: Secondary | ICD-10-CM | POA: Insufficient documentation

## 2021-06-23 DIAGNOSIS — I10 Essential (primary) hypertension: Secondary | ICD-10-CM | POA: Insufficient documentation

## 2021-06-23 LAB — CMP (CANCER CENTER ONLY)
ALT: 21 U/L (ref 0–44)
AST: 22 U/L (ref 15–41)
Albumin: 3.7 g/dL (ref 3.5–5.0)
Alkaline Phosphatase: 160 U/L — ABNORMAL HIGH (ref 38–126)
Anion gap: 10 (ref 5–15)
BUN: 14 mg/dL (ref 8–23)
CO2: 26 mmol/L (ref 22–32)
Calcium: 8.9 mg/dL (ref 8.9–10.3)
Chloride: 105 mmol/L (ref 98–111)
Creatinine: 0.42 mg/dL — ABNORMAL LOW (ref 0.44–1.00)
GFR, Estimated: >60 mL/min (ref >60)
Glucose, Bld: 78 mg/dL (ref 70–99)
Potassium: 3.8 mmol/L (ref 3.5–5.1)
Sodium: 141 mmol/L (ref 135–145)
Total Bilirubin: 0.5 mg/dL (ref 0.3–1.2)
Total Protein: 6.9 g/dL (ref 6.5–8.1)

## 2021-06-23 LAB — CBC WITH DIFFERENTIAL (CANCER CENTER ONLY)
Abs Immature Granulocytes: 0.05 10*3/uL (ref 0.00–0.07)
Basophils Absolute: 0 10*3/uL (ref 0.0–0.1)
Basophils Relative: 1 %
Eosinophils Absolute: 0.1 10*3/uL (ref 0.0–0.5)
Eosinophils Relative: 1 %
HCT: 28.5 % — ABNORMAL LOW (ref 36.0–46.0)
Hemoglobin: 9 g/dL — ABNORMAL LOW (ref 12.0–15.0)
Immature Granulocytes: 1 %
Lymphocytes Relative: 20 %
Lymphs Abs: 1.6 10*3/uL (ref 0.7–4.0)
MCH: 23 pg — ABNORMAL LOW (ref 26.0–34.0)
MCHC: 31.6 g/dL (ref 30.0–36.0)
MCV: 72.7 fL — ABNORMAL LOW (ref 80.0–100.0)
Monocytes Absolute: 0.7 10*3/uL (ref 0.1–1.0)
Monocytes Relative: 8 %
Neutro Abs: 5.7 10*3/uL (ref 1.7–7.7)
Neutrophils Relative %: 69 %
Platelet Count: 240 10*3/uL (ref 150–400)
RBC: 3.92 MIL/uL (ref 3.87–5.11)
RDW: 22.9 % — ABNORMAL HIGH (ref 11.5–15.5)
WBC Count: 8.1 10*3/uL (ref 4.0–10.5)
nRBC: 0 % (ref 0.0–0.2)

## 2021-06-23 MED ORDER — SODIUM CHLORIDE 0.9 % IV SOLN
375.0000 mg/m2 | Freq: Once | INTRAVENOUS | Status: AC
  Administered 2021-06-23: 500 mg via INTRAVENOUS
  Filled 2021-06-23: qty 50

## 2021-06-23 MED ORDER — DIPHENHYDRAMINE HCL 25 MG PO CAPS
50.0000 mg | ORAL_CAPSULE | Freq: Once | ORAL | Status: AC
  Administered 2021-06-23: 50 mg via ORAL

## 2021-06-23 MED ORDER — ACETAMINOPHEN 325 MG PO TABS
ORAL_TABLET | ORAL | Status: AC
  Filled 2021-06-23: qty 2

## 2021-06-23 MED ORDER — ACETAMINOPHEN 325 MG PO TABS
650.0000 mg | ORAL_TABLET | Freq: Once | ORAL | Status: AC
  Administered 2021-06-23: 650 mg via ORAL

## 2021-06-23 MED ORDER — SODIUM CHLORIDE 0.9 % IV SOLN
150.0000 mg | Freq: Once | INTRAVENOUS | Status: AC
  Administered 2021-06-23: 150 mg via INTRAVENOUS
  Filled 2021-06-23: qty 150

## 2021-06-23 MED ORDER — DIPHENHYDRAMINE HCL 25 MG PO CAPS
ORAL_CAPSULE | ORAL | Status: AC
  Filled 2021-06-23: qty 2

## 2021-06-23 MED ORDER — DOXORUBICIN HCL CHEMO IV INJECTION 2 MG/ML
25.0000 mg/m2 | Freq: Once | INTRAVENOUS | Status: AC
  Administered 2021-06-23: 36 mg via INTRAVENOUS
  Filled 2021-06-23: qty 18

## 2021-06-23 MED ORDER — SODIUM CHLORIDE 0.9 % IV SOLN
400.0000 mg/m2 | Freq: Once | INTRAVENOUS | Status: AC
  Administered 2021-06-23: 560 mg via INTRAVENOUS
  Filled 2021-06-23: qty 28

## 2021-06-23 MED ORDER — SODIUM CHLORIDE 0.9 % IV SOLN
Freq: Once | INTRAVENOUS | Status: AC
  Filled 2021-06-23: qty 250

## 2021-06-23 MED ORDER — PALONOSETRON HCL INJECTION 0.25 MG/5ML
INTRAVENOUS | Status: AC
  Filled 2021-06-23: qty 5

## 2021-06-23 MED ORDER — SODIUM CHLORIDE 0.9 % IV SOLN
10.0000 mg | Freq: Once | INTRAVENOUS | Status: AC
  Administered 2021-06-23: 10 mg via INTRAVENOUS
  Filled 2021-06-23: qty 10

## 2021-06-23 MED ORDER — VINCRISTINE SULFATE CHEMO INJECTION 1 MG/ML
1.0000 mg | Freq: Once | INTRAVENOUS | Status: AC
  Administered 2021-06-23: 1 mg via INTRAVENOUS
  Filled 2021-06-23: qty 1

## 2021-06-23 MED ORDER — SODIUM CHLORIDE 0.9% FLUSH
10.0000 mL | INTRAVENOUS | Status: DC | PRN
  Administered 2021-06-23: 10 mL
  Filled 2021-06-23: qty 10

## 2021-06-23 MED ORDER — PALONOSETRON HCL INJECTION 0.25 MG/5ML
0.2500 mg | Freq: Once | INTRAVENOUS | Status: AC
  Administered 2021-06-23: 0.25 mg via INTRAVENOUS

## 2021-06-23 MED ORDER — HEPARIN SOD (PORK) LOCK FLUSH 100 UNIT/ML IV SOLN
500.0000 [IU] | Freq: Once | INTRAVENOUS | Status: AC | PRN
  Administered 2021-06-23: 500 [IU]
  Filled 2021-06-23: qty 5

## 2021-06-23 NOTE — Patient Instructions (Signed)
[facility]   Discharge Instructions: Thank you for choosing [facility] to provide your oncology and hematology care.   If you have a lab appointment with the [facility], please go directly to the [facility] and check in at the registration area.   Wear comfortable clothing and clothing appropriate for easy access to any Portacath or PICC line.   We strive to give you quality time with your provider. You may need to reschedule your appointment if you arrive late (15 or more minutes).  Arriving late affects you and other patients whose appointments are after yours.  Also, if you miss three or more appointments without notifying the office, you may be dismissed from the clinic at the provider's discretion.      For prescription refill requests, have your pharmacy contact our office and allow 72 hours for refills to be completed.    Today you received the following chemotherapy and/or immunotherapy agents: Doxorubicin (Adriamycin), Vincristine, Cyclophosphamide (Cytoxan), and Rituximab (Rituxan)      To help prevent nausea and vomiting after your treatment, we encourage you to take your nausea medication as directed.  BELOW ARE SYMPTOMS THAT SHOULD BE REPORTED IMMEDIATELY: *FEVER GREATER THAN 100.4 F (38 C) OR HIGHER *CHILLS OR SWEATING *NAUSEA AND VOMITING THAT IS NOT CONTROLLED WITH YOUR NAUSEA MEDICATION *UNUSUAL SHORTNESS OF BREATH *UNUSUAL BRUISING OR BLEEDING *URINARY PROBLEMS (pain or burning when urinating, or frequent urination) *BOWEL PROBLEMS (unusual diarrhea, constipation, pain near the anus) TENDERNESS IN MOUTH AND THROAT WITH OR WITHOUT PRESENCE OF ULCERS (sore throat, sores in mouth, or a toothache) UNUSUAL RASH, SWELLING OR PAIN  UNUSUAL VAGINAL DISCHARGE OR ITCHING   Items with * indicate a potential emergency and should be followed up as soon as possible or go to the Emergency Department if any problems should  occur.  Please show the CHEMOTHERAPY ALERT CARD or IMMUNOTHERAPY ALERT CARD at check-in to the Emergency Department and triage nurse.  Should you have questions after your visit or need to cancel or reschedule your appointment, please contact [facility]  Dept: [phone]  and follow the prompts.  Office hours are 8:00 a.m. to 4:30 p.m. Monday - Friday. Please note that voicemails left after 4:00 p.m. may not be returned until the following business day.  We are closed weekends and major holidays. You have access to a nurse at all times for urgent questions. Please call the main number to the clinic Dept: [phone] and follow the prompts.   For any non-urgent questions, you may also contact your provider using MyChart. We now offer e-Visits for anyone 55 and older to request care online for non-urgent symptoms. For details visit [email or web address].   Also download the MyChart app! Go to the app store, search "MyChart", open the app, select [facility], and log in with your MyChart username and password.  Due to Covid, a mask is required upon entering the hospital/clinic. If you do not have a mask, one will be given to you upon arrival. For doctor visits, patients may have 1 support person aged 40 or older with them. For treatment visits, patients cannot have anyone with them due to current Covid guidelines and our immunocompromised population.

## 2021-06-23 NOTE — Progress Notes (Signed)
[place] NOTE  Patient Care Team: Antony Contras, MD as PCP - General (Family Medicine)  CHIEF COMPLAINTS/PURPOSE OF CONSULTATION:  DLBCL of nasopharynx.  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  After last treatment, no major toxicities to report.  Her bilateral lower extremity swelling has also significantly improved, her weight is improving. She looks well to proceed with cycle 2 as planned today. Labs reviewed and okay to proceed. I have recommended that they stay on time for their follow-up visits so we do not delay infusion.  Son expressed understanding and was apologetic. She will be seen for follow-up as scheduled.  They were strongly encouraged to contact us with any interim questions or concerns.  Normocytic normochromic anemia Normocytic normochromic anemia, likely related to underlying disease or bone marrow involvement by lymphoma. This is currently stable or better.  No indication for transfusion.  We will continue to monitor.  No orders of the defined types were placed in this encounter.  HISTORY OF PRESENTING ILLNESS:   [name] 87 y.o. female is here because of new diagnosis of DLBCL of nasopharynx.  [name] is a very pleasant 87 year old female patient who arrived today with her granddaughter [name] to the appointment.  She does not quite remember things as explained and has short-term memory loss.  After a bit of the conversation, she did admit to me that she was told about a cancer in her nose which was biopsied last week but she cannot remember the details.  Ms. [name] mentioned that for the past year or so grandma has been having more nasal drainage and has  progressively lost weight in the past couple months of at least 20 pounds, has become relatively more sedentary and inactive, has no appetite.  They initially thought she might be having a stroke when she was not getting out of bed for 3days and has not been eating.  When she was in the ER.  She had some imaging CT head without contrast which showed left maxillary and ethmoid sinus disease. She then had MRI brain on the same day which showed 3.2 x 2 x 3.5 cm heterogeneous mass positioned at the left nasopharynx near the fossa of Rosenmuller.  Finding is most concerning for a primary nasopharyngeal neoplasm.  ENT consultation was recommended.  She then went on to see [name].She had nasal endoscopy with biopsy of left nasopharyngeal mass.  According to Dr. Roseanna Rainbow, polypoid mass lesion was noted to be lymphoma from the posterior aspect of the left middle turbinate.  Pathology showed findings consistent with diffuse large B-cell lymphoma of the left nasopharyngeal mass as well as left middle turbinate.   PET/CT showed hypermetabolic left nasopharyngeal mass, no associated neck adenopathy, 2 large hypermetabolic liver lesions, destructive lytic metastatic bone lesions.  Since there was no lymphadenopathy and no unusual appearance of spleen, second biopsy was recommended to confirm the diagnosis.  Left ilium  Biopsy showed findings consistent with involvement by non-Hodgkin B-cell lymphoma.  Features are similar to previously known large B-cell lymphoma consistent with involvement by the same disease process.  Cycle day 1 of R mini CHOP on 06/03/2021.  Interval History  She is here for  follow-up with her son.  She arrived late to the lab appointment and hence her appointment was also significantly delayed in the infusion.  According to patient, she did very well with the last chemotherapy.  She occasionally feels dizzy when she stands up but otherwise denies any nausea vomiting or diarrhea.  No  other adverse effects from chemotherapy reported.  She is eating well, gained a couple pounds.  Bilateral lower extremity swelling has significantly improved.  She is taking blood thinners as prescribed.  Son was present at the time of the appointment and he also admits that she did really well with the last cycle of chemotherapy.  She is wearing compression stockings today.  No falls recently. Rest of the ROS reviewed and negative  MEDICAL HISTORY:  Past Medical History:  Diagnosis Date   Anemia    Arthritis    knees    GERD (gastroesophageal reflux disease)    Hiatal hernia    History of inguinal hernia repair    BIH   Hyperlipidemia    Hypertension    Memory problem     SURGICAL HISTORY: Past Surgical History:  Procedure Laterality Date   COLONOSCOPY     HERNIA REPAIR  10/13/11   lap BIH rep w/ mesh- Dr. Tora Kindred HERNIA REPAIR  10/13/2011   Procedure: LAPAROSCOPIC INGUINAL HERNIA;  Surgeon: Adin Hector, MD;  Location: [facility];  Service: General;  Laterality: Bilateral;  Laparoscopic bilateral inguinal hernia repair, converted to open bilateral inguinal hernia repairs with mesh   IR IMAGING GUIDED PORT INSERTION  05/06/2021   NASAL ENDOSCOPY Left 04/08/2021   Procedure: NASAL ENDOSCOPY WITH BIOPSY OF LEFT NASOPPHARYNGEAL MASS;  Surgeon: Jason Coop, DO;  Location: [facility];  Service: ENT;  Laterality: Left;  Frozen Section    SOCIAL HISTORY: Social History   Socioeconomic History   Marital status: Single    Spouse name: Not on file   Number of children: Not on file   Years of education: Not on file   Highest education level: Not on file  Occupational History   Not on file  Tobacco Use   Smoking status: Never   Smokeless tobacco: Never  Vaping Use   Vaping Use: Never used  Substance and Sexual Activity   Alcohol use: No   Drug use: No   Sexual activity: Not on file  Other Topics Concern   Not on file  Social History Narrative   Not on file    Social Determinants of Health   Financial Resource Strain: Not on file  Food Insecurity: Not on file  Transportation Needs: Not on file  Physical Activity: Not on file  Stress: Not on file  Social Connections: Not on file  Intimate Partner Violence: Not on file    FAMILY HISTORY: Family History  Problem Relation Age of Onset   Heart disease Mother     ALLERGIES:  has No Known Allergies.  MEDICATIONS:  Current Outpatient Medications  Medication Sig Dispense Refill   acetaminophen (TYLENOL) 500 MG tablet Take 1,000 mg by mouth every 6 (six) hours as needed for mild pain or fever.     allopurinol (ZYLOPRIM) 300 MG tablet Take 1 tablet (300 mg total) by mouth daily. 30 tablet 0   amLODipine (NORVASC) 10 MG tablet Take 10 mg by mouth daily.     apixaban (ELIQUIS) 5 MG TABS tablet Take 1 tablet (5 mg total) by mouth 2 (two) times daily. 60 tablet 0  atorvastatin (LIPITOR) 80 MG tablet Take 80 mg by mouth every morning.     ezetimibe (ZETIA) 10 MG tablet Take 10 mg by mouth every morning.     HYDROcodone-acetaminophen (NORCO/VICODIN) 5-325 MG tablet Take 1 tablet by mouth every 4 (four) hours as needed. 15 tablet 0   lidocaine-prilocaine (EMLA) cream Apply to affected area once 30 g 3   LORazepam (ATIVAN) 0.5 MG tablet Take 1 tablet (0.5 mg total) by mouth every 6 (six) hours as needed (Nausea or vomiting). 30 tablet 0   ondansetron (ZOFRAN) 8 MG tablet Take 1 tablet (8 mg total) by mouth 2 (two) times daily as needed for refractory nausea / vomiting. Start on day 3 after cyclophosphamide chemotherapy. 30 tablet 1   predniSONE (DELTASONE) 20 MG tablet Take 3 tablets (60 mg total) by mouth daily. Take with food on days 1-5 of chemotherapy. 15 tablet 5   prochlorperazine (COMPAZINE) 10 MG tablet Take 1 tablet (10 mg total) by mouth every 6 (six) hours as needed (Nausea or vomiting). 30 tablet 6   triamterene-hydrochlorothiazide (MAXZIDE-25) 37.5-25 MG tablet Take 1 tablet by mouth every  morning.     No current facility-administered medications for this visit.   Facility-Administered Medications Ordered in Other Visits  Medication Dose Route Frequency Provider Last Rate Last Admin   cyclophosphamide (CYTOXAN) 560 mg in sodium chloride 0.9 % 250 mL chemo infusion  400 mg/m2 (Treatment Plan Recorded) Intravenous Once Tymeshia Awan, Arletha Pili, MD       DOXOrubicin (ADRIAMYCIN) chemo injection 36 mg  25 mg/m2 (Treatment Plan Recorded) Intravenous Once Manilla Strieter, Arletha Pili, MD       heparin lock flush 100 unit/mL  500 Units Intracatheter Once PRN Ahja Martello, Arletha Pili, MD       riTUXimab-pvvr (RUXIENCE) 500 mg in sodium chloride 0.9 % 200 mL infusion  375 mg/m2 (Treatment Plan Recorded) Intravenous Once Marchia Diguglielmo, MD       sodium chloride flush (NS) 0.9 % injection 10 mL  10 mL Intracatheter PRN Christionna Poland, MD       vinCRIStine (ONCOVIN) 1 mg in sodium chloride 0.9 % 50 mL chemo infusion  1 mg Intravenous Once Maisy Newport, Arletha Pili, MD        PHYSICAL EXAMINATION:  ECOG PERFORMANCE STATUS: 1 - Symptomatic but completely ambulatory   Physical Exam Constitutional:      Appearance: Normal appearance.  HENT:     Head: Normocephalic and atraumatic.  Eyes:     Pupils: Pupils are equal, round, and reactive to light.  Cardiovascular:     Rate and Rhythm: Normal rate and regular rhythm.     Pulses: Normal pulses.     Heart sounds: Normal heart sounds.  Pulmonary:     Effort: Pulmonary effort is normal.     Breath sounds: Normal breath sounds.  Abdominal:     General: Abdomen is flat. Bowel sounds are normal.     Palpations: Abdomen is soft.  Musculoskeletal:        General: Swelling (Bilateral lower extremity swelling significantly improved compared to last visit, patient currently in compression stockings) present. No tenderness.     Cervical back: Normal range of motion and neck supple. No rigidity.  Lymphadenopathy:     Cervical: No cervical adenopathy.  Skin:    General: Skin is  warm and dry.  Neurological:     General: No focal deficit present.     Mental Status: She is alert.  Psychiatric:        Mood and Affect:  Mood normal.    2+ BLE  noted bilaterally    LABORATORY DATA:  I have reviewed the data as listed Lab Results  Component Value Date   WBC 8.1 06/23/2021   HGB 9.0 (L) 06/23/2021   HCT 28.5 (L) 06/23/2021   MCV 72.7 (L) 06/23/2021   PLT 240 06/23/2021     Chemistry      Component Value Date/Time   NA 141 06/23/2021 1007   K 3.8 06/23/2021 1007   CL 105 06/23/2021 1007   CO2 26 06/23/2021 1007   BUN 14 06/23/2021 1007   CREATININE 0.42 (L) 06/23/2021 1007      Component Value Date/Time   CALCIUM 8.9 06/23/2021 1007   ALKPHOS 160 (H) 06/23/2021 1007   AST 22 06/23/2021 1007   ALT 21 06/23/2021 1007   BILITOT 0.5 06/23/2021 1007      RADIOGRAPHIC STUDIES: I have personally reviewed the radiological images as listed and agreed with the findings in the report. CT Angio Chest PE W and/or Wo Contrast  Result Date: 05/28/2021 CLINICAL DATA:  Chest pain.  Lymphoma. EXAM: CT ANGIOGRAPHY CHEST WITH CONTRAST TECHNIQUE: Multidetector CT imaging of the chest was performed using the standard protocol during bolus administration of intravenous contrast. Multiplanar CT image reconstructions and MIPs were obtained to evaluate the vascular anatomy. CONTRAST:  64m OMNIPAQUE IOHEXOL 350 MG/ML SOLN COMPARISON:  Chest radiograph dated 05/28/2021. CT abdomen/pelvis dated 05/15/2021. PET-CT dated 04/28/2021. FINDINGS: Cardiovascular: Satisfactory opacification the bilateral pulmonary arteries to the segmental level. Evaluation is mildly constrained by respiratory motion. Within that constraint, there is no evidence of pulmonary embolism. Although not tailored for evaluation of the thoracic aorta, there is no evidence thoracic aortic aneurysm or dissection. Atherosclerotic calcifications of the aortic arch. The heart is normal in size. No pericardial effusion.  Right chest port terminates at the cavoatrial junction. Three vessel coronary atherosclerosis. Mediastinum/Nodes: No suspicious mediastinal lymphadenopathy. Visualized thyroid is unremarkable. Lungs/Pleura: No suspicious pulmonary nodules. Mild scarring/atelectasis in the bilateral lower lobes. No focal consolidation. No pleural effusion or pneumothorax. Upper Abdomen: Visualized upper abdomen is notable for a dominant 5.3 cm hepatic metastasis (series 4/image 102). Additional hepatic metastases are better evaluated on recent CT abdomen/pelvis. Musculoskeletal: Lytic lesion in the left sternum/manubrium (series 4/image 36). Lytic rib lesions, for example the left lateral 7th rib (series 4/image 73). Lytic lesion in the left glenoid (series 4/image 15). These findings are better evaluated on recent PET-CT. Review of the MIP images confirms the above findings. IMPRESSION: No evidence of pulmonary embolism. Hepatic metastases, incompletely visualized, better evaluated on recent CT abdomen/pelvis. Multifocal osseous metastases, better evaluated on recent PET-CT. Aortic Atherosclerosis (ICD10-I70.0). Electronically Signed   By: SJulian HyM.D.   On: 05/28/2021 21:19   DG Chest Port 1 View  Result Date: 05/28/2021 CLINICAL DATA:  Chest pain EXAM: PORTABLE CHEST 1 VIEW COMPARISON:  Chest x-ray 05/14/2021. FINDINGS: Right chest wall Port-A-Cath with tip overlying the superior cavoatrial junction. The heart size and mediastinal contours are unchanged. Left base atelectasis. Vague opacity posterior to the Port-A-Cath overlying the right mid lung zone. No pulmonary edema. No pleural effusion. No pneumothorax. No acute osseous abnormality. Bilateral shoulder, right greater than left, degenerative changes. IMPRESSION: 1. Vague opacity posterior to the Port-A-Cath overlying the right mid lung zone. Finding likely related to Port-A-Cath. Recommend repeating PA and lateral view of chest. 2. Otherwise no acute active  cardiopulmonary abnormality. Electronically Signed   By: MIven FinnM.D.   On: 05/28/2021 18:53  VAS [place] LOWER EXTREMITY VENOUS (DVT)  Result Date: 06/05/2021  Lower Venous DVT Study Patient Name:  [name]  Date of Exam:   06/05/2021 Medical Rec #: [id number]    Accession #:    [id number] Date of Birth: 1932/02/11    Patient Gender: F Patient Age:   89Y Exam Location:  [facility] Procedure:      VAS [place] LOWER EXTREMITY VENOUS (DVT) Referring Phys: [id number] Shaletha Humble --------------------------------------------------------------------------------  Indications: Edema.  Risk Factors: Cancer Recent DLBCL diagnosis. Limitations: Extensive BLE subcutaneous edema. Comparison Study: No previous exams Performing Technologist: Jody Hill RVT, RDMS  Examination Guidelines: A complete evaluation includes B-mode imaging, spectral Doppler, color Doppler, and power Doppler as needed of all accessible portions of each vessel. Bilateral testing is considered an integral part of a complete examination. Limited examinations for reoccurring indications may be performed as noted. The reflux portion of the exam is performed with the patient in reverse Trendelenburg.  +---------+---------------+---------+-----------+----------+--------------+ RIGHT    CompressibilityPhasicitySpontaneityPropertiesThrombus Aging +---------+---------------+---------+-----------+----------+--------------+ CFV      Full           Yes      Yes                                 +---------+---------------+---------+-----------+----------+--------------+ SFJ      Full                                                        +---------+---------------+---------+-----------+----------+--------------+ FV Prox  Full           Yes      Yes                                 +---------+---------------+---------+-----------+----------+--------------+ FV Mid   Partial        No       No                   Acute           +---------+---------------+---------+-----------+----------+--------------+ FV DistalPartial        No       No                   Acute          +---------+---------------+---------+-----------+----------+--------------+ PFV      Full                                                        +---------+---------------+---------+-----------+----------+--------------+ POP      Full           Yes      Yes                                 +---------+---------------+---------+-----------+----------+--------------+ PTV      Full                                                        +---------+---------------+---------+-----------+----------+--------------+  PERO     Full                                                        +---------+---------------+---------+-----------+----------+--------------+   +---------+---------------+---------+-----------+----------+--------------+ LEFT     CompressibilityPhasicitySpontaneityPropertiesThrombus Aging +---------+---------------+---------+-----------+----------+--------------+ CFV      Full           Yes      Yes                                 +---------+---------------+---------+-----------+----------+--------------+ SFJ      Full                                                        +---------+---------------+---------+-----------+----------+--------------+ FV Prox  Full           Yes      Yes                                 +---------+---------------+---------+-----------+----------+--------------+ FV Mid   Full           Yes      Yes                                 +---------+---------------+---------+-----------+----------+--------------+ FV DistalFull           Yes      Yes                                 +---------+---------------+---------+-----------+----------+--------------+ PFV      Full                                                         +---------+---------------+---------+-----------+----------+--------------+ POP      Full           Yes      Yes                                 +---------+---------------+---------+-----------+----------+--------------+ PTV      Full                                                        +---------+---------------+---------+-----------+----------+--------------+ PERO     Full                                                        +---------+---------------+---------+-----------+----------+--------------+  Summary: BILATERAL: - No evidence of superficial venous thrombosis in the lower extremities, bilaterally. -No evidence of popliteal cyst, bilaterally. -Extensive subcutaneous edema in BLE. RIGHT: - Findings consistent with acute deep vein thrombosis involving the right femoral vein.  LEFT: - There is no evidence of deep vein thrombosis in the lower extremity.  *See table(s) above for measurements and observations. Electronically signed by Monica Martinez MD on 06/05/2021 at 2:45:09 PM.    Final      All questions were answered. The patient knows to call the clinic with any problems, questions or concerns. I spent 30 minutes in the care of this patient including reviewing plan of care, labs, discussion about follow-up and treatment plan.  Benay Pike, MD 06/23/2021 12:41 PM

## 2021-06-23 NOTE — Assessment & Plan Note (Signed)
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  After last treatment, no major toxicities to report.  Her bilateral lower extremity swelling has also significantly improved, her weight is improving. She looks well to proceed with cycle 2 as planned today. Labs reviewed and okay to proceed. I have recommended that they stay on time for their follow-up visits so we do not delay infusion.  Son expressed understanding and was apologetic. She will be seen for follow-up as scheduled.  They were strongly encouraged to contact us with any interim questions or concerns.

## 2021-06-23 NOTE — Assessment & Plan Note (Signed)
Normocytic normochromic anemia, likely related to underlying disease or bone marrow involvement by lymphoma. This is currently stable or better.  No indication for transfusion.  We will continue to monitor.

## 2021-06-25 ENCOUNTER — Other Ambulatory Visit: Payer: Self-pay

## 2021-06-25 ENCOUNTER — Inpatient Hospital Stay: Payer: Medicare HMO

## 2021-06-25 VITALS — BP 129/60 | HR 79 | Temp 98.9°F | Resp 16

## 2021-06-25 DIAGNOSIS — Z5112 Encounter for antineoplastic immunotherapy: Secondary | ICD-10-CM | POA: Diagnosis not present

## 2021-06-25 DIAGNOSIS — Z5189 Encounter for other specified aftercare: Secondary | ICD-10-CM | POA: Diagnosis not present

## 2021-06-25 DIAGNOSIS — Z79899 Other long term (current) drug therapy: Secondary | ICD-10-CM | POA: Diagnosis not present

## 2021-06-25 DIAGNOSIS — Z5111 Encounter for antineoplastic chemotherapy: Secondary | ICD-10-CM | POA: Diagnosis not present

## 2021-06-25 DIAGNOSIS — C8330 Diffuse large B-cell lymphoma, unspecified site: Secondary | ICD-10-CM

## 2021-06-25 DIAGNOSIS — C8331 Diffuse large B-cell lymphoma, lymph nodes of head, face, and neck: Secondary | ICD-10-CM | POA: Diagnosis not present

## 2021-06-25 DIAGNOSIS — I10 Essential (primary) hypertension: Secondary | ICD-10-CM | POA: Diagnosis not present

## 2021-06-25 DIAGNOSIS — Z7901 Long term (current) use of anticoagulants: Secondary | ICD-10-CM | POA: Diagnosis not present

## 2021-06-25 DIAGNOSIS — D649 Anemia, unspecified: Secondary | ICD-10-CM | POA: Diagnosis not present

## 2021-06-25 MED ORDER — PEGFILGRASTIM-CBQV 6 MG/0.6ML ~~LOC~~ SOSY
6.0000 mg | PREFILLED_SYRINGE | Freq: Once | SUBCUTANEOUS | Status: AC
  Administered 2021-06-25: 6 mg via SUBCUTANEOUS

## 2021-06-25 MED ORDER — PEGFILGRASTIM-CBQV 6 MG/0.6ML ~~LOC~~ SOSY
PREFILLED_SYRINGE | SUBCUTANEOUS | Status: AC
  Filled 2021-06-25: qty 0.6

## 2021-06-25 NOTE — Patient Instructions (Signed)
Pegfilgrastim injection What is this medication? PEGFILGRASTIM (PEG fil gra stim) is a Mcdowell-acting granulocyte colony-stimulating factor that stimulates the growth of neutrophils, a type of white blood cell important in the body's fight against infection. It is used to reduce the incidence of fever and infection in patients with certain types of cancer who are receiving chemotherapy that affects the bone marrow, and to increase survival after being exposed to high doses of radiation. This medicine may be used for other purposes; ask your health care provider or pharmacist if you have questions. COMMON BRAND NAME(S): Fulphila, Neulasta, Nyvepria, UDENYCA, Ziextenzo What should I tell my care team before I take this medication? They need to know if you have any of these conditions: kidney disease latex allergy ongoing radiation therapy sickle cell disease skin reactions to acrylic adhesives (On-Body Injector only) an unusual or allergic reaction to pegfilgrastim, filgrastim, other medicines, foods, dyes, or preservatives pregnant or trying to get pregnant breast-feeding How should I use this medication? This medicine is for injection under the skin. If you get this medicine at home, you will be taught how to prepare and give the pre-filled syringe or how to use the On-body Injector. Refer to the patient Instructions for Use for detailed instructions. Use exactly as directed. Tell your healthcare provider immediately if you suspect that the On-body Injector may not have performed as intended or if you suspect the use of the On-body Injector resulted in a missed or partial dose. It is important that you put your used needles and syringes in a special sharps container. Do not put them in a trash can. If you do not have a sharps container, call your pharmacist or healthcare provider to get one. Talk to your pediatrician regarding the use of this medicine in children. While this drug may be prescribed for  selected conditions, precautions do apply. Overdosage: If you think you have taken too much of this medicine contact a poison control center or emergency room at once. NOTE: This medicine is only for you. Do not share this medicine with others. What if I miss a dose? It is important not to miss your dose. Call your doctor or health care professional if you miss your dose. If you miss a dose due to an On-body Injector failure or leakage, a new dose should be administered as soon as possible using a single prefilled syringe for manual use. What may interact with this medication? Interactions have not been studied. This list may not describe all possible interactions. Give your health care provider a list of all the medicines, herbs, non-prescription drugs, or dietary supplements you use. Also tell them if you smoke, drink alcohol, or use illegal drugs. Some items may interact with your medicine. What should I watch for while using this medication? Your condition will be monitored carefully while you are receiving this medicine. You may need blood work done while you are taking this medicine. Talk to your health care provider about your risk of cancer. You may be more at risk for certain types of cancer if you take this medicine. If you are going to need a MRI, CT scan, or other procedure, tell your doctor that you are using this medicine (On-Body Injector only). What side effects may I notice from receiving this medication? Side effects that you should report to your doctor or health care professional as soon as possible: allergic reactions (skin rash, itching or hives, swelling of the face, lips, or tongue) back pain dizziness fever pain,   redness, or irritation at site where injected pinpoint red spots on the skin red or dark-brown urine shortness of breath or breathing problems stomach or side pain, or pain at the shoulder swelling tiredness trouble passing urine or change in the amount of  urine unusual bruising or bleeding Side effects that usually do not require medical attention (report to your doctor or health care professional if they continue or are bothersome): bone pain muscle pain This list may not describe all possible side effects. Call your doctor for medical advice about side effects. You may report side effects to FDA at 1-800-FDA-1088. Where should I keep my medication? Keep out of the reach of children. If you are using this medicine at home, you will be instructed on how to store it. Throw away any unused medicine after the expiration date on the label. NOTE: This sheet is a summary. It may not cover all possible information. If you have questions about this medicine, talk to your doctor, pharmacist, or health care provider.  2022 [employer] Standard (2020-12-05 11:54:14)  

## 2021-06-26 ENCOUNTER — Ambulatory Visit (HOSPITAL_COMMUNITY)
Admission: RE | Admit: 2021-06-26 | Discharge: 2021-06-26 | Disposition: A | Discharge status: Home / Self Care | Payer: Medicare HMO | Source: Ambulatory Visit | Attending: Family Medicine | Admitting: Family Medicine

## 2021-06-26 DIAGNOSIS — R1310 Dysphagia, unspecified: Secondary | ICD-10-CM | POA: Insufficient documentation

## 2021-06-30 ENCOUNTER — Other Ambulatory Visit: Payer: Self-pay | Admitting: Neurology

## 2021-06-30 ENCOUNTER — Ambulatory Visit: Payer: Medicare HMO | Admitting: Neurology

## 2021-06-30 ENCOUNTER — Encounter: Payer: Self-pay | Admitting: Neurology

## 2021-06-30 VITALS — BP 104/62 | HR 74 | Ht 59.0 in | Wt 109.0 lb

## 2021-06-30 DIAGNOSIS — N3946 Mixed incontinence: Secondary | ICD-10-CM | POA: Diagnosis not present

## 2021-06-30 DIAGNOSIS — G301 Alzheimer's disease with late onset: Secondary | ICD-10-CM

## 2021-06-30 DIAGNOSIS — E559 Vitamin D deficiency, unspecified: Secondary | ICD-10-CM | POA: Insufficient documentation

## 2021-06-30 DIAGNOSIS — F0280 Dementia in other diseases classified elsewhere without behavioral disturbance: Secondary | ICD-10-CM

## 2021-06-30 DIAGNOSIS — J309 Allergic rhinitis, unspecified: Secondary | ICD-10-CM | POA: Insufficient documentation

## 2021-06-30 DIAGNOSIS — D649 Anemia, unspecified: Secondary | ICD-10-CM | POA: Insufficient documentation

## 2021-06-30 DIAGNOSIS — M8588 Other specified disorders of bone density and structure, other site: Secondary | ICD-10-CM | POA: Insufficient documentation

## 2021-06-30 DIAGNOSIS — I10 Essential (primary) hypertension: Secondary | ICD-10-CM | POA: Insufficient documentation

## 2021-06-30 DIAGNOSIS — E782 Mixed hyperlipidemia: Secondary | ICD-10-CM | POA: Insufficient documentation

## 2021-06-30 DIAGNOSIS — R351 Nocturia: Secondary | ICD-10-CM | POA: Diagnosis not present

## 2021-06-30 DIAGNOSIS — R350 Frequency of micturition: Secondary | ICD-10-CM | POA: Diagnosis not present

## 2021-06-30 MED ORDER — MEMANTINE HCL 5 MG PO TABS: 10.0000 mg | ORAL_TABLET | Freq: Two times a day (BID) | ORAL | 3 refills | Status: DC

## 2021-06-30 NOTE — Patient Instructions (Addendum)
Labs today including B12, TSH Start Namenda   '5mg'$  nightly for one week then increase to '10mg'$  nightly  Follow up in 3 months     There are well-accepted and sensible ways to reduce risk for Alzheimers disease and other degenerative brain disorders .  Exercise Daily Walk A daily 20 minute walk should be part of your routine. Disease related apathy can be a significant roadblock to exercise and the only way to overcome this is to make it a daily routine and perhaps have a reward at the end (something your loved one loves to eat or drink perhaps) or a personal trainer coming to the home can also be very useful. Most importantly, the patient is much more likely to exercise if the caregiver / spouse does it with him/her. In general a structured, repetitive schedule is best.  General Health: Any diseases which effect your body will effect your brain such as a pneumonia, urinary infection, blood clot, heart attack or stroke. Keep contact with your primary care doctor for regular follow ups.  Sleep. A good nights sleep is healthy for the brain. Seven hours is recommended. If you have insomnia or poor sleep habits we can give you some instructions. If you have sleep apnea wear your mask.  Diet: Eating a heart healthy diet is also a good idea; fish and poultry instead of red meat, nuts (mostly non-peanuts), vegetables, fruits, olive oil or canola oil (instead of butter), minimal salt (use other spices to flavor foods), whole grain rice, bread, cereal and pasta and wine in moderation.Research is now showing that the MIND diet, which is a combination of The Mediterranean diet and the DASH diet, is beneficial for cognitive processing and longevity. Information about this diet can be found in The MIND Diet, a book by Doyne Keel, MS, RDN, and online at [email or web address]  Finances, Power of Attorney and Advance Directives: You should consider putting legal safeguards in place with  regard to financial and medical decision making. While the spouse always has power of attorney for medical and financial issues in the absence of any form, you should consider what you want in case the spouse / caregiver is no longer around or capable of making decisions.     Heart-head connection  New research shows there are things we can do to reduce the risk of mild cognitive impairment and dementia.  Several conditions known to increase the risk of cardiovascular disease -- such as high blood pressure, diabetes and high cholesterol -- also increase the risk of developing Alzheimer's. Some autopsy studies show that as many as 38 percent of individuals with Alzheimer's disease also have cardiovascular disease.  A longstanding question is why some people develop hallmark Alzheimer's plaques and tangles but do not develop the symptoms of Alzheimer's. Vascular disease may help researchers eventually find an answer. Some autopsy studies suggest that plaques and tangles may be present in the brain without causing symptoms of cognitive decline unless the brain also shows evidence of vascular disease. More research is needed to better understand the link between vascular health and Alzheimer's.  Physical exercise and diet Regular physical exercise may be a beneficial strategy to lower the risk of Alzheimer's and vascular dementia. Exercise may directly benefit brain cells by increasing blood and oxygen flow in the brain. Because of its known cardiovascular benefits, a medically approved exercise program is a valuable part of any overall wellness plan.  Current evidence suggests that heart-healthy eating may also help protect the brain. Heart-healthy  eating includes limiting the intake of sugar and saturated fats and making sure to eat plenty of fruits, vegetables, and whole grains. No one diet is best. Two diets that have been studied and may be beneficial are the DASH (Dietary Approaches to Stop  Hypertension) diet and the Mediterranean diet. The DASH diet emphasizes vegetables, fruits and fat-free or low-fat dairy products; includes whole grains, fish, poultry, beans, seeds, nuts and vegetable oils; and limits sodium, sweets, sugary beverages and red meats. A Mediterranean diet includes relatively little red meat and emphasizes whole grains, fruits and vegetables, fish and shellfish, and nuts, olive oil and other healthy fats.  Social connections and intellectual activity A number of studies indicate that maintaining strong social connections and keeping mentally active as we age might lower the risk of cognitive decline and Alzheimer's. Experts are not certain about the reason for this association. It may be due to direct mechanisms through which social and mental stimulation strengthen connections between nerve cells in the brain.  Head trauma There appears to be a strong link between future risk of Alzheimer's and serious head trauma, especially when injury involves loss of consciousness. You can help reduce your risk of Alzheimer's by protecting your head.  Wear a seat belt  Use a helmet when participating in sports  "Fall-proof" your home   What you can do now While research is not yet conclusive, certain lifestyle choices, such as physical activity and diet, may help support brain health and prevent Alzheimer's. Many of these lifestyle changes have been shown to lower the risk of other diseases, like heart disease and diabetes, which have been linked to Alzheimer's. With few drawbacks and plenty of known benefits, healthy lifestyle choices can improve your health and possibly protect your brain.  Learn more about brain health. You can help increase our knowledge by considering participation in a clinical study. Our free clinical trial matching services, [employer], can help you find clinical trials in your area that are seeking volunteers.

## 2021-06-30 NOTE — Progress Notes (Signed)
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  Patient reported she noticed memory decline for the past year.  She describes it as "feels like someone is confused me"  She reports incidents where she cannot think what she is about to say, that she forget people names, family members names, and she gets frustrated.   She lives at home with son, she reports that she is able to cook, clean, bath and dress herself.  She said that family and son also help.  She is currently not driving.  Family took away her car keys at the beginning of the year because she had issue with getting lost while driving.  Granddaughter drives her to her appointments and son helps with all her finances.  She reports that she is still able to do the same things like in the past but now she is forgetful  Patient reports that her headaches are better   Granddaughter at bedside reports the same story, that patient is still independent around the house but she is more forgetful, she is having word finding difficulties, forget people names, family member names, she asks the same questions over and over and also repeats herself a lot. She is also more frustrated. When ask about the frustration, patient reports that she has to "straighten them out, I am still [name]"   She  denies any abnormal sleep behavior, appetite is fine, and she currently getting chemo treatment for her DLBCL.     OTHER MEDICAL CONDITIONS: DLBCL of the nasopharynx, Hypertension, HLD, Vit D def, Anemia,    REVIEW OF SYSTEMS: Full 14 system review of systems performed and negative with exception of: as noted in the HPI   ALLERGIES: No Known Allergies  HOME MEDICATIONS: Outpatient Medications Prior to Visit  Medication Sig Dispense Refill   acetaminophen (TYLENOL) 500 MG tablet Take 1,000 mg by mouth every 6 (six) hours as needed for mild pain or fever.     allopurinol (ZYLOPRIM) 300 MG tablet Take 1 tablet (300 mg total) by mouth daily. 30 tablet 0   amLODipine (NORVASC) 10 MG tablet Take 10 mg by mouth daily.     apixaban (ELIQUIS) 5 MG TABS tablet Take 1 tablet (5 mg total) by mouth 2 (two) times daily. 60 tablet 0   atorvastatin (LIPITOR) 80 MG tablet Take 80 mg by mouth every morning.     ezetimibe (ZETIA) 10 MG tablet Take 10 mg by mouth every morning.     HYDROcodone-acetaminophen (NORCO/VICODIN) 5-325 MG tablet Take 1 tablet by mouth every 4 (four) hours as needed. 15 tablet 0   lidocaine-prilocaine (EMLA) cream Apply to affected area once 30 g 3   LORazepam (ATIVAN) 0.5 MG tablet Take 1 tablet (0.5 mg total) by mouth every 6 (six) hours as needed (Nausea or vomiting). 30 tablet 0  ondansetron (ZOFRAN) 8 MG tablet Take 1 tablet (8 mg total) by mouth 2 (two) times daily as needed for refractory nausea / vomiting. Start on day 3 after cyclophosphamide chemotherapy. 30 tablet 1   predniSONE (DELTASONE) 20 MG tablet Take 3 tablets (60 mg total) by mouth daily. Take with food on days 1-5 of chemotherapy. 15 tablet 5   prochlorperazine (COMPAZINE) 10 MG tablet Take 1 tablet (10 mg total) by mouth every 6 (six) hours as needed (Nausea or vomiting). 30 tablet 6   triamterene-hydrochlorothiazide (MAXZIDE-25) 37.5-25 MG tablet Take 1 tablet by mouth every morning.      Facility-Administered Medications Prior to Visit  Medication Dose Route Frequency Provider Last Rate Last Admin   sodium chloride flush (NS) 0.9 % injection 10 mL  10 mL Intracatheter PRN Benay Pike, MD   10 mL at 06/23/21 1643    PAST MEDICAL HISTORY: Past Medical History:  Diagnosis Date   Anemia    Arthritis    knees    GERD (gastroesophageal reflux disease)    Hiatal hernia    History of inguinal hernia repair    BIH   Hyperlipidemia    Hypertension    Memory problem     PAST SURGICAL HISTORY: Past Surgical History:  Procedure Laterality Date   COLONOSCOPY     HERNIA REPAIR  10/13/11   lap BIH rep w/ mesh- Dr. Tora Kindred HERNIA REPAIR  10/13/2011   Procedure: LAPAROSCOPIC INGUINAL HERNIA;  Surgeon: Adin Hector, MD;  Location: [facility];  Service: General;  Laterality: Bilateral;  Laparoscopic bilateral inguinal hernia repair, converted to open bilateral inguinal hernia repairs with mesh   IR IMAGING GUIDED PORT INSERTION  05/06/2021   NASAL ENDOSCOPY Left 04/08/2021   Procedure: NASAL ENDOSCOPY WITH BIOPSY OF LEFT NASOPPHARYNGEAL MASS;  Surgeon: Jason Coop, DO;  Location: [facility];  Service: ENT;  Laterality: Left;  Frozen Section    FAMILY HISTORY: Family History  Problem Relation Age of Onset   Heart disease Mother     SOCIAL HISTORY: Social History   Socioeconomic History   Marital status: Single    Spouse name: Not on file   Number of children: Not on file   Years of education: Not on file   Highest education level: Not on file  Occupational History   Not on file  Tobacco Use   Smoking status: Never   Smokeless tobacco: Never  Vaping Use   Vaping Use: Never used  Substance and Sexual Activity   Alcohol use: No   Drug use: No   Sexual activity: Not on file  Other Topics Concern   Not on file  Social History Narrative   Not on file   Social Determinants of Health   Financial Resource Strain: Not on file  Food Insecurity:  Not on file  Transportation Needs: Not on file  Physical Activity: Not on file  Stress: Not on file  Social Connections: Not on file  Intimate Partner Violence: Not on file     PHYSICAL EXAM  GENERAL EXAM/CONSTITUTIONAL: Vitals:  Vitals:   06/30/21 0852  BP: 104/62  Pulse: 74  Weight: 109 lb (49.4 kg)  Height: '4\' 11"'$  (1.499 m)   Body mass index is 22.02 kg/m. Wt Readings from Last 3 Encounters:  06/30/21 109 lb (49.4 kg)  06/23/21 108 lb 8 oz (49.2 kg)  05/27/21 106 lb 4.8 oz (48.2 kg)   Patient is in no distress; well developed, nourished and  groomed; neck is supple, accompanied by grand Programme researcher, broadcasting/film/video  CARDIOVASCULAR: Examination of carotid arteries is normal; no carotid bruits Regular rate and rhythm, no murmurs Bilateral edema present up to knees.   EYES: Pupils round and reactive to light, Visual fields full to confrontation, Extraocular movements intacts,    MUSCULOSKELETAL: Gait, strength, tone, movements noted in Neurologic exam below  NEUROLOGIC: MENTAL STATUS:  MMSE - [place] Exam 06/30/2021  Orientation to time 0  Orientation to Place 4  Registration 3  Attention/ Calculation 0  Recall 0  Language- name 2 objects 2  Language- repeat 0  Language- follow 3 step command 2  Language- read & follow direction 1  Write a sentence 1  Copy design 0  Total score 13   awake, alert, oriented to person, place and time recent and remote memory intact normal attention and concentration language fluent, comprehension intact, naming intact fund of knowledge appropriate  CRANIAL NERVE:  2nd, 3rd, 4th, 6th - pupils equal and reactive to light, visual fields full to confrontation, extraocular muscles intact, no nystagmus 5th - facial sensation symmetric 7th - facial strength symmetric 8th - hearing intact 9th - palate elevates symmetrically, uvula midline 11th - shoulder shrug symmetric 12th - tongue protrusion midline  MOTOR:  normal bulk and  tone, full strength in the BUE, BLE  SENSORY:  normal and symmetric to light touch, pinprick, temperature, vibration  COORDINATION:  finger-nose-finger, fine finger movements normal  REFLEXES:  deep tendon reflexes present and symmetric  GAIT/STATION:  Hesitant     DIAGNOSTIC DATA (LABS, IMAGING, TESTING) - I reviewed patient records, labs, notes, testing and imaging myself where available.  Lab Results  Component Value Date   WBC 8.1 06/23/2021   HGB 9.0 (L) 06/23/2021   HCT 28.5 (L) 06/23/2021   MCV 72.7 (L) 06/23/2021   PLT 240 06/23/2021      Component Value Date/Time   NA 141 06/23/2021 1007   K 3.8 06/23/2021 1007   CL 105 06/23/2021 1007   CO2 26 06/23/2021 1007   GLUCOSE 78 06/23/2021 1007   BUN 14 06/23/2021 1007   CREATININE 0.42 (L) 06/23/2021 1007   CALCIUM 8.9 06/23/2021 1007   PROT 6.9 06/23/2021 1007   ALBUMIN 3.7 06/23/2021 1007   AST 22 06/23/2021 1007   ALT 21 06/23/2021 1007   ALKPHOS 160 (H) 06/23/2021 1007   BILITOT 0.5 06/23/2021 1007   GFRNONAA >60 06/23/2021 1007   GFRAA >90 10/07/2011 1015   No results found for: CHOL, HDL, LDLCALC, LDLDIRECT, TRIG, CHOLHDL No results found for: HGBA1C No results found for: VITAMINB12 No results found for: TSH  MRI Brain 03/22/2021 reviewed.   1. No acute intracranial abnormality. 2. 3.2 x 2.0 x 3.5 cm heterogeneous mass positioned at the left nasopharynx near the fossa of [place]. 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 ENT consultation and referral recommended. 3. Chronic left maxillary and ethmoidal sinusitis. 4. Mild chronic microvascular ischemic disease.    ASSESSMENT AND PLAN  87 y.o. year old female here with granddaughter for memory decline for the past 5 years per granddaughter. Patient reports that she has trouble remembering events, trouble with words and also trouble with names. Her MMSE score is 13/30 and is consistent with  Dementia, likely Alzheimer type. Will check a TSH and B12 level and start her on Namenda.    Ddx:  1. Late onset Alzheimer's dementia without behavioral disturbance ([place])       PLAN  Labs today including B12, TSH Start Namenda   '5mg'$  nightly for one week then increase to '10mg'$  nightly  Follow up in 3 months     There are well-accepted and sensible ways to reduce risk for Alzheimers disease and other degenerative brain disorders .  Exercise Daily Walk A daily 20 minute walk should be part of your routine. Disease related apathy can be a significant roadblock to exercise and the only way to overcome this is to make it a daily routine and perhaps have a reward at the end (something your loved one loves to eat or drink perhaps) or a personal trainer coming to the home can also be very useful. Most importantly, the patient is much more likely to exercise if the caregiver / spouse does it with him/her. In general a structured, repetitive schedule is best.  General Health: Any diseases which effect your body will effect your brain such as a pneumonia, urinary infection, blood clot, heart attack or stroke. Keep contact with your primary care doctor for regular follow ups.  Sleep. A good nights sleep is healthy for the brain. Seven hours is recommended. If you have insomnia or poor sleep habits we can give you some instructions. If you have sleep apnea wear your mask.  Diet: Eating a heart healthy diet is also a good idea; fish and poultry instead of red meat, nuts (mostly non-peanuts), vegetables, fruits, olive oil or canola oil (instead of butter), minimal salt (use other spices to flavor foods), whole grain rice, bread, cereal and pasta and wine in moderation.Research is now showing that the MIND diet, which is a combination of The Mediterranean diet and the DASH diet, is beneficial for cognitive processing and longevity. Information about this diet can be found in The MIND Diet, a book by Doyne Keel, MS, RDN, and online at [email or web address].si  Finances, Power of Attorney and Advance Directives: You should consider putting legal safeguards in place with regard to financial and medical decision making. While the spouse always has power of attorney for medical and financial issues in the absence of any form, you should consider what you want in case the spouse / caregiver is no longer around or capable of making decisions.     Heart-head connection  New research shows there are things we can do to reduce the risk of mild cognitive impairment and dementia.  Several conditions known to increase the risk of cardiovascular disease -- such as high blood pressure, diabetes and high cholesterol -- also increase the risk of developing Alzheimer's. Some autopsy studies show that as many as 6 percent of individuals with Alzheimer's disease also have cardiovascular disease.  A longstanding question is why some people develop hallmark Alzheimer's plaques and tangles but do not develop the symptoms of Alzheimer's. Vascular disease may help researchers eventually find an answer. Some autopsy studies suggest that plaques and tangles may be present in the brain without causing symptoms of cognitive decline unless the brain also shows evidence of vascular disease. More research is needed to better understand the link between vascular health and Alzheimer's.  Physical exercise and diet Regular physical exercise may be a beneficial strategy to lower the risk of Alzheimer's and vascular dementia. Exercise may directly benefit brain cells by increasing blood and oxygen flow in the brain. Because of its known cardiovascular benefits, a medically approved exercise program is a valuable part of any overall wellness plan.  Current evidence suggests that heart-healthy eating may also help protect the brain. Heart-healthy  eating includes limiting the intake of sugar and saturated fats and  making sure to eat plenty of fruits, vegetables, and whole grains. No one diet is best. Two diets that have been studied and may be beneficial are the DASH (Dietary Approaches to Stop Hypertension) diet and the Mediterranean diet. The DASH diet emphasizes vegetables, fruits and fat-free or low-fat dairy products; includes whole grains, fish, poultry, beans, seeds, nuts and vegetable oils; and limits sodium, sweets, sugary beverages and red meats. A Mediterranean diet includes relatively little red meat and emphasizes whole grains, fruits and vegetables, fish and shellfish, and nuts, olive oil and other healthy fats.  Social connections and intellectual activity A number of studies indicate that maintaining strong social connections and keeping mentally active as we age might lower the risk of cognitive decline and Alzheimer's. Experts are not certain about the reason for this association. It may be due to direct mechanisms through which social and mental stimulation strengthen connections between nerve cells in the brain.  Head trauma There appears to be a strong link between future risk of Alzheimer's and serious head trauma, especially when injury involves loss of consciousness. You can help reduce your risk of Alzheimer's by protecting your head.  Wear a seat belt  Use a helmet when participating in sports  "Fall-proof" your home   What you can do now While research is not yet conclusive, certain lifestyle choices, such as physical activity and diet, may help support brain health and prevent Alzheimer's. Many of these lifestyle changes have been shown to lower the risk of other diseases, like heart disease and diabetes, which have been linked to Alzheimer's. With few drawbacks and plenty of known benefits, healthy lifestyle choices can improve your health and possibly protect your brain.  Learn more about brain health. You can help increase our knowledge by considering participation in a clinical  study. Our free clinical trial matching services, TrialMatch, can help you find clinical trials in your area that are seeking volunteers.      Orders Placed This Encounter  Procedures   TSH   Vitamin B12    Meds ordered this encounter  Medications   memantine (NAMENDA) 5 MG tablet    Sig: Take 2 tablets (10 mg total) by mouth 2 (two) times daily for 30 doses.    Dispense:  60 tablet    Refill:  3    Return in about 3 months (around 09/30/2021).    Alric Ran, MD 06/30/2021, 10:17 AM  [facility] [address], [place], [place] [address] [phone]

## 2021-07-01 ENCOUNTER — Other Ambulatory Visit: Payer: Self-pay | Admitting: Neurology

## 2021-07-01 MED ORDER — MEMANTINE HCL 10 MG PO TABS: 10.0000 mg | ORAL_TABLET | Freq: Every day | ORAL | 1 refills | Status: DC

## 2021-07-03 LAB — VITAMIN B12: Vitamin B-12: >2000 pg/mL — ABNORMAL HIGH (ref 232–1245)

## 2021-07-03 LAB — TSH: TSH: 0.245 u[IU]/mL — ABNORMAL LOW (ref 0.450–4.500)

## 2021-07-08 ENCOUNTER — Encounter: Payer: Self-pay | Admitting: Hematology and Oncology

## 2021-07-08 DIAGNOSIS — E441 Mild protein-calorie malnutrition: Secondary | ICD-10-CM | POA: Insufficient documentation

## 2021-07-08 DIAGNOSIS — F4320 Adjustment disorder, unspecified: Secondary | ICD-10-CM | POA: Insufficient documentation

## 2021-07-09 DIAGNOSIS — R7989 Other specified abnormal findings of blood chemistry: Secondary | ICD-10-CM | POA: Diagnosis not present

## 2021-07-09 DIAGNOSIS — D6869 Other thrombophilia: Secondary | ICD-10-CM | POA: Diagnosis not present

## 2021-07-09 DIAGNOSIS — G301 Alzheimer's disease with late onset: Secondary | ICD-10-CM | POA: Diagnosis not present

## 2021-07-09 DIAGNOSIS — R946 Abnormal results of thyroid function studies: Secondary | ICD-10-CM | POA: Diagnosis not present

## 2021-07-09 DIAGNOSIS — I82402 Acute embolism and thrombosis of unspecified deep veins of left lower extremity: Secondary | ICD-10-CM | POA: Diagnosis not present

## 2021-07-09 DIAGNOSIS — N3281 Overactive bladder: Secondary | ICD-10-CM | POA: Diagnosis not present

## 2021-07-09 DIAGNOSIS — I10 Essential (primary) hypertension: Secondary | ICD-10-CM | POA: Diagnosis not present

## 2021-07-09 DIAGNOSIS — E782 Mixed hyperlipidemia: Secondary | ICD-10-CM | POA: Diagnosis not present

## 2021-07-09 DIAGNOSIS — K649 Unspecified hemorrhoids: Secondary | ICD-10-CM | POA: Diagnosis not present

## 2021-07-09 DIAGNOSIS — C8330 Diffuse large B-cell lymphoma, unspecified site: Secondary | ICD-10-CM | POA: Diagnosis not present

## 2021-07-15 ENCOUNTER — Other Ambulatory Visit: Payer: Self-pay

## 2021-07-15 ENCOUNTER — Inpatient Hospital Stay: Payer: Medicare HMO

## 2021-07-15 ENCOUNTER — Inpatient Hospital Stay: Payer: Medicare HMO | Admitting: Hematology and Oncology

## 2021-07-15 ENCOUNTER — Encounter: Payer: Self-pay | Admitting: Hematology and Oncology

## 2021-07-15 VITALS — BP 123/69 | HR 61 | Temp 97.7°F | Resp 17

## 2021-07-15 VITALS — BP 131/74 | HR 88 | Temp 97.9°F | Resp 16 | Ht 59.0 in | Wt 111.6 lb

## 2021-07-15 DIAGNOSIS — C8330 Diffuse large B-cell lymphoma, unspecified site: Secondary | ICD-10-CM

## 2021-07-15 DIAGNOSIS — Z5111 Encounter for antineoplastic chemotherapy: Secondary | ICD-10-CM | POA: Diagnosis not present

## 2021-07-15 DIAGNOSIS — Z5189 Encounter for other specified aftercare: Secondary | ICD-10-CM | POA: Diagnosis not present

## 2021-07-15 DIAGNOSIS — Z79899 Other long term (current) drug therapy: Secondary | ICD-10-CM | POA: Diagnosis not present

## 2021-07-15 DIAGNOSIS — D649 Anemia, unspecified: Secondary | ICD-10-CM

## 2021-07-15 DIAGNOSIS — Z7901 Long term (current) use of anticoagulants: Secondary | ICD-10-CM | POA: Diagnosis not present

## 2021-07-15 DIAGNOSIS — C8331 Diffuse large B-cell lymphoma, lymph nodes of head, face, and neck: Secondary | ICD-10-CM | POA: Diagnosis not present

## 2021-07-15 DIAGNOSIS — R634 Abnormal weight loss: Secondary | ICD-10-CM

## 2021-07-15 DIAGNOSIS — Z5112 Encounter for antineoplastic immunotherapy: Secondary | ICD-10-CM | POA: Diagnosis not present

## 2021-07-15 DIAGNOSIS — I10 Essential (primary) hypertension: Secondary | ICD-10-CM | POA: Diagnosis not present

## 2021-07-15 LAB — CBC WITH DIFFERENTIAL (CANCER CENTER ONLY)
Abs Immature Granulocytes: 0.01 10*3/uL (ref 0.00–0.07)
Basophils Absolute: 0 10*3/uL (ref 0.0–0.1)
Basophils Relative: 1 %
Eosinophils Absolute: 0.1 10*3/uL (ref 0.0–0.5)
Eosinophils Relative: 2 %
HCT: 29 % — ABNORMAL LOW (ref 36.0–46.0)
Hemoglobin: 9.4 g/dL — ABNORMAL LOW (ref 12.0–15.0)
Immature Granulocytes: 0 %
Lymphocytes Relative: 34 %
Lymphs Abs: 1.9 10*3/uL (ref 0.7–4.0)
MCH: 23.5 pg — ABNORMAL LOW (ref 26.0–34.0)
MCHC: 32.4 g/dL (ref 30.0–36.0)
MCV: 72.5 fL — ABNORMAL LOW (ref 80.0–100.0)
Monocytes Absolute: 0.6 10*3/uL (ref 0.1–1.0)
Monocytes Relative: 11 %
Neutro Abs: 2.9 10*3/uL (ref 1.7–7.7)
Neutrophils Relative %: 52 %
Platelet Count: 288 10*3/uL (ref 150–400)
RBC: 4 MIL/uL (ref 3.87–5.11)
RDW: 20.7 % — ABNORMAL HIGH (ref 11.5–15.5)
WBC Count: 5.5 10*3/uL (ref 4.0–10.5)
nRBC: 0 % (ref 0.0–0.2)

## 2021-07-15 LAB — CMP (CANCER CENTER ONLY)
ALT: 17 U/L (ref 0–44)
AST: 19 U/L (ref 15–41)
Albumin: 3.4 g/dL — ABNORMAL LOW (ref 3.5–5.0)
Alkaline Phosphatase: 159 U/L — ABNORMAL HIGH (ref 38–126)
Anion gap: 8 (ref 5–15)
BUN: 12 mg/dL (ref 8–23)
CO2: 26 mmol/L (ref 22–32)
Calcium: 9 mg/dL (ref 8.9–10.3)
Chloride: 109 mmol/L (ref 98–111)
Creatinine: 0.55 mg/dL (ref 0.44–1.00)
GFR, Estimated: >60 mL/min (ref >60)
Glucose, Bld: 78 mg/dL (ref 70–99)
Potassium: 3.7 mmol/L (ref 3.5–5.1)
Sodium: 143 mmol/L (ref 135–145)
Total Bilirubin: 0.4 mg/dL (ref 0.3–1.2)
Total Protein: 6.5 g/dL (ref 6.5–8.1)

## 2021-07-15 MED ORDER — PALONOSETRON HCL INJECTION 0.25 MG/5ML
0.2500 mg | Freq: Once | INTRAVENOUS | Status: AC
  Administered 2021-07-15: 0.25 mg via INTRAVENOUS
  Filled 2021-07-15: qty 5

## 2021-07-15 MED ORDER — SODIUM CHLORIDE 0.9 % IV SOLN
400.0000 mg/m2 | Freq: Once | INTRAVENOUS | Status: AC
  Administered 2021-07-15: 560 mg via INTRAVENOUS
  Filled 2021-07-15: qty 28

## 2021-07-15 MED ORDER — SODIUM CHLORIDE 0.9 % IV SOLN: Freq: Once | INTRAVENOUS | Status: AC

## 2021-07-15 MED ORDER — VINCRISTINE SULFATE CHEMO INJECTION 1 MG/ML
1.0000 mg | Freq: Once | INTRAVENOUS | Status: AC
  Administered 2021-07-15: 1 mg via INTRAVENOUS
  Filled 2021-07-15: qty 1

## 2021-07-15 MED ORDER — DIPHENHYDRAMINE HCL 25 MG PO CAPS
50.0000 mg | ORAL_CAPSULE | Freq: Once | ORAL | Status: AC
  Administered 2021-07-15: 50 mg via ORAL
  Filled 2021-07-15: qty 2

## 2021-07-15 MED ORDER — SODIUM CHLORIDE 0.9 % IV SOLN
10.0000 mg | Freq: Once | INTRAVENOUS | Status: AC
  Administered 2021-07-15: 10 mg via INTRAVENOUS
  Filled 2021-07-15: qty 10

## 2021-07-15 MED ORDER — SODIUM CHLORIDE 0.9% FLUSH
10.0000 mL | INTRAVENOUS | Status: DC | PRN
  Administered 2021-07-15: 10 mL

## 2021-07-15 MED ORDER — HEPARIN SOD (PORK) LOCK FLUSH 100 UNIT/ML IV SOLN
500.0000 [IU] | Freq: Once | INTRAVENOUS | Status: AC | PRN
  Administered 2021-07-15: 500 [IU]

## 2021-07-15 MED ORDER — ALLOPURINOL 300 MG PO TABS: 300.0000 mg | ORAL_TABLET | Freq: Every day | ORAL | 0 refills | Status: DC

## 2021-07-15 MED ORDER — DOXORUBICIN HCL CHEMO IV INJECTION 2 MG/ML
25.0000 mg/m2 | Freq: Once | INTRAVENOUS | Status: AC
  Administered 2021-07-15: 36 mg via INTRAVENOUS
  Filled 2021-07-15: qty 18

## 2021-07-15 MED ORDER — SODIUM CHLORIDE 0.9 % IV SOLN
150.0000 mg | Freq: Once | INTRAVENOUS | Status: AC
  Administered 2021-07-15: 150 mg via INTRAVENOUS
  Filled 2021-07-15: qty 150

## 2021-07-15 MED ORDER — SODIUM CHLORIDE 0.9 % IV SOLN
375.0000 mg/m2 | Freq: Once | INTRAVENOUS | Status: AC
  Administered 2021-07-15: 500 mg via INTRAVENOUS
  Filled 2021-07-15: qty 50

## 2021-07-15 MED ORDER — ACETAMINOPHEN 325 MG PO TABS
650.0000 mg | ORAL_TABLET | Freq: Once | ORAL | Status: AC
  Administered 2021-07-15: 650 mg via ORAL
  Filled 2021-07-15: qty 2

## 2021-07-15 NOTE — Patient Instructions (Signed)
[facility]  Discharge Instructions: Thank you for choosing [facility] to provide your oncology and hematology care.   If you have a lab appointment with the [facility], please go directly to the [facility] and check in at the registration area.   Wear comfortable clothing and clothing appropriate for easy access to any Portacath or PICC line.   We strive to give you quality time with your provider. You may need to reschedule your appointment if you arrive late (15 or more minutes).  Arriving late affects you and other patients whose appointments are after yours.  Also, if you miss three or more appointments without notifying the office, you may be dismissed from the clinic at the provider's discretion.      For prescription refill requests, have your pharmacy contact our office and allow 72 hours for refills to be completed.    Today you received the following chemotherapy and/or immunotherapy agents :Adriamycin, Vincristine, Cytoxan, Rituxan      To help prevent nausea and vomiting after your treatment, we encourage you to take your nausea medication as directed.  BELOW ARE SYMPTOMS THAT SHOULD BE REPORTED IMMEDIATELY: *FEVER GREATER THAN 100.4 F (38 C) OR HIGHER *CHILLS OR SWEATING *NAUSEA AND VOMITING THAT IS NOT CONTROLLED WITH YOUR NAUSEA MEDICATION *UNUSUAL SHORTNESS OF BREATH *UNUSUAL BRUISING OR BLEEDING *URINARY PROBLEMS (pain or burning when urinating, or frequent urination) *BOWEL PROBLEMS (unusual diarrhea, constipation, pain near the anus) TENDERNESS IN MOUTH AND THROAT WITH OR WITHOUT PRESENCE OF ULCERS (sore throat, sores in mouth, or a toothache) UNUSUAL RASH, SWELLING OR PAIN  UNUSUAL VAGINAL DISCHARGE OR ITCHING   Items with * indicate a potential emergency and should be followed up as soon as possible or go to the Emergency Department if any problems should occur.  Please show the CHEMOTHERAPY ALERT CARD or  IMMUNOTHERAPY ALERT CARD at check-in to the Emergency Department and triage nurse.  Should you have questions after your visit or need to cancel or reschedule your appointment, please contact [facility]  Dept: [phone]  and follow the prompts.  Office hours are 8:00 a.m. to 4:30 p.m. Monday - Friday. Please note that voicemails left after 4:00 p.m. may not be returned until the following business day.  We are closed weekends and major holidays. You have access to a nurse at all times for urgent questions. Please call the main number to the clinic Dept: [phone] and follow the prompts.   For any non-urgent questions, you may also contact your provider using MyChart. We now offer e-Visits for anyone 59 and older to request care online for non-urgent symptoms. For details visit [email or web address].   Also download the MyChart app! Go to the app store, search "MyChart", open the app, select [facility], and log in with your MyChart username and password.  Due to Covid, a mask is required upon entering the hospital/clinic. If you do not have a mask, one will be given to you upon arrival. For doctor visits, patients may have 1 support person aged 52 or older with them. For treatment visits, patients cannot have anyone with them due to current Covid guidelines and our immunocompromised population.

## 2021-07-15 NOTE — Assessment & Plan Note (Signed)
Weight loss improving,  Patient feeling better since last visit. Appetite is improved. Continue to monitor.

## 2021-07-15 NOTE — Assessment & Plan Note (Signed)
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  After last treatment, no major toxicities to report.  Her bilateral lower extremity swelling has also significantly improved, her weight is improving. She tolerated second cycle very well except for an episode of dizziness and a day of nausea.  She had a mechanical fall after tripping down on her dog but did not get hurt.  Weight continues to improve, eating better, gained about 2 pounds.  No neuropathy reported.  No other adverse effects.  She is willing to proceed with cycle 3 as planned today.  Labs reviewed and okay to proceed.  I have gone over the medications once again and informed the son that she should be taking allopurinol for now and prednisone as instructed for the first 5 days of each cycle of chemotherapy. Return to clinic in 3 weeks.  Interim PET/CT ordered.

## 2021-07-15 NOTE — Progress Notes (Signed)
[name] CONSULT NOTE  Patient Care Team: [name] Contras, MD as PCP - General (Family Medicine)  CHIEF COMPLAINTS/PURPOSE OF CONSULTATION:  DLBCL of nasopharynx.  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  After last treatment, no major toxicities to report.  Her bilateral lower extremity swelling has also significantly improved, her weight is improving. She tolerated second cycle very well except for an episode of dizziness and a day of nausea.  She had a mechanical fall after tripping down on her dog but did not get hurt.  Weight continues to improve, eating better, gained about 2 pounds.  No neuropathy reported.  No other adverse effects.  She is willing to proceed with cycle 3 as planned today.  Labs reviewed and okay to proceed.  I have gone over the medications once again and informed the son that she should be taking allopurinol for now and prednisone as instructed for the first 5 days of each cycle of chemotherapy. Return to clinic in 3 weeks.  Interim PET/CT ordered.  Normocytic normochromic anemia Hemoglobin of 9.2 g today.  No indication for transfusion.  We will continue to monitor this.  Weight loss, unintentional Weight loss improving,  Patient feeling better since last visit. Appetite is improved. Continue to monitor.  Orders Placed This Encounter  Procedures   NM PET Image Initial (PI) Skull Base To Thigh    Standing Status:   Future    Standing Expiration Date:   07/15/2022    Order Specific Question:   If indicated for the ordered procedure, I authorize the administration of a radiopharmaceutical per Radiology protocol    Answer:   Yes    Order Specific Question:   Preferred  imaging location?    Answer:   [name]   Although she might benefit from CNS prophylaxis, she is a very high risk for methotrexate infusions given her age and performance status.  we will monitor closely and if she continues to do well at the end of the treatment can consider intrathecal methotrexate.  HISTORY OF PRESENTING ILLNESS:   [name] 87 y.o. female is here because of new diagnosis of DLBCL of nasopharynx.  [name] is a very pleasant 87 year old female patient who arrived today with her granddaughter [name] to the appointment.  She does not quite remember things as explained and has short-term memory loss.  After a bit of the conversation, she did admit to me that she was told about a cancer in her nose which was biopsied last week but she cannot remember the details.  [name] mentioned that for the past year or so [name] has been having more nasal drainage and has progressively lost weight in the past couple months of at least 20 pounds, has become relatively more sedentary and inactive, has no appetite.  They initially thought she might be having a stroke when she was not getting out of bed for 3days and has not been eating.  When she was in the ER.  She had some imaging CT head without contrast which showed left maxillary and ethmoid sinus disease. She then had MRI brain on the same day which showed 3.2 x 2 x 3.5 cm heterogeneous mass  positioned at the left nasopharynx near the fossa of Rosenmuller.  Finding is most concerning for a primary nasopharyngeal neoplasm.  ENT consultation was recommended.  She then went on to see [name].She had nasal endoscopy with biopsy of left nasopharyngeal mass.  According to Dr. Roseanna [name], polypoid mass lesion was noted to be lymphoma from the posterior aspect of the left middle turbinate.  Pathology showed findings consistent with diffuse large B-cell lymphoma of the left nasopharyngeal mass as well as left middle turbinate.    PET/CT showed hypermetabolic left nasopharyngeal mass, no associated neck adenopathy, 2 large hypermetabolic liver lesions, destructive lytic metastatic bone lesions.  Since there was no lymphadenopathy and no unusual appearance of spleen, second biopsy was recommended to confirm the diagnosis.  Left ilium  Biopsy showed findings consistent with involvement by non-Hodgkin B-cell lymphoma.  Features are similar to previously known large B-cell lymphoma consistent with involvement by the same disease process.  Cycle day 1 of R mini CHOP on 06/03/2021. C2D1 06/23/2021  Interval History  She is here for follow-up with her son.   She has been doing well today. Since last chemo, she felt ok except for a day of dizziness and a day of nausea. No vomiting, diarrhea. Appetite is well, gained a couple lbs since last visit BLE swelling stable, not wearing compression stockings. No pain, change in breathing. No neuropathy reported. Overall patient is very pleased that she is doing well, Rest of the pertinent 10 point ROS reviewed and negative.  MEDICAL HISTORY:  Past Medical History:  Diagnosis Date   Anemia    Arthritis    knees    GERD (gastroesophageal reflux disease)    Hiatal hernia    History of inguinal hernia repair    BIH   Hyperlipidemia    Hypertension    Memory problem     SURGICAL HISTORY: Past Surgical History:  Procedure Laterality Date   COLONOSCOPY     HERNIA REPAIR  10/13/11   lap BIH rep w/ mesh- Dr. Tora Kindred HERNIA REPAIR  10/13/2011   Procedure: LAPAROSCOPIC INGUINAL HERNIA;  Surgeon: [name] Hector, MD;  Location: [facility];  Service: General;  Laterality: Bilateral;  Laparoscopic bilateral inguinal hernia repair, converted to open bilateral inguinal hernia repairs with mesh   IR IMAGING GUIDED PORT INSERTION  05/06/2021   NASAL ENDOSCOPY Left 04/08/2021   Procedure: NASAL ENDOSCOPY WITH BIOPSY OF LEFT NASOPPHARYNGEAL MASS;  Surgeon: [name] Coop,  DO;  Location: [facility];  Service: ENT;  Laterality: Left;  Frozen Section    SOCIAL HISTORY: Social History   Socioeconomic History   Marital status: Single    Spouse name: Not on file   Number of children: Not on file   Years of education: Not on file   Highest education level: Not on file  Occupational History   Not on file  Tobacco Use   Smoking status: Never   Smokeless tobacco: Never  Vaping Use   Vaping Use: Never used  Substance and Sexual Activity   Alcohol use: No   Drug use: No   Sexual activity: Not on file  Other Topics Concern   Not on file  Social History Narrative   Not on file   Social Determinants of Health   Financial Resource Strain: Not on file  Food Insecurity: Not on file  Transportation Needs: Not on file  Physical Activity: Not on file  Stress: Not on file  Social Connections: Not on  file  Intimate Partner Violence: Not on file    FAMILY HISTORY: Family History  Problem Relation Age of Onset   Heart disease Mother     ALLERGIES:  has No Known Allergies.  MEDICATIONS:  Current Outpatient Medications  Medication Sig Dispense Refill   acetaminophen (TYLENOL) 500 MG tablet Take 1,000 mg by mouth every 6 (six) hours as needed for mild pain or fever.     allopurinol (ZYLOPRIM) 300 MG tablet Take 1 tablet (300 mg total) by mouth daily. 30 tablet 0   amLODipine (NORVASC) 10 MG tablet Take 10 mg by mouth daily.     apixaban (ELIQUIS) 5 MG TABS tablet Take 1 tablet (5 mg total) by mouth 2 (two) times daily. 60 tablet 0   atorvastatin (LIPITOR) 80 MG tablet Take 80 mg by mouth every morning.     ezetimibe (ZETIA) 10 MG tablet Take 10 mg by mouth every morning.     HYDROcodone-acetaminophen (NORCO/VICODIN) 5-325 MG tablet Take 1 tablet by mouth every 4 (four) hours as needed. 15 tablet 0   lidocaine-prilocaine (EMLA) cream Apply to affected area once 30 g 3   LORazepam (ATIVAN) 0.5 MG tablet Take 1 tablet (0.5 mg total) by mouth every 6 (six) hours  as needed (Nausea or vomiting). 30 tablet 0   memantine (NAMENDA) 10 MG tablet Take 1/2 tablet (5 mg) for 1 week at bedtime, then 1 tablet (10 mg) at bedtime. 27 tablet 0   [START ON 07/31/2021] memantine (NAMENDA) 10 MG tablet Take 1 tablet (10 mg total) by mouth at bedtime. 90 tablet 1   ondansetron (ZOFRAN) 8 MG tablet Take 1 tablet (8 mg total) by mouth 2 (two) times daily as needed for refractory nausea / vomiting. Start on day 3 after cyclophosphamide chemotherapy. 30 tablet 1   predniSONE (DELTASONE) 20 MG tablet Take 3 tablets (60 mg total) by mouth daily. Take with food on days 1-5 of chemotherapy. 15 tablet 5   prochlorperazine (COMPAZINE) 10 MG tablet Take 1 tablet (10 mg total) by mouth every 6 (six) hours as needed (Nausea or vomiting). 30 tablet 6   No current facility-administered medications for this visit.   Facility-Administered Medications Ordered in Other Visits  Medication Dose Route Frequency Provider Last Rate Last Admin   cyclophosphamide (CYTOXAN) 560 mg in sodium chloride 0.9 % 250 mL chemo infusion  400 mg/m2 (Treatment Plan Recorded) Intravenous Once Shunta Mclaurin, Arletha Pili, MD       DOXOrubicin (ADRIAMYCIN) chemo injection 36 mg  25 mg/m2 (Treatment Plan Recorded) Intravenous Once Valisa Karpel, Arletha Pili, MD       heparin lock flush 100 unit/mL  500 Units Intracatheter Once PRN Daren Yeagle, Arletha Pili, MD       riTUXimab-pvvr (RUXIENCE) 500 mg in sodium chloride 0.9 % 200 mL infusion  375 mg/m2 (Treatment Plan Recorded) Intravenous Once Noela Brothers, MD       sodium chloride flush (NS) 0.9 % injection 10 mL  10 mL Intracatheter PRN Tamicka Shimon, MD       vinCRIStine (ONCOVIN) 1 mg in sodium chloride 0.9 % 50 mL chemo infusion  1 mg Intravenous Once Leona Alen, Arletha Pili, MD        PHYSICAL EXAMINATION:  ECOG PERFORMANCE STATUS: 1 - Symptomatic but completely ambulatory   Physical Exam Constitutional:      Appearance: Normal appearance.  HENT:     Head: Normocephalic and atraumatic.   Eyes:     Pupils: Pupils are equal, round, and reactive to light.  Cardiovascular:  Rate and Rhythm: Normal rate and regular rhythm.     Pulses: Normal pulses.     Heart sounds: Normal heart sounds.  Pulmonary:     Effort: Pulmonary effort is normal.     Breath sounds: Normal breath sounds.  Abdominal:     General: Abdomen is flat. Bowel sounds are normal.     Palpations: Abdomen is soft.  Musculoskeletal:        General: Swelling (Bilateral lower extremity swelling stable since last visit) present. No tenderness.     Cervical back: Normal range of motion and neck supple. No rigidity.  Lymphadenopathy:     Cervical: No cervical adenopathy.  Skin:    General: Skin is warm and dry.  Neurological:     General: No focal deficit present.     Mental Status: She is alert.  Psychiatric:        Mood and Affect: Mood normal.     LABORATORY DATA:  I have reviewed the data as listed Lab Results  Component Value Date   WBC 5.5 07/15/2021   HGB 9.4 (L) 07/15/2021   HCT 29.0 (L) 07/15/2021   MCV 72.5 (L) 07/15/2021   PLT 288 07/15/2021     Chemistry      Component Value Date/Time   NA 143 07/15/2021 0902   K 3.7 07/15/2021 0902   CL 109 07/15/2021 0902   CO2 26 07/15/2021 0902   BUN 12 07/15/2021 0902   CREATININE 0.55 07/15/2021 0902      Component Value Date/Time   CALCIUM 9.0 07/15/2021 0902   ALKPHOS 159 (H) 07/15/2021 0902   AST 19 07/15/2021 0902   ALT 17 07/15/2021 0902   BILITOT 0.4 07/15/2021 0902      RADIOGRAPHIC STUDIES: I have personally reviewed the radiological images as listed and agreed with the findings in the report. DG SWALLOW FUNC OP MEDICARE SPEECH PATH  Result Date: 06/26/2021 Formatting of this result is different from the original. Objective Swallowing Evaluation: Type of Study: MBS-Modified Barium Swallow Study  Patient Details Name: [name] MRN: [id number] Date of Birth: [birth year] Today's Date: 06/26/2021 Time: SLP Start Time (ACUTE ONLY):  68 -SLP Stop Time (ACUTE ONLY): [id number] SLP Time Calculation (min) (ACUTE ONLY): 30 min Past Medical History: Past Medical History: Diagnosis Date  Anemia   Arthritis   knees   GERD (gastroesophageal reflux disease)   Hiatal hernia   History of inguinal hernia repair   BIH  Hyperlipidemia   Hypertension   Memory problem  Past Surgical History: Past Surgical History: Procedure Laterality Date  COLONOSCOPY    HERNIA REPAIR  10/13/11  lap BIH rep w/ mesh- Dr. Tora Kindred HERNIA REPAIR  10/13/2011  Procedure: LAPAROSCOPIC INGUINAL HERNIA;  Surgeon: [name] Hector, MD;  Location: [facility];  Service: General;  Laterality: Bilateral;  Laparoscopic bilateral inguinal hernia repair, converted to open bilateral inguinal hernia repairs with mesh  IR IMAGING GUIDED PORT INSERTION  05/06/2021  NASAL ENDOSCOPY Left 04/08/2021  Procedure: NASAL ENDOSCOPY WITH BIOPSY OF LEFT NASOPPHARYNGEAL MASS;  Surgeon: [name] Coop, DO;  Location: [facility];  Service: ENT;  Laterality: Left;  Frozen Section HPI: 87 year old female patient with diffuse large B-cell lymphoma of the left nasopharynx with a metastatic disease on chemotherapy. Pt seen in ED for dizziness early May 2022.  Had weight loss of 30 pounds in 2-3 months.  Mass noted on imaging.  Subsequently via ENT/surgical procedure, pt was diagnosed with cancer.  Imaging showing large  mass lesion occupying the left nasopharynx, immediately posterior to the left eustachian tube opening.  Pt has h/o GERD, hiatal hernia, anemia, memory impairment, HTN, HLD, arthritis.   She had reported to MD that she coughs some with intake but denied significant difficulties eating and masticates well.  Granddaughter informed MD that pt eats a small amount of intake.  Physical exam and endoscopy May 2022 showed normal palatal elevation and normal vocal cord motility as well as hypopharynx without aspiration. She did appear with moderate interarytenoid edema consistent with reflux per ENT.  Medications at that time included Norvasc, Lipitor, Maxzide,  MRI of brain showed encephalomalacia of anterior left temporal lobe suggests posttraumatic- nothing acute.  Subjective: pt awake in chair Assessment / Plan / Recommendation CHL IP CLINICAL IMPRESSIONS 06/26/2021 Clinical Impression Normal oropharyngeal swallow ability observed without aspiration or penetration of any consistency swallowed.  Pt's oral transiting was only minimally impaired with cracker bolus likely due to lack of upper dentition and only few anterior lower dentitions.  She had minimal oral residual of cracker that she independently transited and swallowed.  Pt consumed thin, nectar, pudding, cracker and barium tablet with thin liquid, self-feeding during the exam.  Second bolus of thin liquid needed to orally transit barium tablet but then readily cleared through pharynx without issues.  Of note, pt did NOT cough during testing nor complain of dysphagia.  Recommend she continue diet as tolerated. She reports encouragement with findings of today's testing.   Hopefully when pt obtains upper dentures, her mastication abilities and comfort will improve.   Given concern for potential reflux noted via May 2022 endoscopy due to arytenoid edema, SLP provided pt with esophageal precautions.  Thanks for this referral. No follow up indicated. SLP wrote tips for pt's son as he brought pt for testing but when tech went to get him for the test, he was not in the waiting area. SLP Visit Diagnosis Dysphagia, unspecified (R13.10) Attention and concentration deficit following -- Frontal lobe and executive function deficit following -- Impact on safety and function No limitations   No flowsheet data found.  No flowsheet data found. CHL IP DIET RECOMMENDATION 06/26/2021 SLP Diet Recommendations Regular solids;Dysphagia 3 (Mech soft) solids;Thin liquid Liquid Administration via Cup;Straw Medication Administration Whole meds with liquid Compensations Slow  rate;Small sips/bites Postural Changes Remain semi-upright after after feeds/meals (Comment);Seated upright at 90 degrees   CHL IP OTHER RECOMMENDATIONS 06/26/2021 Recommended Consults (No Data) Oral Care Recommendations -- Other Recommendations --   No flowsheet data found.  No flowsheet data found.     CHL IP ORAL PHASE 06/26/2021 Oral Phase WFL Oral - Pudding Teaspoon -- Oral - Pudding Cup -- Oral - Honey Teaspoon -- Oral - Honey Cup -- Oral - Nectar Teaspoon -- Oral - Nectar Cup -- Oral - Nectar Straw -- Oral - Thin Teaspoon -- Oral - Thin Cup -- Oral - Thin Straw -- Oral - Puree -- Oral - Mech Soft -- Oral - Regular -- Oral - Multi-Consistency -- Oral - Pill -- Oral Phase - Comment --  CHL IP PHARYNGEAL PHASE 06/26/2021 Pharyngeal Phase WFL Pharyngeal- Pudding Teaspoon -- Pharyngeal -- Pharyngeal- Pudding Cup -- Pharyngeal -- Pharyngeal- Honey Teaspoon -- Pharyngeal -- Pharyngeal- Honey Cup -- Pharyngeal -- Pharyngeal- Nectar Teaspoon -- Pharyngeal -- Pharyngeal- Nectar Cup -- Pharyngeal -- Pharyngeal- Nectar Straw -- Pharyngeal -- Pharyngeal- Thin Teaspoon -- Pharyngeal -- Pharyngeal- Thin Cup -- Pharyngeal -- Pharyngeal- Thin Straw -- Pharyngeal -- Pharyngeal- Puree -- Pharyngeal -- Pharyngeal- Mechanical  Soft -- Pharyngeal -- Pharyngeal- Regular -- Pharyngeal -- Pharyngeal- Multi-consistency -- Pharyngeal -- Pharyngeal- Pill -- Pharyngeal -- Pharyngeal Comment --  CHL IP CERVICAL ESOPHAGEAL PHASE 06/26/2021 Cervical Esophageal Phase WFL Pudding Teaspoon -- Pudding Cup -- Honey Teaspoon -- Honey Cup -- Nectar Teaspoon -- Nectar Cup -- Nectar Straw -- Thin Teaspoon -- Thin Cup -- Thin Straw -- Puree -- Mechanical Soft -- Regular -- Multi-consistency -- Pill -- Cervical Esophageal Comment -- Macario Golds 06/26/2021, 3:43 PM    Kathleen Lime, MS [place] Office [phone] Pager [phone]         CLINICAL DATA:  Dysphagia. EXAM: MODIFIED BARIUM SWALLOW TECHNIQUE: Different consistencies of barium  were administered orally to the patient by the Speech Pathologist. Imaging of the pharynx was performed in the lateral projection. The radiologist was present in the fluoroscopy room for this study, providing personal supervision. FLUOROSCOPY TIME:  Fluoroscopy Time:  1 minutes 18 seconds Radiation Exposure Index (if provided by the fluoroscopic device): Number of Acquired Spot Images: 15 COMPARISON:  None. FINDINGS: No aspiration was identified and multiple consistencies. Mild delay in oral transit was noted. Delayed swallowing trigger with nectar. IMPRESSION: Mild oral delay.  Mild delayed swallowing trigger.  No aspiration. Please refer to the Speech Pathologists report for complete details and recommendations. Electronically Signed   By: Franchot Gallo M.D.   On: 06/26/2021 14:20     All questions were answered. The patient knows to call the clinic with any problems, questions or concerns. I spent 30 minutes in the care of this patient including reviewing plan of care, labs, discussion about follow-up and treatment plan.   Benay Pike, MD 07/15/2021 11:29 AM

## 2021-07-15 NOTE — Assessment & Plan Note (Signed)
Hemoglobin of 9.2 g today.  No indication for transfusion.  We will continue to monitor this.

## 2021-07-17 ENCOUNTER — Other Ambulatory Visit: Payer: Self-pay

## 2021-07-17 ENCOUNTER — Inpatient Hospital Stay: Payer: Medicare HMO

## 2021-07-17 VITALS — BP 153/81 | HR 78 | Temp 97.9°F | Resp 16

## 2021-07-17 DIAGNOSIS — Z79899 Other long term (current) drug therapy: Secondary | ICD-10-CM | POA: Diagnosis not present

## 2021-07-17 DIAGNOSIS — Z5112 Encounter for antineoplastic immunotherapy: Secondary | ICD-10-CM | POA: Diagnosis not present

## 2021-07-17 DIAGNOSIS — Z5189 Encounter for other specified aftercare: Secondary | ICD-10-CM | POA: Diagnosis not present

## 2021-07-17 DIAGNOSIS — Z5111 Encounter for antineoplastic chemotherapy: Secondary | ICD-10-CM | POA: Diagnosis not present

## 2021-07-17 DIAGNOSIS — Z7901 Long term (current) use of anticoagulants: Secondary | ICD-10-CM | POA: Diagnosis not present

## 2021-07-17 DIAGNOSIS — C8331 Diffuse large B-cell lymphoma, lymph nodes of head, face, and neck: Secondary | ICD-10-CM | POA: Diagnosis not present

## 2021-07-17 DIAGNOSIS — I10 Essential (primary) hypertension: Secondary | ICD-10-CM | POA: Diagnosis not present

## 2021-07-17 DIAGNOSIS — C8330 Diffuse large B-cell lymphoma, unspecified site: Secondary | ICD-10-CM

## 2021-07-17 DIAGNOSIS — D649 Anemia, unspecified: Secondary | ICD-10-CM | POA: Diagnosis not present

## 2021-07-17 MED ORDER — PEGFILGRASTIM-CBQV 6 MG/0.6ML ~~LOC~~ SOSY
6.0000 mg | PREFILLED_SYRINGE | Freq: Once | SUBCUTANEOUS | Status: AC
  Administered 2021-07-17: 6 mg via SUBCUTANEOUS
  Filled 2021-07-17: qty 0.6

## 2021-07-21 ENCOUNTER — Other Ambulatory Visit: Payer: Self-pay | Admitting: Hematology and Oncology

## 2021-07-21 ENCOUNTER — Telehealth: Payer: Self-pay | Admitting: *Deleted

## 2021-07-21 DIAGNOSIS — C8330 Diffuse large B-cell lymphoma, unspecified site: Secondary | ICD-10-CM

## 2021-07-21 DIAGNOSIS — R7989 Other specified abnormal findings of blood chemistry: Secondary | ICD-10-CM

## 2021-07-21 NOTE — Telephone Encounter (Signed)
Referral paperwork faxed to [facility] per Dr. Rob Hickman request.  Copy of demographics, last office note, referral request and latest labwork faxed, fax confirmation received.

## 2021-07-30 ENCOUNTER — Other Ambulatory Visit: Payer: Self-pay

## 2021-07-30 ENCOUNTER — Ambulatory Visit (HOSPITAL_COMMUNITY)
Admission: RE | Admit: 2021-07-30 | Discharge: 2021-07-30 | Disposition: A | Discharge status: Home / Self Care | Payer: Medicare HMO | Source: Ambulatory Visit | Attending: Hematology and Oncology | Admitting: Hematology and Oncology

## 2021-07-30 DIAGNOSIS — I700 Atherosclerosis of aorta: Secondary | ICD-10-CM | POA: Insufficient documentation

## 2021-07-30 DIAGNOSIS — R160 Hepatomegaly, not elsewhere classified: Secondary | ICD-10-CM | POA: Diagnosis not present

## 2021-07-30 DIAGNOSIS — K7689 Other specified diseases of liver: Secondary | ICD-10-CM | POA: Diagnosis not present

## 2021-07-30 DIAGNOSIS — R609 Edema, unspecified: Secondary | ICD-10-CM | POA: Insufficient documentation

## 2021-07-30 DIAGNOSIS — C8330 Diffuse large B-cell lymphoma, unspecified site: Secondary | ICD-10-CM | POA: Diagnosis not present

## 2021-07-30 DIAGNOSIS — S2242XD Multiple fractures of ribs, left side, subsequent encounter for fracture with routine healing: Secondary | ICD-10-CM | POA: Diagnosis not present

## 2021-07-30 DIAGNOSIS — I2510 Atherosclerotic heart disease of native coronary artery without angina pectoris: Secondary | ICD-10-CM | POA: Diagnosis not present

## 2021-07-30 DIAGNOSIS — E049 Nontoxic goiter, unspecified: Secondary | ICD-10-CM | POA: Diagnosis not present

## 2021-07-30 LAB — GLUCOSE, CAPILLARY: Glucose-Capillary: 95 mg/dL (ref 70–99)

## 2021-07-30 MED ORDER — FLUDEOXYGLUCOSE F - 18 (FDG) INJECTION
5.5000 | Freq: Once | INTRAVENOUS | Status: AC
  Administered 2021-07-30: 5.4 via INTRAVENOUS

## 2021-08-05 ENCOUNTER — Inpatient Hospital Stay: Payer: Medicare HMO

## 2021-08-05 ENCOUNTER — Other Ambulatory Visit: Payer: Self-pay

## 2021-08-05 ENCOUNTER — Encounter: Payer: Self-pay | Admitting: Hematology and Oncology

## 2021-08-05 ENCOUNTER — Inpatient Hospital Stay: Payer: Medicare HMO | Attending: Hematology and Oncology

## 2021-08-05 ENCOUNTER — Inpatient Hospital Stay: Payer: Medicare HMO | Admitting: Hematology and Oncology

## 2021-08-05 VITALS — BP 143/85 | HR 89 | Temp 97.7°F | Resp 18

## 2021-08-05 VITALS — BP 138/73 | HR 77 | Temp 97.8°F | Resp 17 | Wt 114.2 lb

## 2021-08-05 DIAGNOSIS — Z5112 Encounter for antineoplastic immunotherapy: Secondary | ICD-10-CM | POA: Diagnosis not present

## 2021-08-05 DIAGNOSIS — C8330 Diffuse large B-cell lymphoma, unspecified site: Secondary | ICD-10-CM

## 2021-08-05 DIAGNOSIS — Z79899 Other long term (current) drug therapy: Secondary | ICD-10-CM | POA: Insufficient documentation

## 2021-08-05 DIAGNOSIS — Z5189 Encounter for other specified aftercare: Secondary | ICD-10-CM | POA: Diagnosis not present

## 2021-08-05 DIAGNOSIS — I10 Essential (primary) hypertension: Secondary | ICD-10-CM | POA: Insufficient documentation

## 2021-08-05 DIAGNOSIS — C8331 Diffuse large B-cell lymphoma, lymph nodes of head, face, and neck: Secondary | ICD-10-CM | POA: Diagnosis not present

## 2021-08-05 DIAGNOSIS — R634 Abnormal weight loss: Secondary | ICD-10-CM

## 2021-08-05 DIAGNOSIS — R7989 Other specified abnormal findings of blood chemistry: Secondary | ICD-10-CM | POA: Insufficient documentation

## 2021-08-05 DIAGNOSIS — D649 Anemia, unspecified: Secondary | ICD-10-CM

## 2021-08-05 DIAGNOSIS — Z95828 Presence of other vascular implants and grafts: Secondary | ICD-10-CM | POA: Insufficient documentation

## 2021-08-05 DIAGNOSIS — Z5111 Encounter for antineoplastic chemotherapy: Secondary | ICD-10-CM | POA: Diagnosis not present

## 2021-08-05 LAB — CMP (CANCER CENTER ONLY)
ALT: 24 U/L (ref 0–44)
AST: 23 U/L (ref 15–41)
Albumin: 3.6 g/dL (ref 3.5–5.0)
Alkaline Phosphatase: 173 U/L — ABNORMAL HIGH (ref 38–126)
Anion gap: 10 (ref 5–15)
BUN: 12 mg/dL (ref 8–23)
CO2: 24 mmol/L (ref 22–32)
Calcium: 9.2 mg/dL (ref 8.9–10.3)
Chloride: 108 mmol/L (ref 98–111)
Creatinine: 0.53 mg/dL (ref 0.44–1.00)
GFR, Estimated: >60 mL/min (ref >60)
Glucose, Bld: 88 mg/dL (ref 70–99)
Potassium: 3.7 mmol/L (ref 3.5–5.1)
Sodium: 142 mmol/L (ref 135–145)
Total Bilirubin: 0.3 mg/dL (ref 0.3–1.2)
Total Protein: 6.7 g/dL (ref 6.5–8.1)

## 2021-08-05 LAB — CBC WITH DIFFERENTIAL (CANCER CENTER ONLY)
Abs Immature Granulocytes: 0.05 10*3/uL (ref 0.00–0.07)
Basophils Absolute: 0 10*3/uL (ref 0.0–0.1)
Basophils Relative: 0 %
Eosinophils Absolute: 0 10*3/uL (ref 0.0–0.5)
Eosinophils Relative: 0 %
HCT: 27.5 % — ABNORMAL LOW (ref 36.0–46.0)
Hemoglobin: 8.9 g/dL — ABNORMAL LOW (ref 12.0–15.0)
Immature Granulocytes: 1 %
Lymphocytes Relative: 13 %
Lymphs Abs: 1.4 10*3/uL (ref 0.7–4.0)
MCH: 23.5 pg — ABNORMAL LOW (ref 26.0–34.0)
MCHC: 32.4 g/dL (ref 30.0–36.0)
MCV: 72.6 fL — ABNORMAL LOW (ref 80.0–100.0)
Monocytes Absolute: 0.4 10*3/uL (ref 0.1–1.0)
Monocytes Relative: 4 %
Neutro Abs: 8.7 10*3/uL — ABNORMAL HIGH (ref 1.7–7.7)
Neutrophils Relative %: 82 %
Platelet Count: 259 10*3/uL (ref 150–400)
RBC: 3.79 MIL/uL — ABNORMAL LOW (ref 3.87–5.11)
RDW: 18.1 % — ABNORMAL HIGH (ref 11.5–15.5)
WBC Count: 10.5 10*3/uL (ref 4.0–10.5)
nRBC: 0 % (ref 0.0–0.2)

## 2021-08-05 LAB — MAGNESIUM: Magnesium: 1.7 mg/dL (ref 1.7–2.4)

## 2021-08-05 LAB — PHOSPHORUS: Phosphorus: 3.1 mg/dL (ref 2.5–4.6)

## 2021-08-05 LAB — URIC ACID: Uric Acid, Serum: 2.8 mg/dL (ref 2.5–7.1)

## 2021-08-05 MED ORDER — APIXABAN 5 MG PO TABS
5.0000 mg | ORAL_TABLET | Freq: Two times a day (BID) | ORAL | 2 refills | Status: DC
Start: 2021-08-05 — End: 2021-11-26

## 2021-08-05 MED ORDER — SODIUM CHLORIDE 0.9% FLUSH
10.0000 mL | INTRAVENOUS | Status: DC | PRN
  Administered 2021-08-05: 10 mL

## 2021-08-05 MED ORDER — DEXAMETHASONE SODIUM PHOSPHATE 100 MG/10ML IJ SOLN
10.0000 mg | Freq: Once | INTRAMUSCULAR | Status: AC
  Administered 2021-08-05: 10 mg via INTRAVENOUS
  Filled 2021-08-05: qty 10

## 2021-08-05 MED ORDER — HEPARIN SOD (PORK) LOCK FLUSH 100 UNIT/ML IV SOLN
500.0000 [IU] | Freq: Once | INTRAVENOUS | Status: AC | PRN
  Administered 2021-08-05: 500 [IU]

## 2021-08-05 MED ORDER — SODIUM CHLORIDE 0.9 % IV SOLN
375.0000 mg/m2 | Freq: Once | INTRAVENOUS | Status: AC
  Administered 2021-08-05: 500 mg via INTRAVENOUS
  Filled 2021-08-05: qty 50

## 2021-08-05 MED ORDER — SODIUM CHLORIDE 0.9 % IV SOLN: Freq: Once | INTRAVENOUS | Status: AC

## 2021-08-05 MED ORDER — SODIUM CHLORIDE 0.9% FLUSH
10.0000 mL | Freq: Once | INTRAVENOUS | Status: AC
  Administered 2021-08-05: 10 mL

## 2021-08-05 MED ORDER — PALONOSETRON HCL INJECTION 0.25 MG/5ML
0.2500 mg | Freq: Once | INTRAVENOUS | Status: AC
  Administered 2021-08-05: 0.25 mg via INTRAVENOUS
  Filled 2021-08-05: qty 5

## 2021-08-05 MED ORDER — ACETAMINOPHEN 325 MG PO TABS
650.0000 mg | ORAL_TABLET | Freq: Once | ORAL | Status: AC
  Administered 2021-08-05: 650 mg via ORAL
  Filled 2021-08-05: qty 2

## 2021-08-05 MED ORDER — SODIUM CHLORIDE 0.9 % IV SOLN
150.0000 mg | Freq: Once | INTRAVENOUS | Status: AC
  Administered 2021-08-05: 150 mg via INTRAVENOUS
  Filled 2021-08-05: qty 150

## 2021-08-05 MED ORDER — VINCRISTINE SULFATE CHEMO INJECTION 1 MG/ML
1.0000 mg | Freq: Once | INTRAVENOUS | Status: AC
  Administered 2021-08-05: 1 mg via INTRAVENOUS
  Filled 2021-08-05: qty 1

## 2021-08-05 MED ORDER — SODIUM CHLORIDE 0.9 % IV SOLN
400.0000 mg/m2 | Freq: Once | INTRAVENOUS | Status: AC
  Administered 2021-08-05: 560 mg via INTRAVENOUS
  Filled 2021-08-05: qty 28

## 2021-08-05 MED ORDER — DIPHENHYDRAMINE HCL 25 MG PO CAPS
50.0000 mg | ORAL_CAPSULE | Freq: Once | ORAL | Status: AC
  Administered 2021-08-05: 50 mg via ORAL
  Filled 2021-08-05: qty 2

## 2021-08-05 MED ORDER — DOXORUBICIN HCL CHEMO IV INJECTION 2 MG/ML
25.0000 mg/m2 | Freq: Once | INTRAVENOUS | Status: AC
  Administered 2021-08-05: 36 mg via INTRAVENOUS
  Filled 2021-08-05: qty 18

## 2021-08-05 NOTE — Patient Instructions (Signed)
[name]  Discharge Instructions: Thank you for choosing [facility] to provide your oncology and hematology care.   If you have a lab appointment with the [facility], please go directly to the [facility] and check in at the registration area.   Wear comfortable clothing and clothing appropriate for easy access to any Portacath or PICC line.   We strive to give you quality time with your provider. You may need to reschedule your appointment if you arrive late (15 or more minutes).  Arriving late affects you and other patients whose appointments are after yours.  Also, if you miss three or more appointments without notifying the office, you may be dismissed from the clinic at the provider's discretion.      For prescription refill requests, have your pharmacy contact our office and allow 72 hours for refills to be completed.    Today you received the following chemotherapy and/or immunotherapy agents :Adriamycin, Vincristine, Cytoxan, Rituxan      To help prevent nausea and vomiting after your treatment, we encourage you to take your nausea medication as directed.  BELOW ARE SYMPTOMS THAT SHOULD BE REPORTED IMMEDIATELY: *FEVER GREATER THAN 100.4 F (38 C) OR HIGHER *CHILLS OR SWEATING *NAUSEA AND VOMITING THAT IS NOT CONTROLLED WITH YOUR NAUSEA MEDICATION *UNUSUAL SHORTNESS OF BREATH *UNUSUAL BRUISING OR BLEEDING *URINARY PROBLEMS (pain or burning when urinating, or frequent urination) *BOWEL PROBLEMS (unusual diarrhea, constipation, pain near the anus) TENDERNESS IN MOUTH AND THROAT WITH OR WITHOUT PRESENCE OF ULCERS (sore throat, sores in mouth, or a toothache) UNUSUAL RASH, SWELLING OR PAIN  UNUSUAL VAGINAL DISCHARGE OR ITCHING   Items with * indicate a potential emergency and should be followed up as soon as possible or go to the Emergency Department if any problems should occur.  Please show the CHEMOTHERAPY ALERT CARD or  IMMUNOTHERAPY ALERT CARD at check-in to the Emergency Department and triage nurse.  Should you have questions after your visit or need to cancel or reschedule your appointment, please contact [facility]  Dept: [phone]  and follow the prompts.  Office hours are 8:00 a.m. to 4:30 p.m. Monday - Friday. Please note that voicemails left after 4:00 p.m. may not be returned until the following business day.  We are closed weekends and major holidays. You have access to a nurse at all times for urgent questions. Please call the main number to the clinic Dept: [phone] and follow the prompts.   For any non-urgent questions, you may also contact your provider using MyChart. We now offer e-Visits for anyone 57 and older to request care online for non-urgent symptoms. For details visit [email or web address].   Also download the MyChart app! Go to the app store, search "MyChart", open the app, select [facility], and log in with your MyChart username and password.  Due to Covid, a mask is required upon entering the hospital/clinic. If you do not have a mask, one will be given to you upon arrival. For doctor visits, patients may have 1 support person aged 51 or older with them. For treatment visits, patients cannot have anyone with them due to current Covid guidelines and our immunocompromised population.

## 2021-08-05 NOTE — Assessment & Plan Note (Signed)
This is likely related to antineoplastic chemotherapy.  No indication for transfusion today.  We will continue to monitor.

## 2021-08-05 NOTE — Progress Notes (Signed)
[facility] CONSULT NOTE  Patient Care Team: [name] Contras, MD as PCP - General (Family Medicine)  CHIEF COMPLAINTS/PURPOSE OF CONSULTATION:  DLBCL of nasopharynx.  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  She has been doing quite well since initiation of chemotherapy.  No major toxicities to report. Physical examination quite unremarkable except for some bilateral lower extremity swelling which has been stable since last visit. I have reviewed his CBC and CMP, labs okay to proceed with treatment as planned. She had an interim PET/CT after 3 cycles which showed remarkable response.  Plan is to continue 6 cycles of R mini CHOP and consider intrathecal methotrexate prophylaxis at the end of the treatment.  Normocytic normochromic anemia This is likely related to antineoplastic chemotherapy.  No indication for transfusion today.  We will continue to monitor.  Weight loss, unintentional She has gained 2 pounds since last visit.  This is very encouraging.  According to family, she has been eating well and has no current B symptoms.  We will continue to monitor her weight.  Abnormal TSH Abnormal TSH noted.  We will add free T4 and some additional tumor lysis labs to today's labs.  Orders Placed This Encounter  Procedures   T4, free    Standing Status:   Standing    Number of Occurrences:   22    Standing Expiration Date:   08/05/2022   Magnesium    Standing Status:   Future    Standing Expiration Date:   08/05/2022   Uric acid    Standing Status:   Future    Standing Expiration Date:   08/05/2022   Phosphorus    Standing Status:   Future    Standing Expiration Date:   08/05/2022   Although she might  benefit from CNS prophylaxis, she is a very high risk for methotrexate infusions given her age and performance status.  we will monitor closely and if she continues to do well at the end of the treatment can consider intrathecal methotrexate.  I have discussed this today with her son and the patient.  We will consider this approach once she finishes planned 6 cycles of R mini CHOP.  HISTORY OF PRESENTING ILLNESS:   [name] 87 y.o. female is here because of new diagnosis of DLBCL of nasopharynx.  Ms. [name] is a very pleasant 87 year old female patient who arrived today with her granddaughter Ms. [name] to the appointment.  She does not quite remember things as explained and has short-term memory loss.  After a bit of the conversation, she did admit to me that she was told about a cancer in her nose which was biopsied last week but she cannot remember the details.  Ms. [name] mentioned that for the past year or so grandma has been having more nasal drainage and has progressively lost weight in the past couple months of at least 20 pounds, has become relatively more sedentary and inactive, has no appetite.  They initially thought she might be having a stroke when she was not getting out of bed for 3days and has not been eating.  When she was in the ER.  She had some imaging CT head without contrast which  showed left maxillary and ethmoid sinus disease. She then had MRI brain on the same day which showed 3.2 x 2 x 3.5 cm heterogeneous mass positioned at the left nasopharynx near the fossa of Rosenmuller.  Finding is most concerning for a primary nasopharyngeal neoplasm.  ENT consultation was recommended.  She then went on to see [name].She had nasal endoscopy with biopsy of left nasopharyngeal mass.  According to [name], polypoid mass lesion was noted to be lymphoma from the posterior aspect of the left middle turbinate.  Pathology showed findings consistent with diffuse large B-cell lymphoma  of the left nasopharyngeal mass as well as left middle turbinate.   PET/CT showed hypermetabolic left nasopharyngeal mass, no associated neck adenopathy, 2 large hypermetabolic liver lesions, destructive lytic metastatic bone lesions.  Since there was no lymphadenopathy and no unusual appearance of spleen, second biopsy was recommended to confirm the diagnosis.  Left ilium  Biopsy showed findings consistent with involvement by non-Hodgkin B-cell lymphoma.  Features are similar to previously known large B-cell lymphoma consistent with involvement by the same disease process.  Cycle day 1 of R mini CHOP on 06/03/2021. C2D1 06/23/2021 Cycle 3-day 1 completed on July 15, 2021 She had an interim PET/CT which showed remarkable response.  Interval History  She is here for follow-up with her son.   Since last visit, she denies any new complaints.  No more falls.  No nausea or vomiting.  She had headache for 1 day but this was not very bothersome.  Her bilateral lower extremity swelling is stable.  She does not wear compression stockings usually. No change in breathing, bowel habits or urinary habits.  No neuropathy.  She has been compliant with all medications Rest of the pertinent 10 point ROS reviewed and negative.  MEDICAL HISTORY:  Past Medical History:  Diagnosis Date   Anemia    Arthritis    knees    GERD (gastroesophageal reflux disease)    Hiatal hernia    History of inguinal hernia repair    BIH   Hyperlipidemia    Hypertension    Memory problem     SURGICAL HISTORY: Past Surgical History:  Procedure Laterality Date   COLONOSCOPY     HERNIA REPAIR  10/13/11   lap BIH rep w/ mesh- Dr. Tora [name] HERNIA REPAIR  10/13/2011   Procedure: LAPAROSCOPIC INGUINAL HERNIA;  Surgeon: [name] Hector, MD;  Location: [facility];  Service: General;  Laterality: Bilateral;  Laparoscopic bilateral inguinal hernia repair, converted to open bilateral inguinal hernia repairs with mesh   IR  IMAGING GUIDED PORT INSERTION  05/06/2021   NASAL ENDOSCOPY Left 04/08/2021   Procedure: NASAL ENDOSCOPY WITH BIOPSY OF LEFT NASOPPHARYNGEAL MASS;  Surgeon: [name] Coop, DO;  Location: [facility];  Service: ENT;  Laterality: Left;  Frozen Section    SOCIAL HISTORY: Social History   Socioeconomic History   Marital status: Single    Spouse name: Not on file   Number of children: Not on file   Years of education: Not on file   Highest education level: Not on file  Occupational History   Not on file  Tobacco Use   Smoking status: Never   Smokeless tobacco: Never  Vaping Use   Vaping Use: Never used  Substance and Sexual Activity   Alcohol use: No   Drug use: No   Sexual activity: Not on file  Other Topics Concern   Not on file  Social History Narrative  Not on file   Social Determinants of Health   Financial Resource Strain: Not on file  Food Insecurity: Not on file  Transportation Needs: Not on file  Physical Activity: Not on file  Stress: Not on file  Social Connections: Not on file  Intimate Partner Violence: Not on file    FAMILY HISTORY: Family History  Problem Relation Age of Onset   Heart disease Mother     ALLERGIES:  has No Known Allergies.  MEDICATIONS:  Current Outpatient Medications  Medication Sig Dispense Refill   acetaminophen (TYLENOL) 500 MG tablet Take 1,000 mg by mouth every 6 (six) hours as needed for mild pain or fever.     allopurinol (ZYLOPRIM) 300 MG tablet Take 1 tablet (300 mg total) by mouth daily. 30 tablet 0   amLODipine (NORVASC) 10 MG tablet Take 10 mg by mouth daily.     atorvastatin (LIPITOR) 80 MG tablet Take 80 mg by mouth every morning.     ezetimibe (ZETIA) 10 MG tablet Take 10 mg by mouth every morning.     HYDROcodone-acetaminophen (NORCO/VICODIN) 5-325 MG tablet Take 1 tablet by mouth every 4 (four) hours as needed. 15 tablet 0   lidocaine-prilocaine (EMLA) cream Apply to affected area once 30 g 3   LORazepam (ATIVAN)  0.5 MG tablet Take 1 tablet (0.5 mg total) by mouth every 6 (six) hours as needed (Nausea or vomiting). 30 tablet 0   memantine (NAMENDA) 10 MG tablet Take 1/2 tablet (5 mg) for 1 week at bedtime, then 1 tablet (10 mg) at bedtime. 27 tablet 0   memantine (NAMENDA) 10 MG tablet Take 1 tablet (10 mg total) by mouth at bedtime. 90 tablet 1   ondansetron (ZOFRAN) 8 MG tablet Take 1 tablet (8 mg total) by mouth 2 (two) times daily as needed for refractory nausea / vomiting. Start on day 3 after cyclophosphamide chemotherapy. 30 tablet 1   predniSONE (DELTASONE) 20 MG tablet Take 3 tablets (60 mg total) by mouth daily. Take with food on days 1-5 of chemotherapy. 15 tablet 5   prochlorperazine (COMPAZINE) 10 MG tablet Take 1 tablet (10 mg total) by mouth every 6 (six) hours as needed (Nausea or vomiting). 30 tablet 6   apixaban (ELIQUIS) 5 MG TABS tablet Take 1 tablet (5 mg total) by mouth 2 (two) times daily. 60 tablet 2   No current facility-administered medications for this visit.    PHYSICAL EXAMINATION:  ECOG PERFORMANCE STATUS: 1/2  Physical Exam Constitutional:      Appearance: Normal appearance.  HENT:     Head: Normocephalic and atraumatic.  Eyes:     Pupils: Pupils are equal, round, and reactive to light.  Cardiovascular:     Rate and Rhythm: Normal rate and regular rhythm.     Pulses: Normal pulses.     Heart sounds: Normal heart sounds.  Pulmonary:     Effort: Pulmonary effort is normal.     Breath sounds: Normal breath sounds.  Abdominal:     General: Abdomen is flat. Bowel sounds are normal.     Palpations: Abdomen is soft.  Musculoskeletal:        General: Swelling (Bilateral lower extremity swelling stable since last visit) present. No tenderness.     Cervical back: Normal range of motion and neck supple. No rigidity.  Lymphadenopathy:     Cervical: No cervical adenopathy.  Skin:    General: Skin is warm and dry.  Neurological:     General: No focal  deficit present.      Mental Status: She is alert.  Psychiatric:        Mood and Affect: Mood normal.     LABORATORY DATA:  I have reviewed the data as listed Lab Results  Component Value Date   WBC 10.5 08/05/2021   HGB 8.9 (L) 08/05/2021   HCT 27.5 (L) 08/05/2021   MCV 72.6 (L) 08/05/2021   PLT 259 08/05/2021     Chemistry      Component Value Date/Time   NA 142 08/05/2021 0856   K 3.7 08/05/2021 0856   CL 108 08/05/2021 0856   CO2 24 08/05/2021 0856   BUN 12 08/05/2021 0856   CREATININE 0.53 08/05/2021 0856      Component Value Date/Time   CALCIUM 9.2 08/05/2021 0856   ALKPHOS 173 (H) 08/05/2021 0856   AST 23 08/05/2021 0856   ALT 24 08/05/2021 0856   BILITOT 0.3 08/05/2021 0856      RADIOGRAPHIC STUDIES: I have personally reviewed the radiological images as listed and agreed with the findings in the report. NM PET Image Initial (PI) Skull Base To Thigh  Result Date: 07/31/2021 CLINICAL DATA:  Subsequent treatment strategy for diffuse B-cell lymphoma. EXAM: NUCLEAR MEDICINE PET SKULL BASE TO THIGH TECHNIQUE: 5.4 mCi F-18 FDG was injected intravenously. Full-ring PET imaging was performed from the skull base to thigh after the radiotracer. CT data was obtained and used for attenuation correction and anatomic localization. Fasting blood glucose: 95 mg/dl COMPARISON:  Multiple exams, including 04/28/2021 FINDINGS: Mediastinal blood pool activity: SUV max 1.4 Liver activity: SUV max 1.9 NECK: Substantial reduction in size and activity of the left posterior nasopharyngeal mass, previously 4.2 by 2.4 cm with maximum SUV of 10.2 and currently 2.0 by 1.0 cm on image 15 of series 4 with maximum SUV 4.9 (Deauville 5). Incidental CT findings: Improved chronic left maxillary sinusitis. Bilateral common carotid atherosclerotic calcification. Stable enlargement and diffuse heterogeneity of the thyroid gland without hypermetabolic activity. CHEST: Index left axillary node measures 0.5 cm in short axis on  image 53 of series 4 with maximum SUV 1.2, previously measuring 0.7 cm in short axis with maximum SUV of 2.3. Incidental CT findings: Coronary, aortic arch, and branch vessel atherosclerotic vascular disease. Stable mild bibasilar scarring. Subtle reticulonodular opacity in the lingula on image 34 series 8 appears stable and does not have appreciable hypermetabolic activity. ABDOMEN/PELVIS: Previous hypermetabolic liver lesions are currently hypodense and mildly photopenic. For example, the larger the 2 previous dominant hypermetabolic liver lesions has corresponding hypodensity measuring 2.5 by 2.8 cm on image 85 series 4 and metabolic activity of 1.7 (Deauville 3), previously 6.3. Currently no hypermetabolic liver lesion is observed. Activity just proximal to the right iliac crossover is ascribed to ureteral excretion of radiopharmaceutical. Incidental CT findings: Atherosclerosis is present, including aortoiliac atherosclerotic disease. Suspected small uterine fibroids. Mild diffuse subcutaneous edema. SKELETON: The numerous previous scattered hypermetabolic lesions throughout the axial and appendicular skeleton no longer demonstrate focal hypermetabolic activity, with corresponding lesions reduced in density compared to previous. The index right iliac bone lesion has a precontrast internal density of 21 Hounsfield units (formerly 38 Hounsfield units) and including intraosseous and extraosseous component measures about 2.9 by 2.0 cm (formerly 2.8 by 2.3 cm), with representative internal SUV of 1.8 (Deauville 3), formerly 7.7. Likewise the other scattered bony lesions are similarly substantially reduced in activity compared to previous. There is some diffuse accentuated activity throughout the skeleton possibly related to granulocyte stimulation. Against this  background many of the previous scattered bony lesions are no longer readily apparent. Incidental CT findings: Lucencies related to the prior bony lesions  are observed. Prominent bilateral degenerative glenohumeral arthropathy. Healing left anterior third rib fracture, new compared to 04/28/2021. IMPRESSION: 1. Substantial improvement, with notably reduced size and activity of the left posterior nasopharyngeal mass as well as reduced density and substantially reduced activity of the scattered osseous lesions and of the liver lesions. The posterior nasopharyngeal mass is still Deauville 5, although substantially reduced in activity; the index hepatic and bony lesions are currently Deauville 3. 2. Background diffuse accentuated bony activity, query granulocyte stimulation. 3. Stable enlarged somewhat heterogeneous thyroid gland. In the setting of significant comorbidities or limited life expectancy, no follow-up recommended (ref: J Am Coll Radiol. 2015 Feb;12(2): 143-50). 4. Other imaging findings of potential clinical significance: Improved chronic left maxillary sinusitis. Healing left anterior third rib fracture, new compared to 04/28/2021. Aortic Atherosclerosis (ICD10-I70.0). Coronary atherosclerosis. Mild diffuse subcutaneous edema. Electronically Signed   By: Van Clines M.D.   On: 07/31/2021 07:51    I have reviewed the pet imaging results with the patient and compared this to the imaging from June 2022. All questions were answered. The patient knows to call the clinic with any problems, questions or concerns.  I spent 30 minutes in the care of this patient including reviewing plan of care, labs, discussion about follow-up and treatment plan.   Benay Pike, MD 08/05/2021 10:18 AM

## 2021-08-05 NOTE — Assessment & Plan Note (Signed)
Abnormal TSH noted.  We will add free T4 and some additional tumor lysis labs to today's labs.

## 2021-08-05 NOTE — Assessment & Plan Note (Signed)
She has gained 2 pounds since last visit.  This is very encouraging.  According to family, she has been eating well and has no current B symptoms.  We will continue to monitor her weight.

## 2021-08-05 NOTE — Assessment & Plan Note (Signed)
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  She has been doing quite well since initiation of chemotherapy.  No major toxicities to report. Physical examination quite unremarkable except for some bilateral lower extremity swelling which has been stable since last visit. I have reviewed his CBC and CMP, labs okay to proceed with treatment as planned. She had an interim PET/CT after 3 cycles which showed remarkable response.  Plan is to continue 6 cycles of R mini CHOP and consider intrathecal methotrexate prophylaxis at the end of the treatment.

## 2021-08-07 ENCOUNTER — Inpatient Hospital Stay: Payer: Medicare HMO

## 2021-08-07 ENCOUNTER — Other Ambulatory Visit: Payer: Self-pay

## 2021-08-07 VITALS — BP 122/61 | HR 76 | Temp 98.6°F | Resp 17

## 2021-08-07 DIAGNOSIS — Z5112 Encounter for antineoplastic immunotherapy: Secondary | ICD-10-CM | POA: Diagnosis not present

## 2021-08-07 DIAGNOSIS — C8331 Diffuse large B-cell lymphoma, lymph nodes of head, face, and neck: Secondary | ICD-10-CM | POA: Diagnosis not present

## 2021-08-07 DIAGNOSIS — Z5189 Encounter for other specified aftercare: Secondary | ICD-10-CM | POA: Diagnosis not present

## 2021-08-07 DIAGNOSIS — Z5111 Encounter for antineoplastic chemotherapy: Secondary | ICD-10-CM | POA: Diagnosis not present

## 2021-08-07 DIAGNOSIS — I10 Essential (primary) hypertension: Secondary | ICD-10-CM | POA: Diagnosis not present

## 2021-08-07 DIAGNOSIS — C8330 Diffuse large B-cell lymphoma, unspecified site: Secondary | ICD-10-CM

## 2021-08-07 DIAGNOSIS — D649 Anemia, unspecified: Secondary | ICD-10-CM | POA: Diagnosis not present

## 2021-08-07 DIAGNOSIS — Z79899 Other long term (current) drug therapy: Secondary | ICD-10-CM | POA: Diagnosis not present

## 2021-08-07 MED ORDER — PEGFILGRASTIM-CBQV 6 MG/0.6ML ~~LOC~~ SOSY
6.0000 mg | PREFILLED_SYRINGE | Freq: Once | SUBCUTANEOUS | Status: AC
  Administered 2021-08-07: 6 mg via SUBCUTANEOUS
  Filled 2021-08-07: qty 0.6

## 2021-08-07 NOTE — Patient Instructions (Signed)
Pegfilgrastim injection What is this medication? PEGFILGRASTIM (PEG fil gra stim) is a Berton-acting granulocyte colony-stimulating factor that stimulates the growth of neutrophils, a type of white blood cell important in the body's fight against infection. It is used to reduce the incidence of fever and infection in patients with certain types of cancer who are receiving chemotherapy that affects the bone marrow, and to increase survival after being exposed to high doses of radiation. This medicine may be used for other purposes; ask your health care provider or pharmacist if you have questions. COMMON BRAND NAME(S): Fulphila, Neulasta, Nyvepria, UDENYCA, Ziextenzo What should I tell my care team before I take this medication? They need to know if you have any of these conditions: kidney disease latex allergy ongoing radiation therapy sickle cell disease skin reactions to acrylic adhesives (On-Body Injector only) an unusual or allergic reaction to pegfilgrastim, filgrastim, other medicines, foods, dyes, or preservatives pregnant or trying to get pregnant breast-feeding How should I use this medication? This medicine is for injection under the skin. If you get this medicine at home, you will be taught how to prepare and give the pre-filled syringe or how to use the On-body Injector. Refer to the patient Instructions for Use for detailed instructions. Use exactly as directed. Tell your healthcare provider immediately if you suspect that the On-body Injector may not have performed as intended or if you suspect the use of the On-body Injector resulted in a missed or partial dose. It is important that you put your used needles and syringes in a special sharps container. Do not put them in a trash can. If you do not have a sharps container, call your pharmacist or healthcare provider to get one. Talk to your pediatrician regarding the use of this medicine in children. While this drug may be prescribed for  selected conditions, precautions do apply. Overdosage: If you think you have taken too much of this medicine contact a poison control center or emergency room at once. NOTE: This medicine is only for you. Do not share this medicine with others. What if I miss a dose? It is important not to miss your dose. Call your doctor or health care professional if you miss your dose. If you miss a dose due to an On-body Injector failure or leakage, a new dose should be administered as soon as possible using a single prefilled syringe for manual use. What may interact with this medication? Interactions have not been studied. This list may not describe all possible interactions. Give your health care provider a list of all the medicines, herbs, non-prescription drugs, or dietary supplements you use. Also tell them if you smoke, drink alcohol, or use illegal drugs. Some items may interact with your medicine. What should I watch for while using this medication? Your condition will be monitored carefully while you are receiving this medicine. You may need blood work done while you are taking this medicine. Talk to your health care provider about your risk of cancer. You may be more at risk for certain types of cancer if you take this medicine. If you are going to need a MRI, CT scan, or other procedure, tell your doctor that you are using this medicine (On-Body Injector only). What side effects may I notice from receiving this medication? Side effects that you should report to your doctor or health care professional as soon as possible: allergic reactions (skin rash, itching or hives, swelling of the face, lips, or tongue) back pain dizziness fever pain,   redness, or irritation at site where injected pinpoint red spots on the skin red or dark-brown urine shortness of breath or breathing problems stomach or side pain, or pain at the shoulder swelling tiredness trouble passing urine or change in the amount of  urine unusual bruising or bleeding Side effects that usually do not require medical attention (report to your doctor or health care professional if they continue or are bothersome): bone pain muscle pain This list may not describe all possible side effects. Call your doctor for medical advice about side effects. You may report side effects to FDA at 1-800-FDA-1088. Where should I keep my medication? Keep out of the reach of children. If you are using this medicine at home, you will be instructed on how to store it. Throw away any unused medicine after the expiration date on the label. NOTE: This sheet is a summary. It may not cover all possible information. If you have questions about this medicine, talk to your doctor, pharmacist, or health care provider.  2022 Elsevier/Gold Standard (2020-12-05 11:54:14)  

## 2021-08-11 IMAGING — CT CT ABD-PELV W/ CM
2 of 5 series · 16 of 46 positions shown, 18 images · IV contrast (APPLIED)
Comparison: PET CT 04/28/2021

CLINICAL DATA: Abdominal abscess

EXAM:
CT ABDOMEN AND PELVIS WITH CONTRAST
TECHNIQUE: Multidetector CT imaging of the abdomen and pelvis was performed
using the standard protocol following bolus administration of
intravenous contrast.
CONTRAST:  60mL OMNIPAQUE IOHEXOL 300 MG/ML  SOLN

[Series 2: abd pel w · axial · 0.72mm/px · z∈[-761,-456]mm · 13 of 69 slices shown, 15 images]
[im 4/69  soft-tissue]
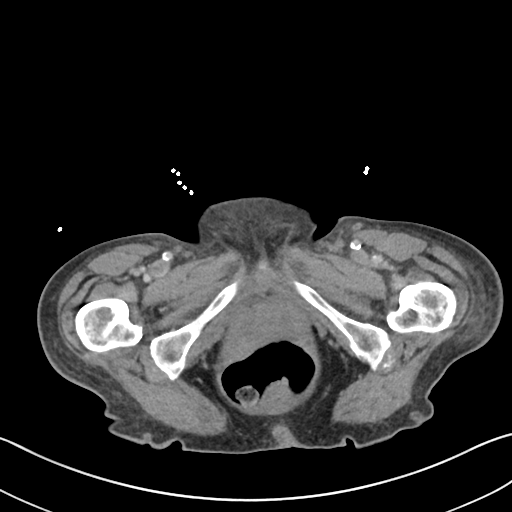
[im 4/69  bone]
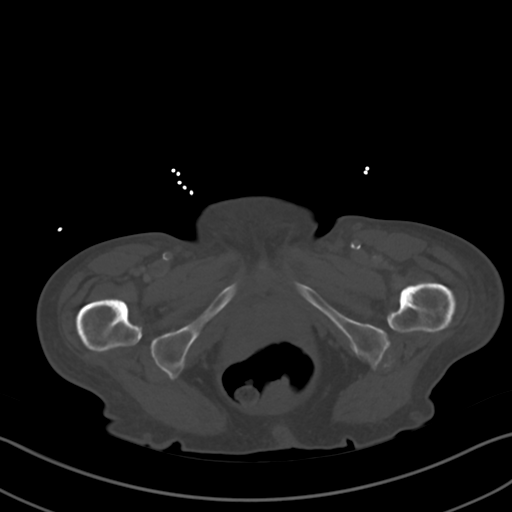
[im 8/69  soft-tissue]
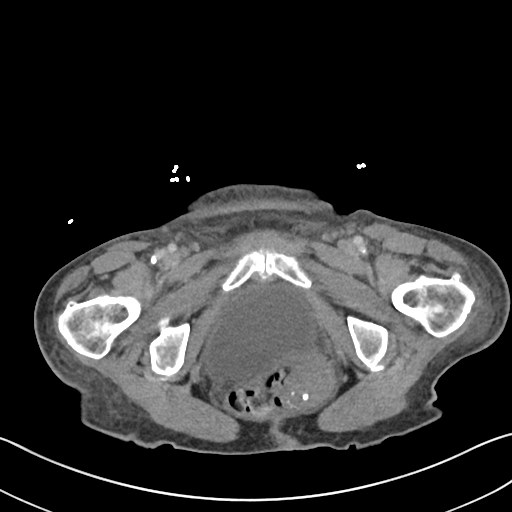
[im 16/69  soft-tissue]
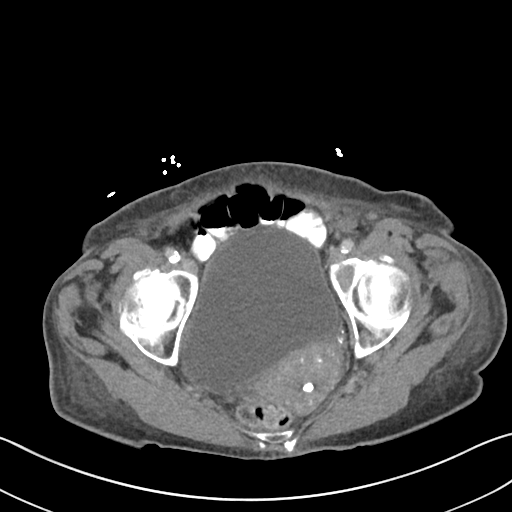
[im 19/69  soft-tissue]
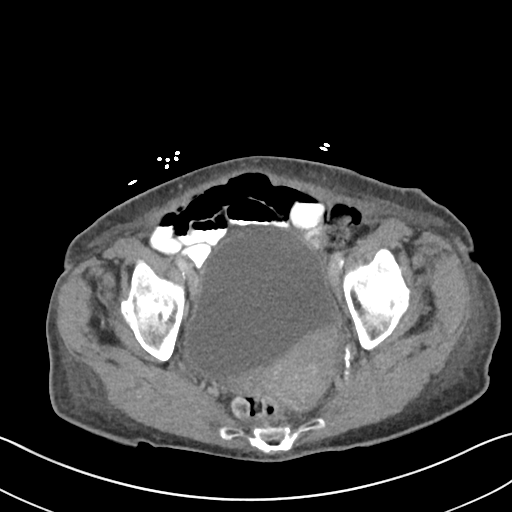
[im 23/69  soft-tissue]
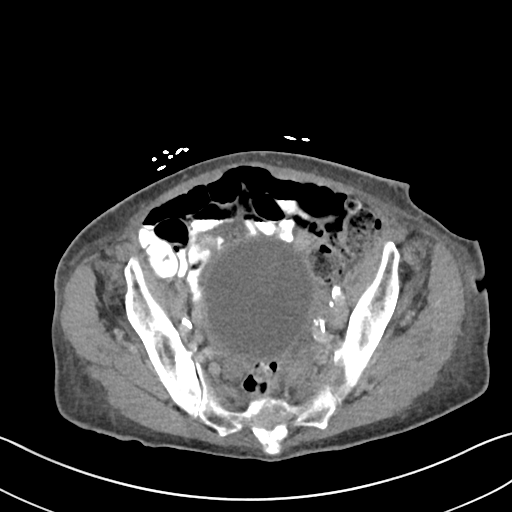
[im 31/69  soft-tissue]
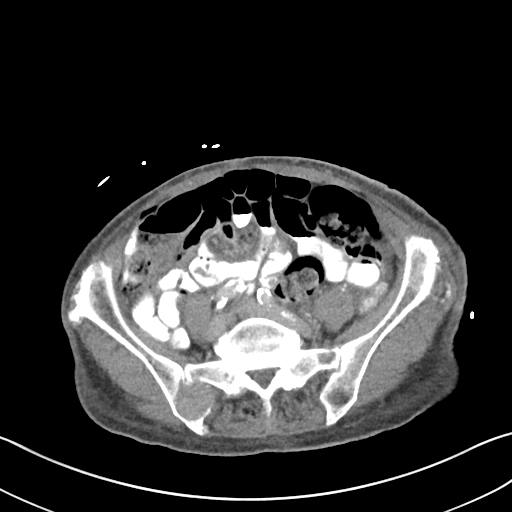
[im 35/69  soft-tissue]
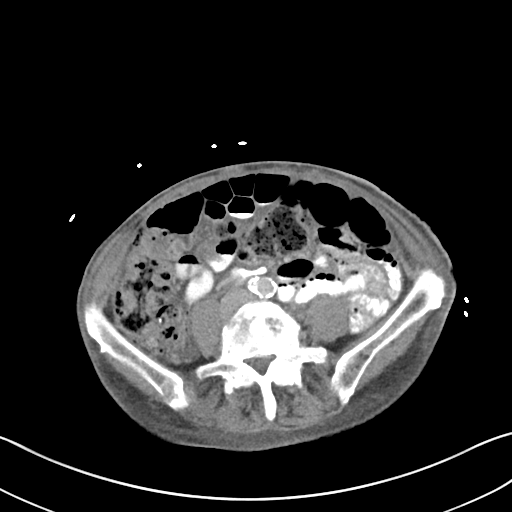
[im 38/69  soft-tissue]
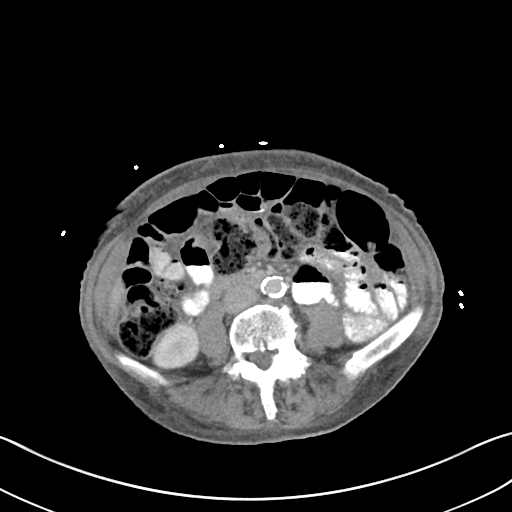
[im 46/69  soft-tissue]
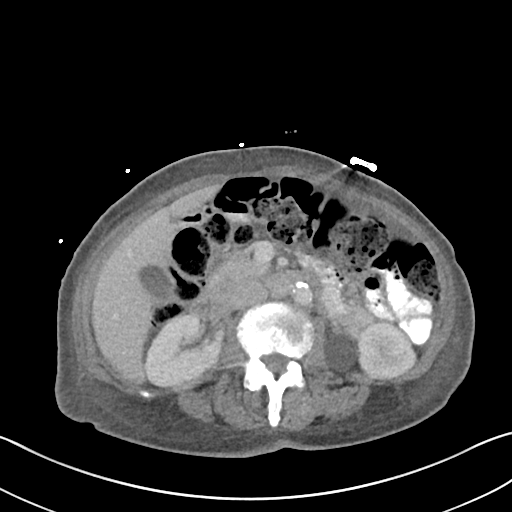
[im 46/69  bone]
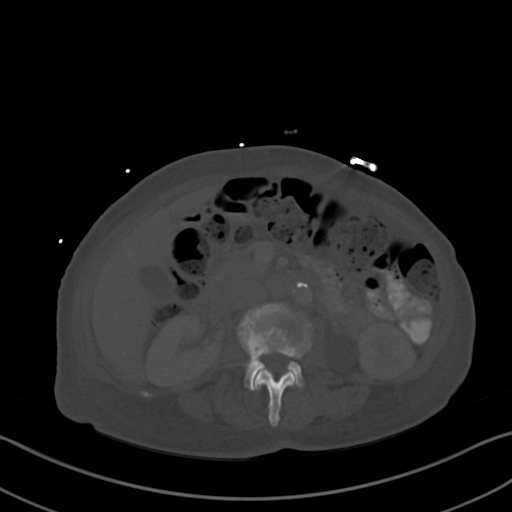
[im 50/69  soft-tissue]
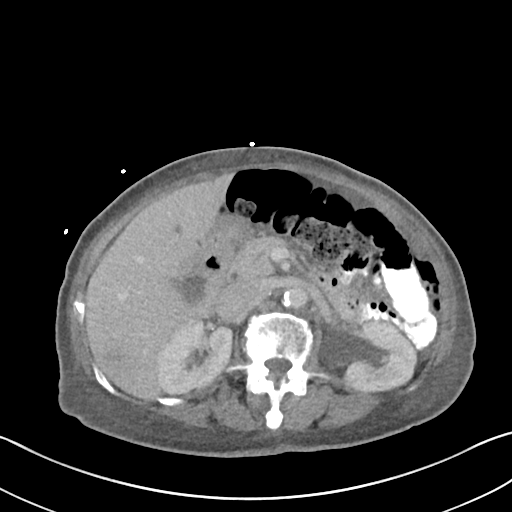
[im 53/69  soft-tissue]
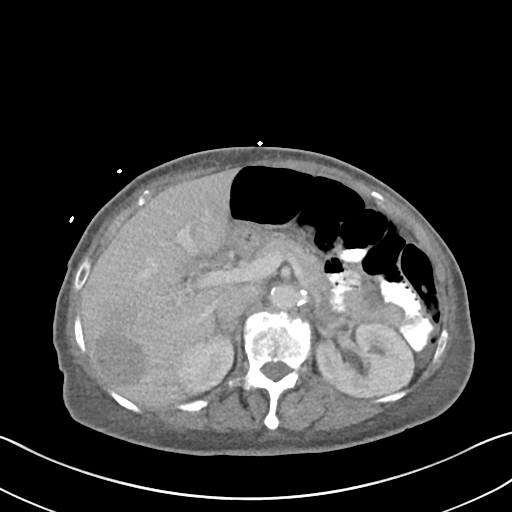
[im 61/69  soft-tissue]
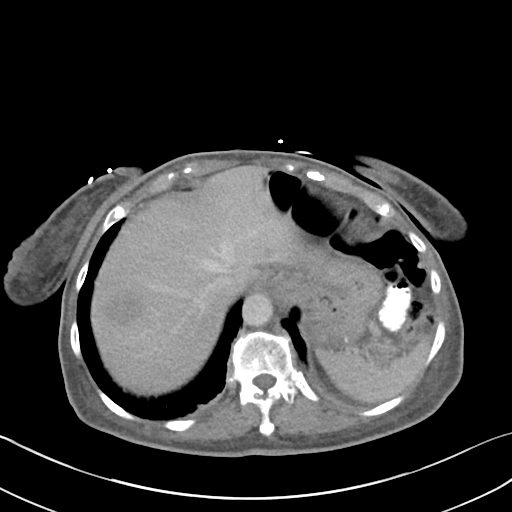
[im 65/69  soft-tissue]
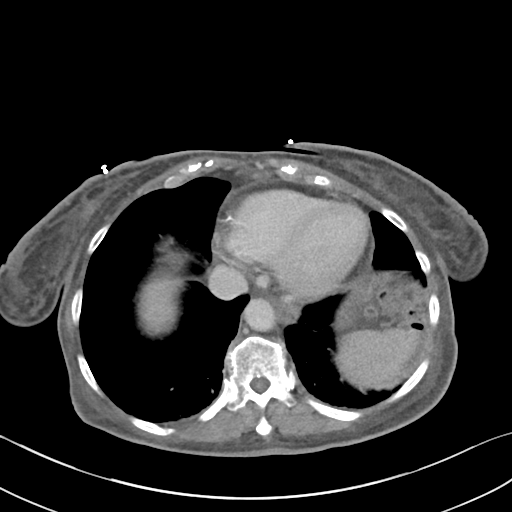

[Series 5: coronal · coronal · 0.67mm/px · 3 of 74 slices shown]
[im 25/74  soft-tissue]
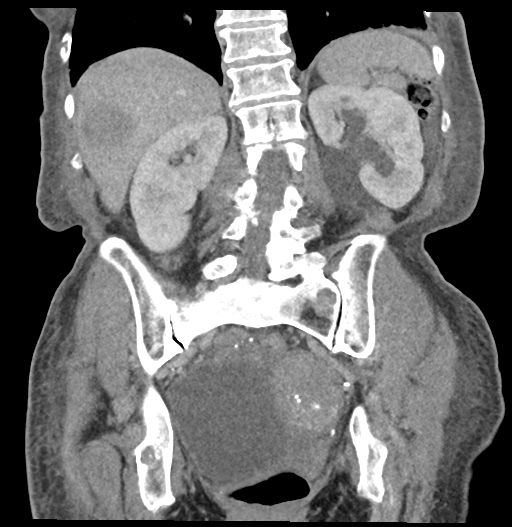
[im 33/74  soft-tissue]
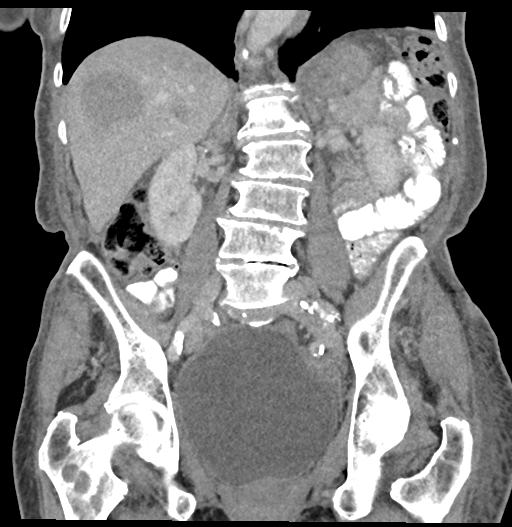
[im 41/74  soft-tissue]
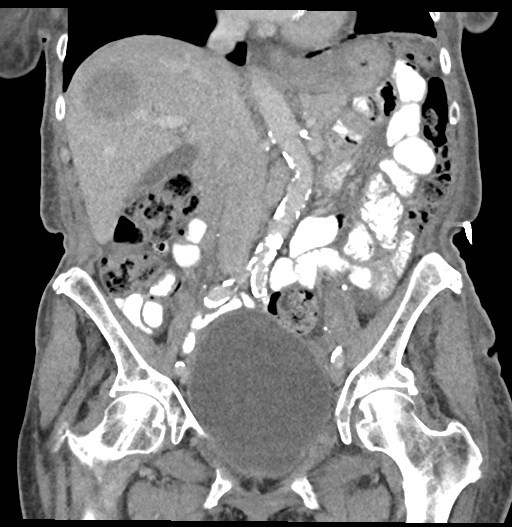

[16 of 46 positions shown; findings below may reference images not displayed]

FINDINGS: Lower chest: The visualized lung bases are clear bilaterally.
Extensive multi-vessel coronary artery calcification. Global cardiac
size within normal limits.

Hepatobiliary: There are 3 hypoechoic masses identified within the
liver corresponding to the hypermetabolic lesion seen on prior PET
CT examination measuring up to 4.4 x 4.9 cm, unchanged from prior
examination. The third lesion identified within the central aspect
of segment 4 B is better visualized on the current exam. There are,
additionally, at least 3 additional indeterminate subcentimeter
hypoechoic masses identified within the right hepatic lobe which may
represent additional foci of metastatic disease, but are
indeterminate. No intra or extrahepatic biliary ductal dilation.
Gallbladder unremarkable.

Pancreas: Unremarkable

Spleen: Unremarkable

Adrenals/Urinary Tract: The adrenal glands are unremarkable. The
kidneys are normal in size and position. There is mild left
hydronephrosis and dilation of the left renal pelvis with
decompression of the left ureter in keeping with a probable mild
left UPJ obstruction. No intrarenal or ureteral calculi. No
enhancing intrarenal masses. No hydronephrosis on the right. The
bladder is mildly distended, but is otherwise unremarkable.

Stomach/Bowel: The stomach, large bowel, and small bowel are
unremarkable. No evidence of obstruction or focal inflammation. No
free intraperitoneal gas or fluid.

Vascular/Lymphatic: There is extensive aortoiliac atherosclerotic
calcification. No aortic aneurysm. No pathologic adenopathy within
the abdomen and pelvis.

Reproductive: Uterus and bilateral adnexa are unremarkable.

Other: Mild diffuse subcutaneous body wall edema is in keeping with
mild anasarca. No abdominal wall hernia.

Musculoskeletal: Multiple lytic lesions are identified within the
visualized axial skeleton, better visualized on prior PET CT
examination, with the largest seen within the right iliac spine
measuring 3.2 x 2.8 cm at axial image # 38/2.
IMPRESSION: Finding similar to prior PET CT examination with multiple hepatic
masses and multiple osseous lytic lesions compatible with metastatic
foci of unknown primary. As noted previously, this would be an
unusual presentation for lymphoma and percutaneous sampling should
be considered for further evaluation. Lytic lesion within the iliac
spine is largely comprised of soft tissue and multiple hepatic
lesions are relatively superficial and these lesions should be
amenable to CT-guided or ultrasound-guided biopsy, respectively, for
further evaluation.

Aortic Atherosclerosis ([id number]).

## 2021-08-26 ENCOUNTER — Other Ambulatory Visit: Payer: Self-pay

## 2021-08-26 ENCOUNTER — Encounter: Payer: Self-pay | Admitting: Hematology and Oncology

## 2021-08-26 ENCOUNTER — Inpatient Hospital Stay: Payer: Medicare HMO | Attending: Hematology and Oncology

## 2021-08-26 ENCOUNTER — Ambulatory Visit: Payer: Medicare HMO

## 2021-08-26 ENCOUNTER — Inpatient Hospital Stay: Payer: Medicare HMO

## 2021-08-26 ENCOUNTER — Inpatient Hospital Stay (HOSPITAL_BASED_OUTPATIENT_CLINIC_OR_DEPARTMENT_OTHER): Payer: Medicare HMO | Admitting: Physician Assistant

## 2021-08-26 ENCOUNTER — Inpatient Hospital Stay (HOSPITAL_BASED_OUTPATIENT_CLINIC_OR_DEPARTMENT_OTHER): Payer: Medicare HMO | Admitting: Hematology and Oncology

## 2021-08-26 VITALS — BP 117/70 | HR 97 | Temp 98.1°F | Resp 18

## 2021-08-26 VITALS — BP 140/73 | HR 84 | Temp 98.0°F | Resp 18 | Ht 59.0 in | Wt 112.9 lb

## 2021-08-26 DIAGNOSIS — C8331 Diffuse large B-cell lymphoma, lymph nodes of head, face, and neck: Secondary | ICD-10-CM | POA: Insufficient documentation

## 2021-08-26 DIAGNOSIS — I10 Essential (primary) hypertension: Secondary | ICD-10-CM | POA: Diagnosis not present

## 2021-08-26 DIAGNOSIS — R32 Unspecified urinary incontinence: Secondary | ICD-10-CM | POA: Insufficient documentation

## 2021-08-26 DIAGNOSIS — Z5189 Encounter for other specified aftercare: Secondary | ICD-10-CM | POA: Insufficient documentation

## 2021-08-26 DIAGNOSIS — R300 Dysuria: Secondary | ICD-10-CM

## 2021-08-26 DIAGNOSIS — C8330 Diffuse large B-cell lymphoma, unspecified site: Secondary | ICD-10-CM

## 2021-08-26 DIAGNOSIS — N39490 Overflow incontinence: Secondary | ICD-10-CM | POA: Diagnosis not present

## 2021-08-26 DIAGNOSIS — D649 Anemia, unspecified: Secondary | ICD-10-CM | POA: Insufficient documentation

## 2021-08-26 DIAGNOSIS — L89302 Pressure ulcer of unspecified buttock, stage 2: Secondary | ICD-10-CM

## 2021-08-26 DIAGNOSIS — Z5112 Encounter for antineoplastic immunotherapy: Secondary | ICD-10-CM | POA: Diagnosis not present

## 2021-08-26 DIAGNOSIS — M7989 Other specified soft tissue disorders: Secondary | ICD-10-CM | POA: Diagnosis not present

## 2021-08-26 LAB — URINALYSIS, COMPLETE (UACMP) WITH MICROSCOPIC
Bilirubin Urine: NEGATIVE
Glucose, UA: NEGATIVE mg/dL
Ketones, ur: NEGATIVE mg/dL
Nitrite: POSITIVE — AB
Protein, ur: NEGATIVE mg/dL
Specific Gravity, Urine: 1.009 (ref 1.005–1.030)
pH: 7 (ref 5.0–8.0)

## 2021-08-26 LAB — CBC WITH DIFFERENTIAL (CANCER CENTER ONLY)
Abs Immature Granulocytes: 0.04 10*3/uL (ref 0.00–0.07)
Basophils Absolute: 0 10*3/uL (ref 0.0–0.1)
Basophils Relative: 0 %
Eosinophils Absolute: 0 10*3/uL (ref 0.0–0.5)
Eosinophils Relative: 0 %
HCT: 30.9 % — ABNORMAL LOW (ref 36.0–46.0)
Hemoglobin: 10 g/dL — ABNORMAL LOW (ref 12.0–15.0)
Immature Granulocytes: 0 %
Lymphocytes Relative: 18 %
Lymphs Abs: 1.6 10*3/uL (ref 0.7–4.0)
MCH: 23.4 pg — ABNORMAL LOW (ref 26.0–34.0)
MCHC: 32.4 g/dL (ref 30.0–36.0)
MCV: 72.2 fL — ABNORMAL LOW (ref 80.0–100.0)
Monocytes Absolute: 0.9 10*3/uL (ref 0.1–1.0)
Monocytes Relative: 10 %
Neutro Abs: 6.6 10*3/uL (ref 1.7–7.7)
Neutrophils Relative %: 72 %
Platelet Count: 299 10*3/uL (ref 150–400)
RBC: 4.28 MIL/uL (ref 3.87–5.11)
RDW: 17.2 % — ABNORMAL HIGH (ref 11.5–15.5)
WBC Count: 9.1 10*3/uL (ref 4.0–10.5)
nRBC: 0 % (ref 0.0–0.2)

## 2021-08-26 LAB — CMP (CANCER CENTER ONLY)
ALT: 17 U/L (ref 0–44)
AST: 17 U/L (ref 15–41)
Albumin: 3.6 g/dL (ref 3.5–5.0)
Alkaline Phosphatase: 168 U/L — ABNORMAL HIGH (ref 38–126)
Anion gap: 10 (ref 5–15)
BUN: 19 mg/dL (ref 8–23)
CO2: 27 mmol/L (ref 22–32)
Calcium: 9.2 mg/dL (ref 8.9–10.3)
Chloride: 107 mmol/L (ref 98–111)
Creatinine: 0.6 mg/dL (ref 0.44–1.00)
GFR, Estimated: >60 mL/min (ref >60)
Glucose, Bld: 84 mg/dL (ref 70–99)
Potassium: 3.5 mmol/L (ref 3.5–5.1)
Sodium: 144 mmol/L (ref 135–145)
Total Bilirubin: 0.3 mg/dL (ref 0.3–1.2)
Total Protein: 6.7 g/dL (ref 6.5–8.1)

## 2021-08-26 LAB — T4, FREE: Free T4: 0.81 ng/dL (ref 0.61–1.12)

## 2021-08-26 MED ORDER — SODIUM CHLORIDE 0.9 % IV SOLN
375.0000 mg/m2 | Freq: Once | INTRAVENOUS | Status: AC
  Administered 2021-08-26: 500 mg via INTRAVENOUS
  Filled 2021-08-26: qty 50

## 2021-08-26 MED ORDER — SODIUM CHLORIDE 0.9% FLUSH
10.0000 mL | INTRAVENOUS | Status: DC | PRN
  Administered 2021-08-26: 10 mL

## 2021-08-26 MED ORDER — DIPHENHYDRAMINE HCL 25 MG PO CAPS
50.0000 mg | ORAL_CAPSULE | Freq: Once | ORAL | Status: AC
  Administered 2021-08-26: 50 mg via ORAL
  Filled 2021-08-26: qty 2

## 2021-08-26 MED ORDER — SODIUM CHLORIDE 0.9 % IV SOLN: Freq: Once | INTRAVENOUS | Status: AC

## 2021-08-26 MED ORDER — SODIUM CHLORIDE 0.9 % IV SOLN
150.0000 mg | Freq: Once | INTRAVENOUS | Status: AC
  Administered 2021-08-26: 150 mg via INTRAVENOUS
  Filled 2021-08-26: qty 150

## 2021-08-26 MED ORDER — SODIUM CHLORIDE 0.9 % IV SOLN
400.0000 mg/m2 | Freq: Once | INTRAVENOUS | Status: AC
  Administered 2021-08-26: 560 mg via INTRAVENOUS
  Filled 2021-08-26: qty 28

## 2021-08-26 MED ORDER — VINCRISTINE SULFATE CHEMO INJECTION 1 MG/ML
1.0000 mg | Freq: Once | INTRAVENOUS | Status: AC
  Administered 2021-08-26: 1 mg via INTRAVENOUS
  Filled 2021-08-26: qty 1

## 2021-08-26 MED ORDER — SODIUM CHLORIDE 0.9 % IV SOLN
10.0000 mg | Freq: Once | INTRAVENOUS | Status: AC
  Administered 2021-08-26: 10 mg via INTRAVENOUS
  Filled 2021-08-26: qty 10

## 2021-08-26 MED ORDER — DOXORUBICIN HCL CHEMO IV INJECTION 2 MG/ML
25.0000 mg/m2 | Freq: Once | INTRAVENOUS | Status: AC
  Administered 2021-08-26: 36 mg via INTRAVENOUS
  Filled 2021-08-26: qty 18

## 2021-08-26 MED ORDER — PALONOSETRON HCL INJECTION 0.25 MG/5ML
0.2500 mg | Freq: Once | INTRAVENOUS | Status: AC
  Administered 2021-08-26: 0.25 mg via INTRAVENOUS
  Filled 2021-08-26: qty 5

## 2021-08-26 MED ORDER — ACETAMINOPHEN 325 MG PO TABS
650.0000 mg | ORAL_TABLET | Freq: Once | ORAL | Status: AC
  Administered 2021-08-26: 650 mg via ORAL
  Filled 2021-08-26: qty 2

## 2021-08-26 MED ORDER — HEPARIN SOD (PORK) LOCK FLUSH 100 UNIT/ML IV SOLN
500.0000 [IU] | Freq: Once | INTRAVENOUS | Status: AC | PRN
  Administered 2021-08-26: 500 [IU]

## 2021-08-26 NOTE — Progress Notes (Signed)
[facility] Symptom Management Appointment    HISTORY: [name] 87 y.o. female was seen in the infusion room today while receiving treatment for her diffuse large cell lymphoma. The patient initially noticed a blister just below the gluteal cleft that ruptured. She now has progressively worsening skin breakdown in this area which now spans a couple cms. No purulent discharge, no fevers, no swelling, warmth, or surrounding skin erythema. The patient is elderly and is likely sedentary. She lives with her niece at home. She is incontinent of urine (see MD note from earlier today) and has an odor of urine on her today. No muscle or bone is exposure.   MEDICAL HISTORY: Past Medical History:  Diagnosis Date   Anemia    Arthritis    knees    GERD (gastroesophageal reflux disease)    Hiatal hernia    History of inguinal hernia repair    BIH   Hyperlipidemia    Hypertension    Memory problem     ALLERGIES:  has No Known Allergies.  MEDICATIONS:  Current Outpatient Medications  Medication Sig Dispense Refill   acetaminophen (TYLENOL) 500 MG tablet Take 1,000 mg by mouth every 6 (six) hours as needed for mild pain or fever.     allopurinol (ZYLOPRIM) 300 MG tablet Take 1 tablet (300 mg total) by mouth daily. 30 tablet 0   amLODipine (NORVASC) 10 MG tablet Take 10 mg by mouth daily.     apixaban (ELIQUIS) 5 MG TABS tablet Take 1 tablet (5 mg total) by mouth 2 (two) times daily. 60 tablet 2   atorvastatin (LIPITOR) 80 MG tablet Take 80 mg by mouth every morning.     ezetimibe (ZETIA) 10 MG tablet Take 10 mg by mouth every morning.     HYDROcodone-acetaminophen (NORCO/VICODIN) 5-325 MG tablet Take 1 tablet by mouth every 4 (four) hours as needed. 15 tablet 0   lidocaine-prilocaine (EMLA) cream Apply to affected area once 30 g 3   LORazepam (ATIVAN) 0.5 MG tablet Take 1 tablet (0.5 mg total) by mouth every 6 (six) hours as needed (Nausea or vomiting). 30 tablet 0   memantine  (NAMENDA) 10 MG tablet Take 1/2 tablet (5 mg) for 1 week at bedtime, then 1 tablet (10 mg) at bedtime. 27 tablet 0   memantine (NAMENDA) 10 MG tablet Take 1 tablet (10 mg total) by mouth at bedtime. 90 tablet 1   ondansetron (ZOFRAN) 8 MG tablet Take 1 tablet (8 mg total) by mouth 2 (two) times daily as needed for refractory nausea / vomiting. Start on day 3 after cyclophosphamide chemotherapy. 30 tablet 1   predniSONE (DELTASONE) 20 MG tablet Take 3 tablets (60 mg total) by mouth daily. Take with food on days 1-5 of chemotherapy. 15 tablet 5   prochlorperazine (COMPAZINE) 10 MG tablet Take 1 tablet (10 mg total) by mouth every 6 (six) hours as needed (Nausea or vomiting). 30 tablet 6   No current facility-administered medications for this visit.    SURGICAL HISTORY:  Past Surgical History:  Procedure Laterality Date   COLONOSCOPY     HERNIA REPAIR  10/13/11   lap BIH rep w/ mesh- Dr. Tora Kindred HERNIA REPAIR  10/13/2011   Procedure: LAPAROSCOPIC INGUINAL HERNIA;  Surgeon: Adin Hector, MD;  Location: [facility];  Service: General;  Laterality: Bilateral;  Laparoscopic bilateral inguinal hernia repair, converted to open bilateral inguinal hernia repairs with mesh   IR IMAGING GUIDED PORT INSERTION  05/06/2021   NASAL ENDOSCOPY Left 04/08/2021   Procedure: NASAL ENDOSCOPY WITH BIOPSY OF LEFT NASOPPHARYNGEAL MASS;  Surgeon: Jason Coop, DO;  Location: [facility] OR;  Service: ENT;  Laterality: Left;  Frozen Section    REVIEW OF SYSTEMS:   Review of Systems  Constitutional: Negative for appetite change, chills, fatigue, fever and unexpected weight change.  Respiratory: Negative for cough, hemoptysis, shortness of breath and wheezing.   Cardiovascular: Negative for chest pain. Positive for leg swelling.  Genitourinary: Positive for urinary incontinence.  Skin: Positive for pressure ulcer in sacral area/just below gluteal cleft.  Psychiatric/Behavioral: Negative for confusion,  depression and sleep disturbance. The patient is not nervous/anxious.     PHYSICAL EXAMINATION:  There were no vitals taken for this visit.  ECOG PERFORMANCE STATUS: 1  Physical Exam  Constitutional: Oriented to person, place, and time and elderly appearing female and in no distress. Smelled or urine.  HENT:  Head: Normocephalic and atraumatic.  Eyes: Conjunctivae are normal. Right eye exhibits no discharge. Left eye exhibits no discharge. No scleral icterus.  Neck: Normal range of motion. Neck supple.  Neurological: Alert and oriented to person, place, and time. Exhibits normal muscle tone. Gait normal. Coordination normal.  Skin: Stage II pressure ulcer in gluteal cleft. No surrounding erythema, purulent discharge, warmth, or swelling.  Psychiatric: Mood, memory and judgment normal.  Vitals reviewed.  LABORATORY DATA: Lab Results  Component Value Date   WBC 9.1 08/26/2021   HGB 10.0 (L) 08/26/2021   HCT 30.9 (L) 08/26/2021   MCV 72.2 (L) 08/26/2021   PLT 299 08/26/2021      Chemistry      Component Value Date/Time   NA 144 08/26/2021 0921   K 3.5 08/26/2021 0921   CL 107 08/26/2021 0921   CO2 27 08/26/2021 0921   BUN 19 08/26/2021 0921   CREATININE 0.60 08/26/2021 0921      Component Value Date/Time   CALCIUM 9.2 08/26/2021 0921   ALKPHOS 168 (H) 08/26/2021 0921   AST 17 08/26/2021 0921   ALT 17 08/26/2021 0921   BILITOT 0.3 08/26/2021 0921       RADIOGRAPHIC STUDIES:  NM PET Image Initial (PI) Skull Base To Thigh  Result Date: 07/31/2021 CLINICAL DATA:  Subsequent treatment strategy for diffuse B-cell lymphoma. EXAM: NUCLEAR MEDICINE PET SKULL BASE TO THIGH TECHNIQUE: 5.4 mCi F-18 FDG was injected intravenously. Full-ring PET imaging was performed from the skull base to thigh after the radiotracer. CT data was obtained and used for attenuation correction and anatomic localization. Fasting blood glucose: 95 mg/dl COMPARISON:  Multiple exams, including 04/28/2021  FINDINGS: Mediastinal blood pool activity: SUV max 1.4 Liver activity: SUV max 1.9 NECK: Substantial reduction in size and activity of the left posterior nasopharyngeal mass, previously 4.2 by 2.4 cm with maximum SUV of 10.2 and currently 2.0 by 1.0 cm on image 15 of series 4 with maximum SUV 4.9 (Deauville 5). Incidental CT findings: Improved chronic left maxillary sinusitis. Bilateral common carotid atherosclerotic calcification. Stable enlargement and diffuse heterogeneity of the thyroid gland without hypermetabolic activity. CHEST: Index left axillary node measures 0.5 cm in short axis on image 53 of series 4 with maximum SUV 1.2, previously measuring 0.7 cm in short axis with maximum SUV of 2.3. Incidental CT findings: Coronary, aortic arch, and branch vessel atherosclerotic vascular disease. Stable mild bibasilar scarring. Subtle reticulonodular opacity in the lingula on image 34 series 8 appears stable and does not have appreciable hypermetabolic activity. ABDOMEN/PELVIS: Previous hypermetabolic liver  lesions are currently hypodense and mildly photopenic. For example, the larger the 2 previous dominant hypermetabolic liver lesions has corresponding hypodensity measuring 2.5 by 2.8 cm on image 85 series 4 and metabolic activity of 1.7 (Deauville 3), previously 6.3. Currently no hypermetabolic liver lesion is observed. Activity just proximal to the right iliac crossover is ascribed to ureteral excretion of radiopharmaceutical. Incidental CT findings: Atherosclerosis is present, including aortoiliac atherosclerotic disease. Suspected small uterine fibroids. Mild diffuse subcutaneous edema. SKELETON: The numerous previous scattered hypermetabolic lesions throughout the axial and appendicular skeleton no longer demonstrate focal hypermetabolic activity, with corresponding lesions reduced in density compared to previous. The index right iliac bone lesion has a precontrast internal density of 21 Hounsfield units  (formerly 38 Hounsfield units) and including intraosseous and extraosseous component measures about 2.9 by 2.0 cm (formerly 2.8 by 2.3 cm), with representative internal SUV of 1.8 (Deauville 3), formerly 7.7. Likewise the other scattered bony lesions are similarly substantially reduced in activity compared to previous. There is some diffuse accentuated activity throughout the skeleton possibly related to granulocyte stimulation. Against this background many of the previous scattered bony lesions are no longer readily apparent. Incidental CT findings: Lucencies related to the prior bony lesions are observed. Prominent bilateral degenerative glenohumeral arthropathy. Healing left anterior third rib fracture, new compared to 04/28/2021. IMPRESSION: 1. Substantial improvement, with notably reduced size and activity of the left posterior nasopharyngeal mass as well as reduced density and substantially reduced activity of the scattered osseous lesions and of the liver lesions. The posterior nasopharyngeal mass is still Deauville 5, although substantially reduced in activity; the index hepatic and bony lesions are currently Deauville 3. 2. Background diffuse accentuated bony activity, query granulocyte stimulation. 3. Stable enlarged somewhat heterogeneous thyroid gland. In the setting of significant comorbidities or limited life expectancy, no follow-up recommended (ref: J Am Coll Radiol. 2015 Feb;12(2): 143-50). 4. Other imaging findings of potential clinical significance: Improved chronic left maxillary sinusitis. Healing left anterior third rib fracture, new compared to 04/28/2021. Aortic Atherosclerosis (ICD10-I70.0). Coronary atherosclerosis. Mild diffuse subcutaneous edema. Electronically Signed   By: Van Clines M.D.   On: 07/31/2021 07:51     ASSESSMENT/PLAN:  This is a very pleasant 87 year old African American female with Diffuse Large B Cell Lymphoma.   The patient has a stage II pressure ulcer. It  does not appear to be infected. No leukocytosis on labs. No surrounding erythema, swelling, purulent discharge, or warmth. She is incontinent of urine and had an episode of urinary incontinence while in the infusion room today. She is likely sedentary. Given that she is elderly, incontinent, and sedentary, she is at high risk for progressively worsening pressure ulcer and I have low confidence that this improve without wound care assistance/management. I will refer her to a wound clinic. I will see if she qualifies to receive home health wound care given that she is transportation dependant. She lives with her niece.   I received that she should reposition every 2 hours, avoid friction to this area, keep a bandage over the wound, and keep the area clean and dry. Also advised to sit on a cushion. She was given education on her AVS for pressure ulcers. Discussed if she develops fevers, increased pain, swelling, purulent drainage, or warmth, she needs to be evaluated sooner.   Discussed sitting in urine will exacerbate her wound. Advised to be diligent about staying dry.   The patient was advised to call immediately if she has any concerning symptoms in  the interval. The patient voices understanding of current disease status and treatment options and is in agreement with the current care plan. All questions were answered. The patient knows to call the clinic with any problems, questions or concerns. We can certainly see the patient much sooner if necessary   No orders of the defined types were placed in this encounter.     The total time spent in the appointment was 20-29 minutes.   Kerman Pfost L Bridget , PA-C 08/26/21

## 2021-08-26 NOTE — Progress Notes (Signed)
Per Dr. Chryl Heck - okay to proceed with echo from June 2022.

## 2021-08-26 NOTE — Patient Instructions (Signed)
[facility]   Discharge Instructions: Thank you for choosing [facility] to provide your oncology and hematology care.   If you have a lab appointment with the [facility], please go directly to the [facility] and check in at the registration area.   Wear comfortable clothing and clothing appropriate for easy access to any Portacath or PICC line.   We strive to give you quality time with your provider. You may need to reschedule your appointment if you arrive late (15 or more minutes).  Arriving late affects you and other patients whose appointments are after yours.  Also, if you miss three or more appointments without notifying the office, you may be dismissed from the clinic at the provider's discretion.      For prescription refill requests, have your pharmacy contact our office and allow 72 hours for refills to be completed.    Today you received the following chemotherapy and/or immunotherapy agents: Doxorubicine (Adriamycin), Vincristine (Oncovin), Cyclophosphamide (Cytoxan), and Rituximab (Rituxan)      To help prevent nausea and vomiting after your treatment, we encourage you to take your nausea medication as directed.  BELOW ARE SYMPTOMS THAT SHOULD BE REPORTED IMMEDIATELY: *FEVER GREATER THAN 100.4 F (38 C) OR HIGHER *CHILLS OR SWEATING *NAUSEA AND VOMITING THAT IS NOT CONTROLLED WITH YOUR NAUSEA MEDICATION *UNUSUAL SHORTNESS OF BREATH *UNUSUAL BRUISING OR BLEEDING *URINARY PROBLEMS (pain or burning when urinating, or frequent urination) *BOWEL PROBLEMS (unusual diarrhea, constipation, pain near the anus) TENDERNESS IN MOUTH AND THROAT WITH OR WITHOUT PRESENCE OF ULCERS (sore throat, sores in mouth, or a toothache) UNUSUAL RASH, SWELLING OR PAIN  UNUSUAL VAGINAL DISCHARGE OR ITCHING   Items with * indicate a potential emergency and should be followed up as soon as possible or go to the Emergency Department if any problems should  occur.  Please show the CHEMOTHERAPY ALERT CARD or IMMUNOTHERAPY ALERT CARD at check-in to the Emergency Department and triage nurse.  Should you have questions after your visit or need to cancel or reschedule your appointment, please contact [facility]  Dept: [phone]  and follow the prompts.  Office hours are 8:00 a.m. to 4:30 p.m. Monday - Friday. Please note that voicemails left after 4:00 p.m. may not be returned until the following business day.  We are closed weekends and major holidays. You have access to a nurse at all times for urgent questions. Please call the main number to the clinic Dept: [phone] and follow the prompts.   For any non-urgent questions, you may also contact your provider using MyChart. We now offer e-Visits for anyone 85 and older to request care online for non-urgent symptoms. For details visit [email or web address].   Also download the MyChart app! Go to the app store, search "MyChart", open the app, select [facility], and log in with your MyChart username and password.  Due to Covid, a mask is required upon entering the hospital/clinic. If you do not have a mask, one will be given to you upon arrival. For doctor visits, patients may have 1 support person aged 85 or older with them. For treatment visits, patients cannot have anyone with them due to current Covid guidelines and our immunocompromised population.

## 2021-08-26 NOTE — Assessment & Plan Note (Signed)
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  She has been doing quite well since initiation of chemotherapy.  No major toxicities to report. Physical examination quite unremarkable except for some bilateral lower extremity swelling and some ongoing urinary incontinence.  I do remember that she smelling of urine during her past visits as well.  Hence I believe this is a chronic complaint and unrelated to the lymphoma.   I have reviewed his CBC and CMP, labs okay to proceed with treatment as planned. She had an interim PET/CT after 3 cycles which showed remarkable response.  Plan is to continue 6 cycles of R mini CHOP and consider repeat PET scan at the end of the treatment as well as intrathecal methotrexate prophylaxis at the end of the treatment although I believe she is of borderline PS to consider any CNS prophylaxis.  We will have to reassess this after her last cycle of chemotherapy.

## 2021-08-26 NOTE — Progress Notes (Signed)
[name]  Patient Care Team: [name] Contras, MD as PCP - General (Family Medicine)  CHIEF COMPLAINTS/PURPOSE OF CONSULTATION:  DLBCL of nasopharynx.  ASSESSMENT & PLAN:   GCB subtype, DLBCL of nasopharynx, Stage IV  Diffuse large B-cell lymphoma (HCC) This is a very pleasant 87 year old female patient with diffuse large B-cell lymphoma of the nasopharynx with a metastatic disease noticed on imaging who is here for a follow-up after cycle 4 of R mini CHOP.  She has been doing quite well since initiation of chemotherapy.  No major toxicities to report. Physical examination quite unremarkable except for some bilateral lower extremity swelling and some ongoing urinary incontinence.  I do remember that she smelling of urine during her past visits as well.  Hence I believe this is a chronic complaint and unrelated to the lymphoma.   I have reviewed his CBC and CMP, labs okay to proceed with treatment as planned. She had an interim PET/CT after 3 cycles which showed remarkable response.  Plan is to continue 6 cycles of R mini CHOP and consider repeat PET scan at the end of the treatment as well as intrathecal methotrexate prophylaxis at the end of the treatment although I believe she is of borderline PS to consider any CNS prophylaxis.  We will have to reassess this after her last cycle of chemotherapy.  Urinary incontinence I do not believe this is a symptom related to her lymphoma or her chemotherapy.  I do remember she having some urinary leakage issues and some urine odor during her prior appointments.  We will try to examine the site of vulvar irritation today however she was actively dripping urine hence have ordered a urine sample.  Since this is a chronic complaint and no evidence of systemic signs of infection, I do not believe this is an active urinary tract infection.  We will review the UA and proceed accordingly Discussed the importance of keeping personal  hygiene as a priority since she is a very high risk for infection.  Normocytic normochromic anemia This has continued to improve since we initiated chemotherapy.  There is no indication for transfusion.  We will continue to monitor this.  Orders Placed This Encounter  Procedures   Urinalysis, Complete w Microscopic    Standing Status:   Future    Standing Expiration Date:   08/26/2022   Although she might benefit from CNS prophylaxis, she is a very high risk for methotrexate infusions given her age and performance status.  we will monitor closely and if she continues to do well at the end of the treatment can consider intrathecal methotrexate.  I have discussed this today with her son and the patient.  We will consider this approach once she finishes planned 6 cycles of R mini CHOP.  HISTORY OF PRESENTING ILLNESS:   [name] 87 y.o. female is here because of new diagnosis of DLBCL of nasopharynx.  [name] is a very pleasant 87 year old female patient who arrived today with her granddaughter Ms. [name] to the appointment.  She does not quite remember things as explained and has short-term memory loss.  After a bit of the conversation, she did admit to me that she was told about a cancer in her nose which was biopsied last week but she cannot remember the details.  Ms. [name] mentioned that for the past year or so grandma has been having more nasal drainage and has progressively lost weight in the past couple months of  at least 20 pounds, has become relatively more sedentary and inactive, has no appetite.  They initially thought she might be having a stroke when she was not getting out of bed for 3days and has not been eating.  When she was in the ER.  She had some imaging CT head without contrast which showed left maxillary and ethmoid sinus disease. She then had MRI brain on the same day which showed 3.2 x 2 x 3.5 cm heterogeneous mass positioned at the left nasopharynx near the fossa of  [name].  Finding is most concerning for a primary nasopharyngeal neoplasm.  ENT consultation was recommended.  She then went on to see Dr. Isaias [name].She had nasal endoscopy with biopsy of left nasopharyngeal mass.  According to Dr. Isaias [name], polypoid mass lesion was noted to be lymphoma from the posterior aspect of the left middle turbinate.  Pathology showed findings consistent with diffuse large B-cell lymphoma of the left nasopharyngeal mass as well as left middle turbinate.   PET/CT showed hypermetabolic left nasopharyngeal mass, no associated neck adenopathy, 2 large hypermetabolic liver lesions, destructive lytic metastatic bone lesions.  Since there was no lymphadenopathy and no unusual appearance of spleen, second biopsy was recommended to confirm the diagnosis.  Left ilium  Biopsy showed findings consistent with involvement by non-Hodgkin B-cell lymphoma.  Features are similar to previously known large B-cell lymphoma consistent with involvement by the same disease process.  Cycle day 1 of R mini CHOP on 06/03/2021. C2D1 06/23/2021 Cycle 3-day 1 completed on July 15, 2021 She had an interim PET/CT which showed remarkable response. C4D1 on 08/05/2021 C5D1 anticipated today  Interval History  She is here for follow-up with her niece She has been feeling well.  No major side effects from last cycle of chemotherapy.  No fevers, chills, change in breathing, neuropathy, change in bowel habits. Niece mentions that her son has discussed with her about some irritation of the urinary site with some urinary incontinence.  She denies any abdominal or flank discomfort or fevers.  This urinary leakage has been going on for couple months or so.   No new neurological complaints. Rest of the pertinent 10 point ROS reviewed and negative.  MEDICAL HISTORY:  Past Medical History:  Diagnosis Date   Anemia    Arthritis    knees    GERD (gastroesophageal reflux disease)    Hiatal hernia     History of inguinal hernia repair    BIH   Hyperlipidemia    Hypertension    Memory problem     SURGICAL HISTORY: Past Surgical History:  Procedure Laterality Date   COLONOSCOPY     HERNIA REPAIR  10/13/11   lap BIH rep w/ mesh- Dr. Tora Kindred HERNIA REPAIR  10/13/2011   Procedure: LAPAROSCOPIC INGUINAL HERNIA;  Surgeon: Adin Hector, MD;  Location: [facility];  Service: General;  Laterality: Bilateral;  Laparoscopic bilateral inguinal hernia repair, converted to open bilateral inguinal hernia repairs with mesh   IR IMAGING GUIDED PORT INSERTION  05/06/2021   NASAL ENDOSCOPY Left 04/08/2021   Procedure: NASAL ENDOSCOPY WITH BIOPSY OF LEFT NASOPPHARYNGEAL MASS;  Surgeon: Jason Coop, DO;  Location: [facility];  Service: ENT;  Laterality: Left;  Frozen Section    SOCIAL HISTORY: Social History   Socioeconomic History   Marital status: Single    Spouse name: Not on file   Number of children: Not on file   Years of education: Not on file   Highest  education level: Not on file  Occupational History   Not on file  Tobacco Use   Smoking status: Never   Smokeless tobacco: Never  Vaping Use   Vaping Use: Never used  Substance and Sexual Activity   Alcohol use: No   Drug use: No   Sexual activity: Not on file  Other Topics Concern   Not on file  Social History Narrative   Not on file   Social Determinants of Health   Financial Resource Strain: Not on file  Food Insecurity: Not on file  Transportation Needs: Not on file  Physical Activity: Not on file  Stress: Not on file  Social Connections: Not on file  Intimate Partner Violence: Not on file    FAMILY HISTORY: Family History  Problem Relation Age of Onset   Heart disease Mother     ALLERGIES:  has No Known Allergies.  MEDICATIONS:  Current Outpatient Medications  Medication Sig Dispense Refill   acetaminophen (TYLENOL) 500 MG tablet Take 1,000 mg by mouth every 6 (six) hours as needed for mild pain  or fever.     allopurinol (ZYLOPRIM) 300 MG tablet Take 1 tablet (300 mg total) by mouth daily. 30 tablet 0   amLODipine (NORVASC) 10 MG tablet Take 10 mg by mouth daily.     apixaban (ELIQUIS) 5 MG TABS tablet Take 1 tablet (5 mg total) by mouth 2 (two) times daily. 60 tablet 2   atorvastatin (LIPITOR) 80 MG tablet Take 80 mg by mouth every morning.     ezetimibe (ZETIA) 10 MG tablet Take 10 mg by mouth every morning.     HYDROcodone-acetaminophen (NORCO/VICODIN) 5-325 MG tablet Take 1 tablet by mouth every 4 (four) hours as needed. 15 tablet 0   lidocaine-prilocaine (EMLA) cream Apply to affected area once 30 g 3   LORazepam (ATIVAN) 0.5 MG tablet Take 1 tablet (0.5 mg total) by mouth every 6 (six) hours as needed (Nausea or vomiting). 30 tablet 0   memantine (NAMENDA) 10 MG tablet Take 1/2 tablet (5 mg) for 1 week at bedtime, then 1 tablet (10 mg) at bedtime. 27 tablet 0   memantine (NAMENDA) 10 MG tablet Take 1 tablet (10 mg total) by mouth at bedtime. 90 tablet 1   ondansetron (ZOFRAN) 8 MG tablet Take 1 tablet (8 mg total) by mouth 2 (two) times daily as needed for refractory nausea / vomiting. Start on day 3 after cyclophosphamide chemotherapy. 30 tablet 1   predniSONE (DELTASONE) 20 MG tablet Take 3 tablets (60 mg total) by mouth daily. Take with food on days 1-5 of chemotherapy. 15 tablet 5   prochlorperazine (COMPAZINE) 10 MG tablet Take 1 tablet (10 mg total) by mouth every 6 (six) hours as needed (Nausea or vomiting). 30 tablet 6   No current facility-administered medications for this visit.    PHYSICAL EXAMINATION:  ECOG PERFORMANCE STATUS: 1/2  Physical Exam Constitutional:      Appearance: Normal appearance.  HENT:     Head: Normocephalic and atraumatic.  Eyes:     Pupils: Pupils are equal, round, and reactive to light.  Cardiovascular:     Rate and Rhythm: Normal rate and regular rhythm.     Pulses: Normal pulses.     Heart sounds: Normal heart sounds.  Pulmonary:      Effort: Pulmonary effort is normal.     Breath sounds: Normal breath sounds.  Abdominal:     General: Abdomen is flat. Bowel sounds are normal.  Palpations: Abdomen is soft.  Genitourinary:    Comments: I try to examine the site of irritation on her vulva but she started dripping urine everywhere so I cannot really see any major erythema. Musculoskeletal:        General: Swelling (Bilateral lower extremity swelling stable since last visit) present. No tenderness.     Cervical back: Normal range of motion and neck supple. No rigidity.  Lymphadenopathy:     Cervical: No cervical adenopathy.  Skin:    General: Skin is warm and dry.  Neurological:     General: No focal deficit present.     Mental Status: She is alert.  Psychiatric:        Mood and Affect: Mood normal.     LABORATORY DATA:  I have reviewed the data as listed Lab Results  Component Value Date   WBC 9.1 08/26/2021   HGB 10.0 (L) 08/26/2021   HCT 30.9 (L) 08/26/2021   MCV 72.2 (L) 08/26/2021   PLT 299 08/26/2021     Chemistry      Component Value Date/Time   NA 144 08/26/2021 0921   K 3.5 08/26/2021 0921   CL 107 08/26/2021 0921   CO2 27 08/26/2021 0921   BUN 19 08/26/2021 0921   CREATININE 0.60 08/26/2021 0921      Component Value Date/Time   CALCIUM 9.2 08/26/2021 0921   ALKPHOS 168 (H) 08/26/2021 0921   AST 17 08/26/2021 0921   ALT 17 08/26/2021 0921   BILITOT 0.3 08/26/2021 0921      RADIOGRAPHIC STUDIES: I have personally reviewed the radiological images as listed and agreed with the findings in the report. NM PET Image Initial (PI) Skull Base To Thigh  Result Date: 07/31/2021 CLINICAL DATA:  Subsequent treatment strategy for diffuse B-cell lymphoma. EXAM: NUCLEAR MEDICINE PET SKULL BASE TO THIGH TECHNIQUE: 5.4 mCi F-18 FDG was injected intravenously. Full-ring PET imaging was performed from the skull base to thigh after the radiotracer. CT data was obtained and used for attenuation correction  and anatomic localization. Fasting blood glucose: 95 mg/dl COMPARISON:  Multiple exams, including 04/28/2021 FINDINGS: Mediastinal blood pool activity: SUV max 1.4 Liver activity: SUV max 1.9 NECK: Substantial reduction in size and activity of the left posterior nasopharyngeal mass, previously 4.2 by 2.4 cm with maximum SUV of 10.2 and currently 2.0 by 1.0 cm on image 15 of series 4 with maximum SUV 4.9 (Deauville 5). Incidental CT findings: Improved chronic left maxillary sinusitis. Bilateral common carotid atherosclerotic calcification. Stable enlargement and diffuse heterogeneity of the thyroid gland without hypermetabolic activity. CHEST: Index left axillary node measures 0.5 cm in short axis on image 53 of series 4 with maximum SUV 1.2, previously measuring 0.7 cm in short axis with maximum SUV of 2.3. Incidental CT findings: Coronary, aortic arch, and branch vessel atherosclerotic vascular disease. Stable mild bibasilar scarring. Subtle reticulonodular opacity in the lingula on image 34 series 8 appears stable and does not have appreciable hypermetabolic activity. ABDOMEN/PELVIS: Previous hypermetabolic liver lesions are currently hypodense and mildly photopenic. For example, the larger the 2 previous dominant hypermetabolic liver lesions has corresponding hypodensity measuring 2.5 by 2.8 cm on image 85 series 4 and metabolic activity of 1.7 (Deauville 3), previously 6.3. Currently no hypermetabolic liver lesion is observed. Activity just proximal to the right iliac crossover is ascribed to ureteral excretion of radiopharmaceutical. Incidental CT findings: Atherosclerosis is present, including aortoiliac atherosclerotic disease. Suspected small uterine fibroids. Mild diffuse subcutaneous edema. SKELETON: The numerous previous scattered  hypermetabolic lesions throughout the axial and appendicular skeleton no longer demonstrate focal hypermetabolic activity, with corresponding lesions reduced in density compared  to previous. The index right iliac bone lesion has a precontrast internal density of 21 Hounsfield units (formerly 38 Hounsfield units) and including intraosseous and extraosseous component measures about 2.9 by 2.0 cm (formerly 2.8 by 2.3 cm), with representative internal SUV of 1.8 (Deauville 3), formerly 7.7. Likewise the other scattered bony lesions are similarly substantially reduced in activity compared to previous. There is some diffuse accentuated activity throughout the skeleton possibly related to granulocyte stimulation. Against this background many of the previous scattered bony lesions are no longer readily apparent. Incidental CT findings: Lucencies related to the prior bony lesions are observed. Prominent bilateral degenerative glenohumeral arthropathy. Healing left anterior third rib fracture, new compared to 04/28/2021. IMPRESSION: 1. Substantial improvement, with notably reduced size and activity of the left posterior nasopharyngeal mass as well as reduced density and substantially reduced activity of the scattered osseous lesions and of the liver lesions. The posterior nasopharyngeal mass is still Deauville 5, although substantially reduced in activity; the index hepatic and bony lesions are currently Deauville 3. 2. Background diffuse accentuated bony activity, query granulocyte stimulation. 3. Stable enlarged somewhat heterogeneous thyroid gland. In the setting of significant comorbidities or limited life expectancy, no follow-up recommended (ref: J Am Coll Radiol. 2015 Feb;12(2): 143-50). 4. Other imaging findings of potential clinical significance: Improved chronic left maxillary sinusitis. Healing left anterior third rib fracture, new compared to 04/28/2021. Aortic Atherosclerosis (ICD10-I70.0). Coronary atherosclerosis. Mild diffuse subcutaneous edema. Electronically Signed   By: Van Clines M.D.   On: 07/31/2021 07:51    I have reviewed the pet imaging results with the patient and  compared this to the imaging from June 2022. All questions were answered. The patient knows to call the clinic with any problems, questions or concerns.  I spent 30 minutes in the care of this patient including reviewing plan of care, labs, discussion about follow-up and treatment plan.   Benay Pike, MD 08/26/2021 10:22 AM

## 2021-08-26 NOTE — Assessment & Plan Note (Signed)
I do not believe this is a symptom related to her lymphoma or her chemotherapy.  I do remember she having some urinary leakage issues and some urine odor during her prior appointments.  We will try to examine the site of vulvar irritation today however she was actively dripping urine hence have ordered a urine sample.  Since this is a chronic complaint and no evidence of systemic signs of infection, I do not believe this is an active urinary tract infection.  We will review the UA and proceed accordingly Discussed the importance of keeping personal hygiene as a priority since she is a very high risk for infection.

## 2021-08-26 NOTE — Progress Notes (Signed)
Patient noted to have urinated herself in her sleep. Patient taken to the restroom, cleaned up, and changed. Upon cleaning the patient, nurse noted what appeared to be a pressure injury on sacral area. Area was open and noted to be pink and yellow in appearance with no exudate.  Cassandra Heilingoetter, PA-C notified and came to infusion suite assess the patient. Patient provided education and family notified of the situation. PA recommended for patient to have a wound consult.

## 2021-08-26 NOTE — Progress Notes (Signed)
I spoke with [facility] with [facility] in [place] location who advised they are able to accept this pt.

## 2021-08-26 NOTE — Assessment & Plan Note (Signed)
This has continued to improve since we initiated chemotherapy.  There is no indication for transfusion.  We will continue to monitor this.

## 2021-08-27 ENCOUNTER — Other Ambulatory Visit: Payer: Self-pay | Admitting: Hematology and Oncology

## 2021-08-27 ENCOUNTER — Other Ambulatory Visit: Payer: Self-pay

## 2021-08-27 ENCOUNTER — Encounter: Payer: Self-pay | Admitting: Hematology and Oncology

## 2021-08-27 ENCOUNTER — Telehealth: Payer: Self-pay

## 2021-08-27 DIAGNOSIS — L89153 Pressure ulcer of sacral region, stage 3: Secondary | ICD-10-CM

## 2021-08-27 DIAGNOSIS — L8990 Pressure ulcer of unspecified site, unspecified stage: Secondary | ICD-10-CM | POA: Insufficient documentation

## 2021-08-27 MED ORDER — CIPROFLOXACIN HCL 500 MG PO TABS: 500.0000 mg | ORAL_TABLET | Freq: Two times a day (BID) | ORAL | 0 refills | Status: DC

## 2021-08-27 NOTE — Telephone Encounter (Signed)
Called and spoke with patient's son Jenny Reichmann. Informed her that, per Dr Chryl Heck, patient has signs of possible UTI. Antibiotics called in to patient's preferred pharmacy. Also informed him that we were sending referral to wound care clinic for patient's sacral ulcer. He expressed understanding and appreciation

## 2021-08-27 NOTE — Progress Notes (Signed)
Referral to wound center placed

## 2021-08-27 NOTE — Progress Notes (Signed)
Our infusion nurse reported a decub sacral ulcer while helping her to the bathroom Patient didn't complain of anything in particular today. Also will give her a week of ciprofloxacin for a ? UTI  [name]

## 2021-08-28 ENCOUNTER — Other Ambulatory Visit: Payer: Self-pay | Admitting: Hematology and Oncology

## 2021-08-28 ENCOUNTER — Other Ambulatory Visit: Payer: Self-pay

## 2021-08-28 ENCOUNTER — Inpatient Hospital Stay: Payer: Medicare HMO

## 2021-08-28 VITALS — BP 137/63 | HR 85 | Temp 98.8°F | Resp 17

## 2021-08-28 DIAGNOSIS — M7989 Other specified soft tissue disorders: Secondary | ICD-10-CM | POA: Diagnosis not present

## 2021-08-28 DIAGNOSIS — R32 Unspecified urinary incontinence: Secondary | ICD-10-CM | POA: Diagnosis not present

## 2021-08-28 DIAGNOSIS — I10 Essential (primary) hypertension: Secondary | ICD-10-CM | POA: Diagnosis not present

## 2021-08-28 DIAGNOSIS — C8330 Diffuse large B-cell lymphoma, unspecified site: Secondary | ICD-10-CM

## 2021-08-28 DIAGNOSIS — Z5112 Encounter for antineoplastic immunotherapy: Secondary | ICD-10-CM | POA: Diagnosis not present

## 2021-08-28 DIAGNOSIS — D649 Anemia, unspecified: Secondary | ICD-10-CM | POA: Diagnosis not present

## 2021-08-28 DIAGNOSIS — Z5189 Encounter for other specified aftercare: Secondary | ICD-10-CM | POA: Diagnosis not present

## 2021-08-28 DIAGNOSIS — C8331 Diffuse large B-cell lymphoma, lymph nodes of head, face, and neck: Secondary | ICD-10-CM | POA: Diagnosis not present

## 2021-08-28 MED ORDER — PEGFILGRASTIM-CBQV 6 MG/0.6ML ~~LOC~~ SOSY
6.0000 mg | PREFILLED_SYRINGE | Freq: Once | SUBCUTANEOUS | Status: AC
  Administered 2021-08-28: 6 mg via SUBCUTANEOUS
  Filled 2021-08-28: qty 0.6

## 2021-08-28 NOTE — Progress Notes (Unsigned)
[facility] CONSULT NOTE  Patient Care Team: Antony Contras, MD as PCP - General (Family Medicine)  CHIEF COMPLAINTS/PURPOSE OF CONSULTATION:  DLBCL of nasopharynx.  ASSESSMENT & PLAN:   GCB subtype, DLBCL of nasopharynx, Stage IV  No problem-specific Assessment & Plan notes found for this encounter.  No orders of the defined types were placed in this encounter.  Although she might benefit from CNS prophylaxis, she is a very high risk for methotrexate infusions given her age and performance status.  we will monitor closely and if she continues to do well at the end of the treatment can consider intrathecal methotrexate.  I have discussed this today with her son and the patient.  We will consider this approach once she finishes planned 6 cycles of R mini CHOP.  HISTORY OF PRESENTING ILLNESS:   [name] 87 y.o. female is here because of new diagnosis of DLBCL of nasopharynx.  Ms. Reining is a very pleasant 87 year old female patient who arrived today with her granddaughter Ms. Amber to the appointment.  She does not quite remember things as explained and has short-term memory loss.  After a bit of the conversation, she did admit to me that she was told about a cancer in her nose which was biopsied last week but she cannot remember the details.  [name] mentioned that for the past year or so grandma has been having more nasal drainage and has progressively lost weight in the past couple months of at least 20 pounds, has become relatively more sedentary and inactive, has no appetite.  They initially thought she might be having a stroke when she was not getting out of bed for 3days and has not been eating.  When she was in the ER.  She had some imaging CT head without contrast which showed left maxillary and ethmoid sinus disease. She then had MRI brain on the same day which showed 3.2 x 2 x 3.5 cm heterogeneous mass positioned at the left nasopharynx near the fossa of  [place].  Finding is most concerning for a primary nasopharyngeal neoplasm.  ENT consultation was recommended.  She then went on to see [name].She had nasal endoscopy with biopsy of left nasopharyngeal mass.  According to [name], polypoid mass lesion was noted to be lymphoma from the posterior aspect of the left middle turbinate.  Pathology showed findings consistent with diffuse large B-cell lymphoma of the left nasopharyngeal mass as well as left middle turbinate.   PET/CT showed hypermetabolic left nasopharyngeal mass, no associated neck adenopathy, 2 large hypermetabolic liver lesions, destructive lytic metastatic bone lesions.  Since there was no lymphadenopathy and no unusual appearance of spleen, second biopsy was recommended to confirm the diagnosis.  Left ilium  Biopsy showed findings consistent with involvement by non-Hodgkin B-cell lymphoma.  Features are similar to previously known large B-cell lymphoma consistent with involvement by the same disease process.  Cycle day 1 of R mini CHOP on 06/03/2021. C2D1 06/23/2021 Cycle 3-day 1 completed on July 15, 2021 She had an interim PET/CT which showed remarkable response. C4D1 on 08/05/2021 C5D1 anticipated today  Interval History  She is here for follow-up with her niece She has been feeling well.  No major side effects from last cycle of chemotherapy.  No fevers, chills, change in breathing, neuropathy, change in bowel habits. Niece mentions that her son has discussed with her about some irritation of the urinary site with some urinary incontinence.  She denies any abdominal or  flank discomfort or fevers.  This urinary leakage has been going on for couple months or so.   No new neurological complaints. Rest of the pertinent 10 point ROS reviewed and negative.  MEDICAL HISTORY:  Past Medical History:  Diagnosis Date   Anemia    Arthritis    knees    GERD (gastroesophageal reflux disease)    Hiatal hernia     History of inguinal hernia repair    BIH   Hyperlipidemia    Hypertension    Memory problem     SURGICAL HISTORY: Past Surgical History:  Procedure Laterality Date   COLONOSCOPY     HERNIA REPAIR  10/13/11   lap BIH rep w/ mesh- Dr. Tora Kindred HERNIA REPAIR  10/13/2011   Procedure: LAPAROSCOPIC INGUINAL HERNIA;  Surgeon: Adin Hector, MD;  Location: [facility];  Service: General;  Laterality: Bilateral;  Laparoscopic bilateral inguinal hernia repair, converted to open bilateral inguinal hernia repairs with mesh   IR IMAGING GUIDED PORT INSERTION  05/06/2021   NASAL ENDOSCOPY Left 04/08/2021   Procedure: NASAL ENDOSCOPY WITH BIOPSY OF LEFT NASOPPHARYNGEAL MASS;  Surgeon: Jason Coop, DO;  Location: [facility];  Service: ENT;  Laterality: Left;  Frozen Section    SOCIAL HISTORY: Social History   Socioeconomic History   Marital status: Single    Spouse name: Not on file   Number of children: Not on file   Years of education: Not on file   Highest education level: Not on file  Occupational History   Not on file  Tobacco Use   Smoking status: Never   Smokeless tobacco: Never  Vaping Use   Vaping Use: Never used  Substance and Sexual Activity   Alcohol use: No   Drug use: No   Sexual activity: Not on file  Other Topics Concern   Not on file  Social History Narrative   Not on file   Social Determinants of Health   Financial Resource Strain: Not on file  Food Insecurity: Not on file  Transportation Needs: Not on file  Physical Activity: Not on file  Stress: Not on file  Social Connections: Not on file  Intimate Partner Violence: Not on file    FAMILY HISTORY: Family History  Problem Relation Age of Onset   Heart disease Mother     ALLERGIES:  has No Known Allergies.  MEDICATIONS:  Current Outpatient Medications  Medication Sig Dispense Refill   acetaminophen (TYLENOL) 500 MG tablet Take 1,000 mg by mouth every 6 (six) hours as needed for mild pain  or fever.     allopurinol (ZYLOPRIM) 300 MG tablet Take 1 tablet (300 mg total) by mouth daily. 30 tablet 0   amLODipine (NORVASC) 10 MG tablet Take 10 mg by mouth daily.     apixaban (ELIQUIS) 5 MG TABS tablet Take 1 tablet (5 mg total) by mouth 2 (two) times daily. 60 tablet 2   atorvastatin (LIPITOR) 80 MG tablet Take 80 mg by mouth every morning.     ciprofloxacin (CIPRO) 500 MG tablet Take 1 tablet (500 mg total) by mouth 2 (two) times daily. 14 tablet 0   ezetimibe (ZETIA) 10 MG tablet Take 10 mg by mouth every morning.     HYDROcodone-acetaminophen (NORCO/VICODIN) 5-325 MG tablet Take 1 tablet by mouth every 4 (four) hours as needed. 15 tablet 0   lidocaine-prilocaine (EMLA) cream Apply to affected area once 30 g 3   LORazepam (ATIVAN) 0.5 MG tablet Take  1 tablet (0.5 mg total) by mouth every 6 (six) hours as needed (Nausea or vomiting). 30 tablet 0   memantine (NAMENDA) 10 MG tablet Take 1/2 tablet (5 mg) for 1 week at bedtime, then 1 tablet (10 mg) at bedtime. 27 tablet 0   memantine (NAMENDA) 10 MG tablet Take 1 tablet (10 mg total) by mouth at bedtime. 90 tablet 1   ondansetron (ZOFRAN) 8 MG tablet Take 1 tablet (8 mg total) by mouth 2 (two) times daily as needed for refractory nausea / vomiting. Start on day 3 after cyclophosphamide chemotherapy. 30 tablet 1   predniSONE (DELTASONE) 20 MG tablet Take 3 tablets (60 mg total) by mouth daily. Take with food on days 1-5 of chemotherapy. 15 tablet 5   prochlorperazine (COMPAZINE) 10 MG tablet Take 1 tablet (10 mg total) by mouth every 6 (six) hours as needed (Nausea or vomiting). 30 tablet 6   No current facility-administered medications for this visit.    PHYSICAL EXAMINATION:  ECOG PERFORMANCE STATUS: 1/2  Physical Exam Constitutional:      Appearance: Normal appearance.  HENT:     Head: Normocephalic and atraumatic.  Eyes:     Pupils: Pupils are equal, round, and reactive to light.  Cardiovascular:     Rate and Rhythm: Normal  rate and regular rhythm.     Pulses: Normal pulses.     Heart sounds: Normal heart sounds.  Pulmonary:     Effort: Pulmonary effort is normal.     Breath sounds: Normal breath sounds.  Abdominal:     General: Abdomen is flat. Bowel sounds are normal.     Palpations: Abdomen is soft.  Genitourinary:    Comments: I try to examine the site of irritation on her vulva but she started dripping urine everywhere so I cannot really see any major erythema. Musculoskeletal:        General: Swelling (Bilateral lower extremity swelling stable since last visit) present. No tenderness.     Cervical back: Normal range of motion and neck supple. No rigidity.  Lymphadenopathy:     Cervical: No cervical adenopathy.  Skin:    General: Skin is warm and dry.  Neurological:     General: No focal deficit present.     Mental Status: She is alert.  Psychiatric:        Mood and Affect: Mood normal.     LABORATORY DATA:  I have reviewed the data as listed Lab Results  Component Value Date   WBC 9.1 08/26/2021   HGB 10.0 (L) 08/26/2021   HCT 30.9 (L) 08/26/2021   MCV 72.2 (L) 08/26/2021   PLT 299 08/26/2021     Chemistry      Component Value Date/Time   NA 144 08/26/2021 0921   K 3.5 08/26/2021 0921   CL 107 08/26/2021 0921   CO2 27 08/26/2021 0921   BUN 19 08/26/2021 0921   CREATININE 0.60 08/26/2021 0921      Component Value Date/Time   CALCIUM 9.2 08/26/2021 0921   ALKPHOS 168 (H) 08/26/2021 0921   AST 17 08/26/2021 0921   ALT 17 08/26/2021 0921   BILITOT 0.3 08/26/2021 0921      RADIOGRAPHIC STUDIES: I have personally reviewed the radiological images as listed and agreed with the findings in the report. NM PET Image Initial (PI) Skull Base To Thigh  Result Date: 07/31/2021 CLINICAL DATA:  Subsequent treatment strategy for diffuse B-cell lymphoma. EXAM: NUCLEAR MEDICINE PET SKULL BASE TO THIGH TECHNIQUE: 5.4  mCi F-18 FDG was injected intravenously. Full-ring PET imaging was performed  from the skull base to thigh after the radiotracer. CT data was obtained and used for attenuation correction and anatomic localization. Fasting blood glucose: 95 mg/dl COMPARISON:  Multiple exams, including 04/28/2021 FINDINGS: Mediastinal blood pool activity: SUV max 1.4 Liver activity: SUV max 1.9 NECK: Substantial reduction in size and activity of the left posterior nasopharyngeal mass, previously 4.2 by 2.4 cm with maximum SUV of 10.2 and currently 2.0 by 1.0 cm on image 15 of series 4 with maximum SUV 4.9 (Deauville 5). Incidental CT findings: Improved chronic left maxillary sinusitis. Bilateral common carotid atherosclerotic calcification. Stable enlargement and diffuse heterogeneity of the thyroid gland without hypermetabolic activity. CHEST: Index left axillary node measures 0.5 cm in short axis on image 53 of series 4 with maximum SUV 1.2, previously measuring 0.7 cm in short axis with maximum SUV of 2.3. Incidental CT findings: Coronary, aortic arch, and branch vessel atherosclerotic vascular disease. Stable mild bibasilar scarring. Subtle reticulonodular opacity in the lingula on image 34 series 8 appears stable and does not have appreciable hypermetabolic activity. ABDOMEN/PELVIS: Previous hypermetabolic liver lesions are currently hypodense and mildly photopenic. For example, the larger the 2 previous dominant hypermetabolic liver lesions has corresponding hypodensity measuring 2.5 by 2.8 cm on image 85 series 4 and metabolic activity of 1.7 (Deauville 3), previously 6.3. Currently no hypermetabolic liver lesion is observed. Activity just proximal to the right iliac crossover is ascribed to ureteral excretion of radiopharmaceutical. Incidental CT findings: Atherosclerosis is present, including aortoiliac atherosclerotic disease. Suspected small uterine fibroids. Mild diffuse subcutaneous edema. SKELETON: The numerous previous scattered hypermetabolic lesions throughout the axial and appendicular  skeleton no longer demonstrate focal hypermetabolic activity, with corresponding lesions reduced in density compared to previous. The index right iliac bone lesion has a precontrast internal density of 21 Hounsfield units (formerly 38 Hounsfield units) and including intraosseous and extraosseous component measures about 2.9 by 2.0 cm (formerly 2.8 by 2.3 cm), with representative internal SUV of 1.8 (Deauville 3), formerly 7.7. Likewise the other scattered bony lesions are similarly substantially reduced in activity compared to previous. There is some diffuse accentuated activity throughout the skeleton possibly related to granulocyte stimulation. Against this background many of the previous scattered bony lesions are no longer readily apparent. Incidental CT findings: Lucencies related to the prior bony lesions are observed. Prominent bilateral degenerative glenohumeral arthropathy. Healing left anterior third rib fracture, new compared to 04/28/2021. IMPRESSION: 1. Substantial improvement, with notably reduced size and activity of the left posterior nasopharyngeal mass as well as reduced density and substantially reduced activity of the scattered osseous lesions and of the liver lesions. The posterior nasopharyngeal mass is still Deauville 5, although substantially reduced in activity; the index hepatic and bony lesions are currently Deauville 3. 2. Background diffuse accentuated bony activity, query granulocyte stimulation. 3. Stable enlarged somewhat heterogeneous thyroid gland. In the setting of significant comorbidities or limited life expectancy, no follow-up recommended (ref: J Am Coll Radiol. 2015 Feb;12(2): 143-50). 4. Other imaging findings of potential clinical significance: Improved chronic left maxillary sinusitis. Healing left anterior third rib fracture, new compared to 04/28/2021. Aortic Atherosclerosis (ICD10-I70.0). Coronary atherosclerosis. Mild diffuse subcutaneous edema. Electronically Signed    By: Van Clines M.D.   On: 07/31/2021 07:51    I have reviewed the pet imaging results with the patient and compared this to the imaging from June 2022. All questions were answered. The patient knows to call the clinic with any problems,  questions or concerns.  I spent 30 minutes in the care of this patient including reviewing plan of care, labs, discussion about follow-up and treatment plan.   Benay Pike, MD 08/28/2021 10:12 AM

## 2021-09-16 ENCOUNTER — Inpatient Hospital Stay (HOSPITAL_BASED_OUTPATIENT_CLINIC_OR_DEPARTMENT_OTHER): Payer: Medicare HMO | Admitting: Hematology and Oncology

## 2021-09-16 ENCOUNTER — Inpatient Hospital Stay: Payer: Medicare HMO

## 2021-09-16 ENCOUNTER — Other Ambulatory Visit: Payer: Medicare HMO

## 2021-09-16 ENCOUNTER — Other Ambulatory Visit: Payer: Self-pay | Admitting: *Deleted

## 2021-09-16 ENCOUNTER — Other Ambulatory Visit: Payer: Self-pay

## 2021-09-16 VITALS — BP 122/66 | HR 91 | Temp 98.4°F | Resp 16

## 2021-09-16 VITALS — BP 118/67 | HR 84 | Temp 98.3°F | Resp 18 | Ht 59.0 in | Wt 116.5 lb

## 2021-09-16 DIAGNOSIS — C8330 Diffuse large B-cell lymphoma, unspecified site: Secondary | ICD-10-CM | POA: Diagnosis not present

## 2021-09-16 DIAGNOSIS — D649 Anemia, unspecified: Secondary | ICD-10-CM | POA: Diagnosis not present

## 2021-09-16 DIAGNOSIS — Z5112 Encounter for antineoplastic immunotherapy: Secondary | ICD-10-CM | POA: Diagnosis not present

## 2021-09-16 DIAGNOSIS — C8331 Diffuse large B-cell lymphoma, lymph nodes of head, face, and neck: Secondary | ICD-10-CM

## 2021-09-16 DIAGNOSIS — Z5189 Encounter for other specified aftercare: Secondary | ICD-10-CM | POA: Diagnosis not present

## 2021-09-16 DIAGNOSIS — I10 Essential (primary) hypertension: Secondary | ICD-10-CM | POA: Diagnosis not present

## 2021-09-16 DIAGNOSIS — M7989 Other specified soft tissue disorders: Secondary | ICD-10-CM | POA: Diagnosis not present

## 2021-09-16 DIAGNOSIS — Z95828 Presence of other vascular implants and grafts: Secondary | ICD-10-CM

## 2021-09-16 DIAGNOSIS — R32 Unspecified urinary incontinence: Secondary | ICD-10-CM | POA: Diagnosis not present

## 2021-09-16 LAB — CMP (CANCER CENTER ONLY)
ALT: 23 U/L (ref 0–44)
AST: 23 U/L (ref 15–41)
Albumin: 3.7 g/dL (ref 3.5–5.0)
Alkaline Phosphatase: 152 U/L — ABNORMAL HIGH (ref 38–126)
Anion gap: 12 (ref 5–15)
BUN: 23 mg/dL (ref 8–23)
CO2: 22 mmol/L (ref 22–32)
Calcium: 9.1 mg/dL (ref 8.9–10.3)
Chloride: 108 mmol/L (ref 98–111)
Creatinine: 0.6 mg/dL (ref 0.44–1.00)
GFR, Estimated: >60 mL/min (ref >60)
Glucose, Bld: 152 mg/dL — ABNORMAL HIGH (ref 70–99)
Potassium: 3.5 mmol/L (ref 3.5–5.1)
Sodium: 142 mmol/L (ref 135–145)
Total Bilirubin: 0.4 mg/dL (ref 0.3–1.2)
Total Protein: 6.6 g/dL (ref 6.5–8.1)

## 2021-09-16 LAB — CBC WITH DIFFERENTIAL (CANCER CENTER ONLY)
Abs Immature Granulocytes: 0.03 10*3/uL (ref 0.00–0.07)
Basophils Absolute: 0 10*3/uL (ref 0.0–0.1)
Basophils Relative: 0 %
Eosinophils Absolute: 0.1 10*3/uL (ref 0.0–0.5)
Eosinophils Relative: 1 %
HCT: 26.4 % — ABNORMAL LOW (ref 36.0–46.0)
Hemoglobin: 8.8 g/dL — ABNORMAL LOW (ref 12.0–15.0)
Immature Granulocytes: 1 %
Lymphocytes Relative: 18 %
Lymphs Abs: 1.2 10*3/uL (ref 0.7–4.0)
MCH: 23.5 pg — ABNORMAL LOW (ref 26.0–34.0)
MCHC: 33.3 g/dL (ref 30.0–36.0)
MCV: 70.6 fL — ABNORMAL LOW (ref 80.0–100.0)
Monocytes Absolute: 0.6 10*3/uL (ref 0.1–1.0)
Monocytes Relative: 10 %
Neutro Abs: 4.7 10*3/uL (ref 1.7–7.7)
Neutrophils Relative %: 70 %
Platelet Count: 188 10*3/uL (ref 150–400)
RBC: 3.74 MIL/uL — ABNORMAL LOW (ref 3.87–5.11)
RDW: 16.8 % — ABNORMAL HIGH (ref 11.5–15.5)
WBC Count: 6.6 10*3/uL (ref 4.0–10.5)
nRBC: 0 % (ref 0.0–0.2)

## 2021-09-16 LAB — T4, FREE: Free T4: 0.8 ng/dL (ref 0.61–1.12)

## 2021-09-16 MED ORDER — SODIUM CHLORIDE 0.9% FLUSH
10.0000 mL | INTRAVENOUS | Status: DC | PRN
  Administered 2021-09-16: 10 mL

## 2021-09-16 MED ORDER — VINCRISTINE SULFATE CHEMO INJECTION 1 MG/ML
1.0000 mg | Freq: Once | INTRAVENOUS | Status: AC
  Administered 2021-09-16: 1 mg via INTRAVENOUS
  Filled 2021-09-16: qty 1

## 2021-09-16 MED ORDER — PREDNISONE 20 MG PO TABS: 60.0000 mg | ORAL_TABLET | Freq: Every day | ORAL | 0 refills | Status: DC

## 2021-09-16 MED ORDER — ACETAMINOPHEN 325 MG PO TABS
650.0000 mg | ORAL_TABLET | Freq: Once | ORAL | Status: AC
  Administered 2021-09-16: 650 mg via ORAL
  Filled 2021-09-16: qty 2

## 2021-09-16 MED ORDER — SODIUM CHLORIDE 0.9 % IV SOLN
375.0000 mg/m2 | Freq: Once | INTRAVENOUS | Status: AC
  Administered 2021-09-16: 500 mg via INTRAVENOUS
  Filled 2021-09-16: qty 50

## 2021-09-16 MED ORDER — PALONOSETRON HCL INJECTION 0.25 MG/5ML
0.2500 mg | Freq: Once | INTRAVENOUS | Status: AC
  Administered 2021-09-16: 0.25 mg via INTRAVENOUS
  Filled 2021-09-16: qty 5

## 2021-09-16 MED ORDER — SODIUM CHLORIDE 0.9% FLUSH: 10.0000 mL | Freq: Once | INTRAVENOUS | Status: DC

## 2021-09-16 MED ORDER — DOXORUBICIN HCL CHEMO IV INJECTION 2 MG/ML
25.0000 mg/m2 | Freq: Once | INTRAVENOUS | Status: AC
  Administered 2021-09-16: 36 mg via INTRAVENOUS
  Filled 2021-09-16: qty 18

## 2021-09-16 MED ORDER — SODIUM CHLORIDE 0.9 % IV SOLN
150.0000 mg | Freq: Once | INTRAVENOUS | Status: AC
  Administered 2021-09-16: 150 mg via INTRAVENOUS
  Filled 2021-09-16: qty 150

## 2021-09-16 MED ORDER — HEPARIN SOD (PORK) LOCK FLUSH 100 UNIT/ML IV SOLN
500.0000 [IU] | Freq: Once | INTRAVENOUS | Status: AC | PRN
  Administered 2021-09-16: 500 [IU]

## 2021-09-16 MED ORDER — SODIUM CHLORIDE 0.9 % IV SOLN
400.0000 mg/m2 | Freq: Once | INTRAVENOUS | Status: AC
  Administered 2021-09-16: 560 mg via INTRAVENOUS
  Filled 2021-09-16: qty 28

## 2021-09-16 MED ORDER — SODIUM CHLORIDE 0.9 % IV SOLN: Freq: Once | INTRAVENOUS | Status: AC

## 2021-09-16 MED ORDER — SODIUM CHLORIDE 0.9 % IV SOLN
10.0000 mg | Freq: Once | INTRAVENOUS | Status: AC
  Administered 2021-09-16: 10 mg via INTRAVENOUS
  Filled 2021-09-16: qty 10

## 2021-09-16 MED ORDER — DIPHENHYDRAMINE HCL 25 MG PO CAPS
50.0000 mg | ORAL_CAPSULE | Freq: Once | ORAL | Status: AC
  Administered 2021-09-16: 50 mg via ORAL
  Filled 2021-09-16: qty 2

## 2021-09-16 NOTE — Patient Instructions (Signed)
[facility]  Discharge Instructions: Thank you for choosing [facility] to provide your oncology and hematology care.   If you have a lab appointment with the [facility], please go directly to the [facility] and check in at the registration area.   Wear comfortable clothing and clothing appropriate for easy access to any Portacath or PICC line.   We strive to give you quality time with your provider. You may need to reschedule your appointment if you arrive late (15 or more minutes).  Arriving late affects you and other patients whose appointments are after yours.  Also, if you miss three or more appointments without notifying the office, you may be dismissed from the clinic at the provider's discretion.      For prescription refill requests, have your pharmacy contact our office and allow 72 hours for refills to be completed.    Today you received the following chemotherapy and/or immunotherapy agents Doxorubicin, Vincristine, cytoxan, and Rituxan      To help prevent nausea and vomiting after your treatment, we encourage you to take your nausea medication as directed.  BELOW ARE SYMPTOMS THAT SHOULD BE REPORTED IMMEDIATELY: *FEVER GREATER THAN 100.4 F (38 C) OR HIGHER *CHILLS OR SWEATING *NAUSEA AND VOMITING THAT IS NOT CONTROLLED WITH YOUR NAUSEA MEDICATION *UNUSUAL SHORTNESS OF BREATH *UNUSUAL BRUISING OR BLEEDING *URINARY PROBLEMS (pain or burning when urinating, or frequent urination) *BOWEL PROBLEMS (unusual diarrhea, constipation, pain near the anus) TENDERNESS IN MOUTH AND THROAT WITH OR WITHOUT PRESENCE OF ULCERS (sore throat, sores in mouth, or a toothache) UNUSUAL RASH, SWELLING OR PAIN  UNUSUAL VAGINAL DISCHARGE OR ITCHING   Items with * indicate a potential emergency and should be followed up as soon as possible or go to the Emergency Department if any problems should occur.  Please show the CHEMOTHERAPY ALERT CARD or  IMMUNOTHERAPY ALERT CARD at check-in to the Emergency Department and triage nurse.  Should you have questions after your visit or need to cancel or reschedule your appointment, please contact [facility]  Dept: [phone]  and follow the prompts.  Office hours are 8:00 a.m. to 4:30 p.m. Monday - Friday. Please note that voicemails left after 4:00 p.m. may not be returned until the following business day.  We are closed weekends and major holidays. You have access to a nurse at all times for urgent questions. Please call the main number to the clinic Dept: [phone] and follow the prompts.   For any non-urgent questions, you may also contact your provider using MyChart. We now offer e-Visits for anyone 85 and older to request care online for non-urgent symptoms. For details visit [email or web address].   Also download the MyChart app! Go to the app store, search "MyChart", open the app, select [facility], and log in with your MyChart username and password.  Due to Covid, a mask is required upon entering the hospital/clinic. If you do not have a mask, one will be given to you upon arrival. For doctor visits, patients may have 1 support person aged 85 or older with them. For treatment visits, patients cannot have anyone with them due to current Covid guidelines and our immunocompromised population.

## 2021-09-16 NOTE — Progress Notes (Signed)
[facility] CONSULT NOTE  Patient Care Team: Antony Contras, MD as PCP - General (Family Medicine)  CHIEF COMPLAINTS/PURPOSE OF CONSULTATION:  DLBCL of nasopharynx.  ASSESSMENT & PLAN:   GCB subtype, DLBCL of nasopharynx, Stage IV  Diffuse large B-cell lymphoma (HCC) This is a very pleasant 87 year old female patient with diffuse large B-cell lymphoma of the nasopharynx with a metastatic disease noticed on imaging who is here for a follow-up after cycle 5 of R mini CHOP.  She has been doing quite well since initiation of chemotherapy.  No major toxicities to report. Physical examination quite unremarkable except for some persistent bilateral lower extremity swelling and some ongoing urinary incontinence.   I have reviewed his CBC and CMP, labs okay to proceed with treatment as planned today She had an interim PET/CT after 3 cycles which showed remarkable response.  Plan is to continue 6 cycles of R mini CHOP and repeat PET scan at the end of the treatment. Dr. Chryl Heck was considering intrathecal methotrexate prophylaxis at the end of the treatment. I will defer that treatment decision to her, though I do not favor that treatment in this patient.    Urinary incontinence --I do not believe this is a symptom related to her lymphoma or her chemotherapy.   --Since this is a chronic complaint and no evidence of systemic signs of infection, I do not believe this is an active urinary tract infection.   --Discussed the importance of keeping personal hygiene as a priority since she is a very high risk for infection.   Normocytic normochromic anemia This has continued to improve since we initiated chemotherapy.  There is no indication for transfusion.  We will continue to monitor this.  Previously Dr. Chryl Heck discussed CNS prophylaxis. Given her advanced age I do not think that the benefits justify the risk in an 87 year old patient. I would not pursue this additional therapy, but will  defer the final decision to Dr. Chryl Heck. Per her previous note: Although she might benefit from CNS prophylaxis, she is a very high risk for methotrexate infusions given her age and performance status.  we will monitor closely and if she continues to do well at the end of the treatment can consider intrathecal methotrexate.  I have discussed this today with her son and the patient.  We will consider this approach once she finishes planned 6 cycles of R mini CHOP.  HISTORY OF PRESENTING ILLNESS:   [name] 87 y.o. female is here because of new diagnosis of DLBCL of nasopharynx.  Ms. Hefner is a very pleasant 87 year old female patient who arrived today with her granddaughter Ms. Amber to the appointment.  She does not quite remember things as explained and has short-term memory loss.  After a bit of the conversation, she did admit to me that she was told about a cancer in her nose which was biopsied last week but she cannot remember the details.  [name] mentioned that for the past year or so grandma has been having more nasal drainage and has progressively lost weight in the past couple months of at least 20 pounds, has become relatively more sedentary and inactive, has no appetite.  They initially thought she might be having a stroke when she was not getting out of bed for 3days and has not been eating.  When she was in the ER.  She had some imaging CT head without contrast which showed left maxillary and ethmoid sinus disease. She then  had MRI brain on the same day which showed 3.2 x 2 x 3.5 cm heterogeneous mass positioned at the left nasopharynx near the fossa of Rosenmuller.  Finding is most concerning for a primary nasopharyngeal neoplasm.  ENT consultation was recommended.  She then went on to see [name].She had nasal endoscopy with biopsy of left nasopharyngeal mass.  According to [name], polypoid mass lesion was noted to be lymphoma from the posterior aspect of the left middle  turbinate.  Pathology showed findings consistent with diffuse large B-cell lymphoma of the left nasopharyngeal mass as well as left middle turbinate.   PET/CT showed hypermetabolic left nasopharyngeal mass, no associated neck adenopathy, 2 large hypermetabolic liver lesions, destructive lytic metastatic bone lesions.  Since there was no lymphadenopathy and no unusual appearance of spleen, second biopsy was recommended to confirm the diagnosis.  Left ilium  Biopsy showed findings consistent with involvement by non-Hodgkin B-cell lymphoma.  Features are similar to previously known large B-cell lymphoma consistent with involvement by the same disease process.  Cycle day 1 of R mini CHOP on 06/03/2021. C2D1 06/23/2021 Cycle 3-day 1 completed on July 15, 2021 She had an interim PET/CT which showed remarkable response. C4D1 on 08/05/2021 C5D1 on 08/26/2021 C6D1 scheduled for today.   Interval History On exam today Ms. Mcilrath is accompanied by her son.  She reports that she has been well in the interim since her last visit.  She reports that she tolerated the last cycle of chemotherapy without any difficulty.  She denies having any issues with nausea, vomiting, or diarrhea.  She did lose her hair.  She notes that she has gained approximately 3 to 4 pounds in the interim since her last visit.  She is ambulating well with a cane.  She denies any numbness or tingling of her fingers and toes.  Her energy levels are good.  A full 10 point ROS is listed below.   MEDICAL HISTORY:  Past Medical History:  Diagnosis Date   Anemia    Arthritis    knees    GERD (gastroesophageal reflux disease)    Hiatal hernia    History of inguinal hernia repair    BIH   Hyperlipidemia    Hypertension    Memory problem     SURGICAL HISTORY: Past Surgical History:  Procedure Laterality Date   COLONOSCOPY     HERNIA REPAIR  10/13/11   lap BIH rep w/ mesh- Dr. Tora Kindred HERNIA REPAIR  10/13/2011    Procedure: LAPAROSCOPIC INGUINAL HERNIA;  Surgeon: Adin Hector, MD;  Location: [facility];  Service: General;  Laterality: Bilateral;  Laparoscopic bilateral inguinal hernia repair, converted to open bilateral inguinal hernia repairs with mesh   IR IMAGING GUIDED PORT INSERTION  05/06/2021   NASAL ENDOSCOPY Left 04/08/2021   Procedure: NASAL ENDOSCOPY WITH BIOPSY OF LEFT NASOPPHARYNGEAL MASS;  Surgeon: Jason Coop, DO;  Location: [facility];  Service: ENT;  Laterality: Left;  Frozen Section    SOCIAL HISTORY: Social History   Socioeconomic History   Marital status: Single    Spouse name: Not on file   Number of children: Not on file   Years of education: Not on file   Highest education level: Not on file  Occupational History   Not on file  Tobacco Use   Smoking status: Never   Smokeless tobacco: Never  Vaping Use   Vaping Use: Never used  Substance and Sexual Activity   Alcohol use:  No   Drug use: No   Sexual activity: Not on file  Other Topics Concern   Not on file  Social History Narrative   Not on file   Social Determinants of Health   Financial Resource Strain: Not on file  Food Insecurity: Not on file  Transportation Needs: Not on file  Physical Activity: Not on file  Stress: Not on file  Social Connections: Not on file  Intimate Partner Violence: Not on file    FAMILY HISTORY: Family History  Problem Relation Age of Onset   Heart disease Mother     ALLERGIES:  has No Known Allergies.  MEDICATIONS:  Current Outpatient Medications  Medication Sig Dispense Refill   acetaminophen (TYLENOL) 500 MG tablet Take 1,000 mg by mouth every 6 (six) hours as needed for mild pain or fever.     allopurinol (ZYLOPRIM) 300 MG tablet Take 1 tablet (300 mg total) by mouth daily. 30 tablet 0   amLODipine (NORVASC) 10 MG tablet Take 10 mg by mouth daily.     apixaban (ELIQUIS) 5 MG TABS tablet Take 1 tablet (5 mg total) by mouth 2 (two) times daily. 60 tablet 2    atorvastatin (LIPITOR) 80 MG tablet Take 80 mg by mouth every morning.     ezetimibe (ZETIA) 10 MG tablet Take 10 mg by mouth every morning.     HYDROcodone-acetaminophen (NORCO/VICODIN) 5-325 MG tablet Take 1 tablet by mouth every 4 (four) hours as needed. 15 tablet 0   lidocaine-prilocaine (EMLA) cream Apply to affected area once 30 g 3   LORazepam (ATIVAN) 0.5 MG tablet Take 1 tablet (0.5 mg total) by mouth every 6 (six) hours as needed (Nausea or vomiting). 30 tablet 0   memantine (NAMENDA) 10 MG tablet Take 1/2 tablet (5 mg) for 1 week at bedtime, then 1 tablet (10 mg) at bedtime. 27 tablet 0   memantine (NAMENDA) 10 MG tablet Take 1 tablet (10 mg total) by mouth at bedtime. 90 tablet 1   ondansetron (ZOFRAN) 8 MG tablet Take 1 tablet (8 mg total) by mouth 2 (two) times daily as needed for refractory nausea / vomiting. Start on day 3 after cyclophosphamide chemotherapy. 30 tablet 1   predniSONE (DELTASONE) 20 MG tablet Take 3 tablets (60 mg total) by mouth daily. Take with food on days 1-5 of chemotherapy. 15 tablet 0   prochlorperazine (COMPAZINE) 10 MG tablet Take 1 tablet (10 mg total) by mouth every 6 (six) hours as needed (Nausea or vomiting). 30 tablet 6   No current facility-administered medications for this visit.   Facility-Administered Medications Ordered in Other Visits  Medication Dose Route Frequency Provider Last Rate Last Admin   heparin lock flush 100 unit/mL  500 Units Intracatheter Once PRN Ledell Peoples IV, MD       sodium chloride flush (NS) 0.9 % injection 10 mL  10 mL Intracatheter PRN Orson Slick, MD        PHYSICAL EXAMINATION:  ECOG PERFORMANCE STATUS: 1/2  Physical Exam Constitutional:      Appearance: Normal appearance.  HENT:     Head: Normocephalic and atraumatic.  Eyes:     Pupils: Pupils are equal, round, and reactive to light.  Cardiovascular:     Rate and Rhythm: Normal rate and regular rhythm.     Pulses: Normal pulses.     Heart sounds:  Normal heart sounds.  Pulmonary:     Effort: Pulmonary effort is normal.  Breath sounds: Normal breath sounds.  Abdominal:     General: Abdomen is flat. Bowel sounds are normal.     Palpations: Abdomen is soft.  Genitourinary:    Comments: I try to examine the site of irritation on her vulva but she started dripping urine everywhere so I cannot really see any major erythema. Musculoskeletal:        General: Swelling (Bilateral lower extremity swelling stable since last visit) present. No tenderness.     Cervical back: Normal range of motion and neck supple. No rigidity.  Lymphadenopathy:     Cervical: No cervical adenopathy.  Skin:    General: Skin is warm and dry.  Neurological:     General: No focal deficit present.     Mental Status: She is alert.  Psychiatric:        Mood and Affect: Mood normal.     LABORATORY DATA:  I have reviewed the data as listed Lab Results  Component Value Date   WBC 6.6 09/16/2021   HGB 8.8 (L) 09/16/2021   HCT 26.4 (L) 09/16/2021   MCV 70.6 (L) 09/16/2021   PLT 188 09/16/2021     Chemistry      Component Value Date/Time   NA 142 09/16/2021 1104   K 3.5 09/16/2021 1104   CL 108 09/16/2021 1104   CO2 22 09/16/2021 1104   BUN 23 09/16/2021 1104   CREATININE 0.60 09/16/2021 1104      Component Value Date/Time   CALCIUM 9.1 09/16/2021 1104   ALKPHOS 152 (H) 09/16/2021 1104   AST 23 09/16/2021 1104   ALT 23 09/16/2021 1104   BILITOT 0.4 09/16/2021 1104      RADIOGRAPHIC STUDIES: No results found.  All questions were answered. The patient knows to call the clinic with any problems, questions or concerns.  I spent 30 minutes in the care of this patient including reviewing plan of care, labs, discussion about follow-up and treatment plan.  Ledell Peoples, MD Department of Hematology/Oncology [facility] Phone: [phone] Pager: [phone] Email: [email or web address]

## 2021-09-18 ENCOUNTER — Inpatient Hospital Stay: Payer: Medicare HMO

## 2021-09-18 ENCOUNTER — Other Ambulatory Visit
Admission: RE | Admit: 2021-09-18 | Discharge: 2021-09-18 | Disposition: A | Discharge status: Home / Self Care | Payer: Medicare HMO | Source: Ambulatory Visit | Attending: Family Medicine | Admitting: Family Medicine

## 2021-09-18 ENCOUNTER — Other Ambulatory Visit: Payer: Self-pay

## 2021-09-18 VITALS — BP 149/77 | HR 85 | Temp 98.9°F | Resp 16

## 2021-09-18 DIAGNOSIS — Z5189 Encounter for other specified aftercare: Secondary | ICD-10-CM | POA: Diagnosis not present

## 2021-09-18 DIAGNOSIS — C8331 Diffuse large B-cell lymphoma, lymph nodes of head, face, and neck: Secondary | ICD-10-CM | POA: Diagnosis not present

## 2021-09-18 DIAGNOSIS — R600 Localized edema: Secondary | ICD-10-CM | POA: Insufficient documentation

## 2021-09-18 DIAGNOSIS — M7989 Other specified soft tissue disorders: Secondary | ICD-10-CM | POA: Diagnosis not present

## 2021-09-18 DIAGNOSIS — I10 Essential (primary) hypertension: Secondary | ICD-10-CM | POA: Diagnosis not present

## 2021-09-18 DIAGNOSIS — D649 Anemia, unspecified: Secondary | ICD-10-CM | POA: Diagnosis not present

## 2021-09-18 DIAGNOSIS — L89303 Pressure ulcer of unspecified buttock, stage 3: Secondary | ICD-10-CM | POA: Diagnosis not present

## 2021-09-18 DIAGNOSIS — C8330 Diffuse large B-cell lymphoma, unspecified site: Secondary | ICD-10-CM

## 2021-09-18 DIAGNOSIS — R399 Unspecified symptoms and signs involving the genitourinary system: Secondary | ICD-10-CM | POA: Diagnosis not present

## 2021-09-18 DIAGNOSIS — R32 Unspecified urinary incontinence: Secondary | ICD-10-CM | POA: Diagnosis not present

## 2021-09-18 DIAGNOSIS — D72829 Elevated white blood cell count, unspecified: Secondary | ICD-10-CM | POA: Diagnosis not present

## 2021-09-18 DIAGNOSIS — R0600 Dyspnea, unspecified: Secondary | ICD-10-CM | POA: Insufficient documentation

## 2021-09-18 DIAGNOSIS — Z5112 Encounter for antineoplastic immunotherapy: Secondary | ICD-10-CM | POA: Diagnosis not present

## 2021-09-18 LAB — BRAIN NATRIURETIC PEPTIDE: B Natriuretic Peptide: 105.8 pg/mL — ABNORMAL HIGH (ref 0.0–100.0)

## 2021-09-18 MED ORDER — PEGFILGRASTIM-CBQV 6 MG/0.6ML ~~LOC~~ SOSY
6.0000 mg | PREFILLED_SYRINGE | Freq: Once | SUBCUTANEOUS | Status: AC
  Administered 2021-09-18: 6 mg via SUBCUTANEOUS
  Filled 2021-09-18: qty 0.6

## 2021-09-18 NOTE — Patient Instructions (Signed)
Pegfilgrastim injection What is this medication? PEGFILGRASTIM (PEG fil gra stim) is a [place]-acting granulocyte colony-stimulating factor that stimulates the growth of neutrophils, a type of white blood cell important in the body's fight against infection. It is used to reduce the incidence of fever and infection in patients with certain types of cancer who are receiving chemotherapy that affects the bone marrow, and to increase survival after being exposed to high doses of radiation. This medicine may be used for other purposes; ask your health care provider or pharmacist if you have questions. COMMON BRAND NAME(S): Fulphila, Neulasta, Nyvepria, UDENYCA, Ziextenzo What should I tell my care team before I take this medication? They need to know if you have any of these conditions: kidney disease latex allergy ongoing radiation therapy sickle cell disease skin reactions to acrylic adhesives (On-Body Injector only) an unusual or allergic reaction to pegfilgrastim, filgrastim, other medicines, foods, dyes, or preservatives pregnant or trying to get pregnant breast-feeding How should I use this medication? This medicine is for injection under the skin. If you get this medicine at home, you will be taught how to prepare and give the pre-filled syringe or how to use the On-body Injector. Refer to the patient Instructions for Use for detailed instructions. Use exactly as directed. Tell your healthcare provider immediately if you suspect that the On-body Injector may not have performed as intended or if you suspect the use of the On-body Injector resulted in a missed or partial dose. It is important that you put your used needles and syringes in a special sharps container. Do not put them in a trash can. If you do not have a sharps container, call your pharmacist or healthcare provider to get one. Talk to your pediatrician regarding the use of this medicine in children. While this drug may be prescribed for  selected conditions, precautions do apply. Overdosage: If you think you have taken too much of this medicine contact a poison control center or emergency room at once. NOTE: This medicine is only for you. Do not share this medicine with others. What if I miss a dose? It is important not to miss your dose. Call your doctor or health care professional if you miss your dose. If you miss a dose due to an On-body Injector failure or leakage, a new dose should be administered as soon as possible using a single prefilled syringe for manual use. What may interact with this medication? Interactions have not been studied. This list may not describe all possible interactions. Give your health care provider a list of all the medicines, herbs, non-prescription drugs, or dietary supplements you use. Also tell them if you smoke, drink alcohol, or use illegal drugs. Some items may interact with your medicine. What should I watch for while using this medication? Your condition will be monitored carefully while you are receiving this medicine. You may need blood work done while you are taking this medicine. Talk to your health care provider about your risk of cancer. You may be more at risk for certain types of cancer if you take this medicine. If you are going to need a MRI, CT scan, or other procedure, tell your doctor that you are using this medicine (On-Body Injector only). What side effects may I notice from receiving this medication? Side effects that you should report to your doctor or health care professional as soon as possible: allergic reactions (skin rash, itching or hives, swelling of the face, lips, or tongue) back pain dizziness fever pain,   redness, or irritation at site where injected pinpoint red spots on the skin red or dark-brown urine shortness of breath or breathing problems stomach or side pain, or pain at the shoulder swelling tiredness trouble passing urine or change in the amount of  urine unusual bruising or bleeding Side effects that usually do not require medical attention (report to your doctor or health care professional if they continue or are bothersome): bone pain muscle pain This list may not describe all possible side effects. Call your doctor for medical advice about side effects. You may report side effects to FDA at 1-800-FDA-1088. Where should I keep my medication? Keep out of the reach of children. If you are using this medicine at home, you will be instructed on how to store it. Throw away any unused medicine after the expiration date on the label. NOTE: This sheet is a summary. It may not cover all possible information. If you have questions about this medicine, talk to your doctor, pharmacist, or health care provider.  2022 Elsevier/Gold Standard (2020-12-05 11:54:14)  

## 2021-09-23 DIAGNOSIS — C8330 Diffuse large B-cell lymphoma, unspecified site: Secondary | ICD-10-CM | POA: Diagnosis not present

## 2021-09-23 DIAGNOSIS — E441 Mild protein-calorie malnutrition: Secondary | ICD-10-CM | POA: Diagnosis not present

## 2021-09-23 DIAGNOSIS — R32 Unspecified urinary incontinence: Secondary | ICD-10-CM | POA: Diagnosis not present

## 2021-09-23 DIAGNOSIS — L89153 Pressure ulcer of sacral region, stage 3: Secondary | ICD-10-CM | POA: Diagnosis not present

## 2021-09-23 DIAGNOSIS — Z23 Encounter for immunization: Secondary | ICD-10-CM | POA: Diagnosis not present

## 2021-10-02 ENCOUNTER — Ambulatory Visit
Admission: RE | Admit: 2021-10-02 | Discharge: 2021-10-02 | Disposition: A | Discharge status: Home / Self Care | Payer: Medicare HMO | Source: Ambulatory Visit | Attending: Family Medicine | Admitting: Family Medicine

## 2021-10-02 DIAGNOSIS — M85852 Other specified disorders of bone density and structure, left thigh: Secondary | ICD-10-CM

## 2021-10-02 DIAGNOSIS — Z780 Asymptomatic menopausal state: Secondary | ICD-10-CM | POA: Diagnosis not present

## 2021-10-02 DIAGNOSIS — M810 Age-related osteoporosis without current pathological fracture: Secondary | ICD-10-CM | POA: Diagnosis not present

## 2021-10-05 ENCOUNTER — Other Ambulatory Visit: Payer: Self-pay

## 2021-10-05 ENCOUNTER — Encounter: Payer: Medicare HMO | Attending: Physician Assistant | Admitting: Physician Assistant

## 2021-10-05 DIAGNOSIS — F0150 Vascular dementia without behavioral disturbance: Secondary | ICD-10-CM | POA: Insufficient documentation

## 2021-10-05 DIAGNOSIS — L98412 Non-pressure chronic ulcer of buttock with fat layer exposed: Secondary | ICD-10-CM | POA: Insufficient documentation

## 2021-10-05 DIAGNOSIS — Z7901 Long term (current) use of anticoagulants: Secondary | ICD-10-CM | POA: Diagnosis not present

## 2021-10-05 DIAGNOSIS — L0231 Cutaneous abscess of buttock: Secondary | ICD-10-CM | POA: Diagnosis not present

## 2021-10-05 DIAGNOSIS — I10 Essential (primary) hypertension: Secondary | ICD-10-CM | POA: Diagnosis not present

## 2021-10-05 DIAGNOSIS — L02212 Cutaneous abscess of back [any part, except buttock]: Secondary | ICD-10-CM | POA: Diagnosis not present

## 2021-10-07 NOTE — Progress Notes (Signed)
[name], [name]. ([id number]) Visit Report for 10/05/2021 Abuse/Suicide Risk Screen Details Patient Name: [name], [name]. Date of Service: 10/05/2021 12:45 PM Medical Record Number: [id number] Patient Account Number: [id number] Date of Birth/Sex: 04-08-1932 (87 y.o. F) Treating RN: Cornell Barman Primary Care Ionna Avis: Antony Contras Other Clinician: Referring Arletha Marschke: Antony Contras Treating Kileigh Ortmann/Extender: Skipper Cliche in Treatment: 0 Abuse/Suicide Risk Screen Items Answer ABUSE RISK SCREEN: Has anyone close to you tried to hurt or harm you recentlyo No Do you feel uncomfortable with anyone in your familyo No Has anyone forced you do things that you didnot want to doo No Electronic Signature(s) Signed: 10/07/2021 4:58:16 PM By: Gretta Cool, BSN, RN, CWS, Kim RN, BSN Entered By: Gretta Cool, BSN, RN, CWS, Kim on 10/05/2021 13:28:16 [name], [name]. ([id number]) -------------------------------------------------------------------------------- Activities of Daily Living Details Patient Name: [name], [name]. Date of Service: 10/05/2021 12:45 PM Medical Record Number: [id number] Patient Account Number: [id number] Date of Birth/Sex: November 15, 1932 (87 y.o. F) Treating RN: Cornell Barman Primary Care Tyshea Imel: Antony Contras Other Clinician: Referring Dessie Tatem: Antony Contras Treating Jazae Gandolfi/Extender: Skipper Cliche in Treatment: 0 Activities of Daily Living Items Answer Activities of Daily Living (Please select one for each item) Drive Automobile Need Assistance Take Medications Need Assistance Use Telephone Need Assistance Care for Appearance Need Assistance Use Toilet Need Counsellor / Shower Need Assistance Dress Self Need Assistance Feed Self Need Assistance Walk Need Assistance Get In / Out Bed Need Assistance Housework Need Assistance Prepare Meals Need Assistance Handle Money Need Assistance Shop for Self Need Assistance Electronic Signature(s) Signed: 10/07/2021 4:58:16 PM By: Gretta Cool,  BSN, RN, CWS, Kim RN, BSN Entered By: Gretta Cool, BSN, RN, CWS, Kim on 10/05/2021 13:28:50 [name], [name]. ([id number]) -------------------------------------------------------------------------------- Education Screening Details Patient Name: [name], [name]. Date of Service: 10/05/2021 12:45 PM Medical Record Number: [id number] Patient Account Number: [id number] Date of Birth/Sex: 15-May-1932 (87 y.o. F) Treating RN: Cornell Barman Primary Care Zeynep Fantroy: Antony Contras Other Clinician: Referring Keylen Eckenrode: Antony Contras Treating Cindia Hustead/Extender: Skipper Cliche in Treatment: 0 Primary Learner Assessed: Patient Learning Preferences/Education Level/Primary Language Learning Preference: Explanation, Demonstration Highest Education Level: High School Preferred Language: English Cognitive Barrier Language Barrier: No Translator Needed: No Memory Deficit: No Emotional Barrier: No Physical Barrier Impaired Vision: No Impaired Hearing: No Decreased Hand dexterity: No Knowledge/Comprehension Knowledge Level: Medium Comprehension Level: Medium Ability to understand written instructions: Medium Ability to understand verbal instructions: Medium Motivation Anxiety Level: Calm Cooperation: Cooperative Education Importance: Acknowledges Need Interest in Health Problems: Asks Questions Perception: Coherent Willingness to Engage in Self-Management High Activities: Readiness to Engage in Self-Management High Activities: Electronic Signature(s) Signed: 10/07/2021 4:58:16 PM By: Gretta Cool, BSN, RN, CWS, Kim RN, BSN Entered By: Gretta Cool, BSN, RN, CWS, Kim on 10/05/2021 13:29:30 [name], [name]. ([id number]) -------------------------------------------------------------------------------- Fall Risk Assessment Details Patient Name: [name], [name]. Date of Service: 10/05/2021 12:45 PM Medical Record Number: [id number] Patient Account Number: [id number] Date of Birth/Sex: [birth year] (87 y.o. F) Treating RN: Cornell Barman Primary Care Cael Worth: Antony Contras Other Clinician: Referring Celene Pippins: Antony Contras Treating Ofilia Rayon/Extender: Skipper Cliche in Treatment: 0 Fall Risk Assessment Items Have you had 2 or more falls in the last 12 monthso 0 Yes Have you had any fall that resulted in injury in the last 12 monthso 0 No FALLS RISK SCREEN History of falling - immediate or within 3 months 0 No Secondary diagnosis (Do you have 2 or more medical diagnoseso) 0 No Ambulatory aid None/bed rest/wheelchair/nurse 0 Yes Crutches/cane/walker 0 No Furniture 0 No Intravenous therapy  Access/Saline/Heparin Lock 0 No Gait/Transferring Normal/ bed rest/ wheelchair 0 Yes Weak (short steps with or without shuffle, stooped but able to lift head while walking, may 0 No seek support from furniture) Impaired (short steps with shuffle, may have difficulty arising from chair, head down, impaired 0 No balance) Mental Status Oriented to own ability 0 Yes Electronic Signature(s) Signed: 10/07/2021 4:58:16 PM By: Gretta Cool, BSN, RN, CWS, Kim RN, BSN Entered By: Gretta Cool, BSN, RN, CWS, Kim on 10/05/2021 13:29:59 [name], [name]. ([id number]) -------------------------------------------------------------------------------- Foot Assessment Details Patient Name: [name], [name]. Date of Service: 10/05/2021 12:45 PM Medical Record Number: [id number] Patient Account Number: [id number] Date of Birth/Sex: 1932-03-16 (87 y.o. F) Treating RN: Cornell Barman Primary Care Alixandra Alfieri: Antony Contras Other Clinician: Referring Arvilla Salada: Antony Contras Treating Trysten Bernard/Extender: Skipper Cliche in Treatment: 0 Foot Assessment Items Site Locations + = Sensation present, - = Sensation absent, C = Callus, U = Ulcer R = Redness, W = Warmth, M = Maceration, PU = Pre-ulcerative lesion F = Fissure, S = Swelling, D = Dryness Assessment Right: Left: Other Deformity: No No Prior Foot Ulcer: No No Prior Amputation: No No Charcot Joint: No  No Ambulatory Status: Ambulatory Without Help Gait: Steady Electronic Signature(s) Signed: 10/07/2021 4:58:16 PM By: Gretta Cool, BSN, RN, CWS, Kim RN, BSN Entered By: Gretta Cool, BSN, RN, CWS, Kim on 10/05/2021 13:30:39 [name], [name]. ([id number]) -------------------------------------------------------------------------------- Nutrition Risk Screening Details Patient Name: [name], [name]. Date of Service: 10/05/2021 12:45 PM Medical Record Number: [id number] Patient Account Number: [id number] Date of Birth/Sex: [birth year] (87 y.o. F) Treating RN: Cornell Barman Primary Care Kalina Morabito: Antony Contras Other Clinician: Referring Janai Maudlin: Antony Contras Treating Taniqua Issa/Extender: Skipper Cliche in Treatment: 0 Height (in): 60 Weight (lbs): 116 Body Mass Index (BMI): 22.7 Nutrition Risk Screening Items Score Screening NUTRITION RISK SCREEN: I have an illness or condition that made me change the kind and/or amount of food I eat 0 No I eat fewer than two meals per day 0 No I eat few fruits and vegetables, or milk products 0 No I have three or more drinks of beer, liquor or wine almost every day 0 No I have tooth or mouth problems that make it hard for me to eat 0 No I don't always have enough money to buy the food I need 0 No I eat alone most of the time 0 No I take three or more different prescribed or over-the-counter drugs a day 1 Yes Without wanting to, I have lost or gained 10 pounds in the last six months 2 Yes I am not always physically able to shop, cook and/or feed myself 0 No Nutrition Protocols Good Risk Protocol Moderate Risk Protocol 0 Provide education on nutrition High Risk Proctocol Risk Level: Moderate Risk Score: 3 Electronic Signature(s) Signed: 10/07/2021 4:58:16 PM By: Gretta Cool, BSN, RN, CWS, Kim RN, BSN Entered By: Gretta Cool, BSN, RN, CWS, Kim on 10/05/2021 13:30:30

## 2021-10-07 NOTE — Progress Notes (Signed)
Helminiak, Annjeanette I. ([id number]) Visit Report for 10/05/2021 Chief Complaint Document Details Patient Name: [name], [name]. Date of Service: 10/05/2021 12:45 PM Medical Record Number: [id number] Patient Account Number: [id number] Date of Birth/Sex: [birth year] (87 y.o. F) Treating RN: [name] Primary Care Provider: Antony Contras Other Clinician: Referring Provider: Antony Contras Treating Provider/Extender: [name] in Treatment: 0 Information Obtained from: Patient Chief Complaint Sacral pressure ulcer Electronic Signature(s) Signed: 10/05/2021 1:47:38 PM By: Worthy Keeler PA-C Entered By: Worthy Keeler on 10/05/2021 13:47:38 [name], [name] ([id number]) -------------------------------------------------------------------------------- Debridement Details Patient Name: [name], [name]. Date of Service: 10/05/2021 12:45 PM Medical Record Number: [id number] Patient Account Number: [id number] Date of Birth/Sex: 1932/11/16 (87 y.o. F) Treating RN: [name] Primary Care Provider: Antony Contras Other Clinician: Referring Provider: Antony Contras Treating Provider/Extender: [name] in Treatment: 0 Debridement Performed for Wound #1 Midline Sacrum Assessment: Performed By: Physician Tommie Sams., PA-C Debridement Type: Debridement Level of Consciousness (Pre- Awake and Alert procedure): Pre-procedure Verification/Time Out Yes - 13:55 Taken: Pain Control: Lidocaine Total Area Debrided (L x W): 3.5 (cm) x 2 (cm) = 7 (cm) Tissue and other material Slough, Subcutaneous, Biofilm, Slough debrided: Level: Skin/Subcutaneous Tissue Debridement Description: Excisional Instrument: Curette Bleeding: Minimum Hemostasis Achieved: Pressure Response to Treatment: Procedure was tolerated well Level of Consciousness (Post- Awake and Alert procedure): Post Debridement Measurements of Total Wound Length: (cm) 3.5 Width: (cm) 2 Depth: (cm) 0.3 Volume: (cm) 1.649 Character of  Wound/Ulcer Post Debridement: Stable Post Procedure Diagnosis Same as Pre-procedure Electronic Signature(s) Signed: 10/05/2021 5:26:28 PM By: Worthy Keeler PA-C Signed: 10/07/2021 4:58:16 PM By: Gretta Cool, BSN, RN, CWS, Kim RN, BSN Entered By: Gretta Cool, BSN, RN, CWS, [name] on 10/05/2021 13:57:54 [name], [name] ([id number]) -------------------------------------------------------------------------------- HPI Details Patient Name: [name], [name]. Date of Service: 10/05/2021 12:45 PM Medical Record Number: [id number] Patient Account Number: [id number] Date of Birth/Sex: 1932/10/03 (87 y.o. F) Treating RN: [name] Primary Care Provider: Antony Contras Other Clinician: Referring Provider: Antony Contras Treating Provider/Extender: [name] in Treatment: 0 History of Present Illness HPI Description: 10/05/2021 patient presents today for a wound that actually began on around July 06, 2021. This appears initially to have been she tells me a blister that came up and then subsequently open. My concern here is that this was probably more of an abscess than a pressure ulcer she is actually a fairly active individual she does not lay in bed all the time she does ambulate and again I think pressure is a much less likely because of this to be perfectly honest that an abscess. Nonetheless I see no signs of infection right now she does have some slough buildup on the surface of the wound but nothing too significant which is great news. No fevers, chills, nausea, vomiting, or diarrhea. The patient does have a history otherwise of hypertension, Aldrete-term use of anticoagulant therapy, and she does have some dementia. Electronic Signature(s) Signed: 10/06/2021 4:37:51 PM By: Worthy Keeler PA-C Previous Signature: 10/05/2021 5:20:13 PM Version By: Worthy Keeler PA-C Entered By: Worthy Keeler on 10/06/2021 16:37:51 [name], [name].  ([id number]) -------------------------------------------------------------------------------- Physical Exam Details Patient Name: [name], [name] Date of Service: 10/05/2021 12:45 PM Medical Record Number: [id number] Patient Account Number: [id number] Date of Birth/Sex: 04-04-1932 (87 y.o. F) Treating RN: [name] Primary Care Provider: Antony Contras Other Clinician: Referring Provider: Antony Contras Treating Provider/Extender: [name] in Treatment: 0 Constitutional patient is hypertensive.. pulse regular and within target range  for patient.Marland Kitchen respirations regular, non-labored and within target range for patient.Marland Kitchen temperature within target range for patient.. Well-nourished and well-hydrated in no acute distress. Eyes conjunctiva clear no eyelid edema noted. pupils equal round and reactive to light and accommodation. Ears, Nose, Mouth, and Throat no gross abnormality of ear auricles or external auditory canals. normal hearing noted during conversation. mucus membranes moist. Respiratory normal breathing without difficulty. Cardiovascular 2+ dorsalis pedis/posterior tibialis pulses. no clubbing, cyanosis, significant edema, <3 sec cap refill. Musculoskeletal normal gait and posture. no significant deformity or arthritic changes, no loss or range of motion, no clubbing. Psychiatric this patient is able to make decisions and demonstrates good insight into disease process. Alert and Oriented x 3. pleasant and cooperative. Notes Upon inspection patient's wound bed did require sharp debridement to clear away some of the necrotic debris she tolerated this debridement today without complication and postdebridement the wound bed appears to be doing much better which is great news. Overall I am extremely pleased with where we stand today. Electronic Signature(s) Signed: 10/06/2021 4:38:45 PM By: Worthy Keeler PA-C Entered By: Worthy Keeler on 10/06/2021 16:38:45 [name], [name].  ([id number]) -------------------------------------------------------------------------------- Physician Orders Details Patient Name: [name], [name]. Date of Service: 10/05/2021 12:45 PM Medical Record Number: [id number] Patient Account Number: [id number] Date of Birth/Sex: Nov 29, 1931 (87 y.o. F) Treating RN: [name] Primary Care Provider: Antony Contras Other Clinician: Referring Provider: Antony Contras Treating Provider/Extender: [name] in Treatment: 0 Verbal / Phone Orders: No Diagnosis Coding ICD-10 Coding Code Description L89.153 Pressure ulcer of sacral region, stage 3 I10 Essential (primary) hypertension Z79.01 [place] term (current) use of anticoagulants F01.50 Vascular dementia without behavioral disturbance Follow-up Appointments o Return Appointment in 2 weeks. Bathing/ Shower/ Hygiene o May shower; gently cleanse wound with antibacterial soap, rinse and pat dry prior to dressing wounds Wound Treatment Wound #1 - Sacrum Wound Laterality: Midline Primary Dressing: Prisma 4.34 (in) (DME) (Generic) 3 x Per Week/30 Days Discharge Instructions: Moisten w/normal saline or sterile water; Cover wound as directed. Do not remove from wound bed. Secondary Dressing: Xtrasorb Medium 4x5 (in/in) 3 x Per Week/30 Days Discharge Instructions: Apply to wound as directed. Do not cut. Electronic Signature(s) Signed: 10/05/2021 5:26:28 PM By: Worthy Keeler PA-C Signed: 10/07/2021 4:58:16 PM By: Gretta Cool, BSN, RN, CWS, Kim RN, BSN Entered By: Gretta Cool, BSN, RN, CWS, [name] on 10/05/2021 14:11:03 [name], [name]. ([id number]) -------------------------------------------------------------------------------- Problem List Details Patient Name: Femia, Marcellina I. Date of Service: 10/05/2021 12:45 PM Medical Record Number: [id number] Patient Account Number: [id number] Date of Birth/Sex: [birth year] (87 y.o. F) Treating RN: [name] Primary Care Provider: Antony Contras Other Clinician: Referring  Provider: Antony Contras Treating Provider/Extender: [name] in Treatment: 0 Active Problems ICD-10 Encounter Code Description Active Date MDM Diagnosis L02.31 Cutaneous abscess of buttock 10/05/2021 No Yes L98.412 Non-pressure chronic ulcer of buttock with fat layer exposed 10/05/2021 No Yes I10 Essential (primary) hypertension 10/05/2021 No Yes Z79.01 [place] term (current) use of anticoagulants 10/05/2021 No Yes F01.50 Vascular dementia without behavioral disturbance 10/05/2021 No Yes Inactive Problems Resolved Problems Electronic Signature(s) Signed: 10/05/2021 5:24:52 PM By: Worthy Keeler PA-C Previous Signature: 10/05/2021 1:47:22 PM Version By: Worthy Keeler PA-C Previous Signature: 10/05/2021 1:47:00 PM Version By: Worthy Keeler PA-C Entered By: Worthy Keeler on 10/05/2021 17:24:52 [name], [name]. ([id number]) -------------------------------------------------------------------------------- Progress Note Details Patient Name: Pinder, Naylin I. Date of Service: 10/05/2021 12:45 PM Medical Record Number: [id number] Patient Account Number: [id number] Date of Birth/Sex: 05/10/1932 (  87 y.o. F) Treating RN: [name] Primary Care Provider: Antony Contras Other Clinician: Referring Provider: Antony Contras Treating Provider/Extender: [name] in Treatment: 0 Subjective Chief Complaint Information obtained from Patient Sacral pressure ulcer History of Present Illness (HPI) 10/05/2021 patient presents today for a wound that actually began on around July 06, 2021. This appears initially to have been she tells me a blister that came up and then subsequently open. My concern here is that this was probably more of an abscess than a pressure ulcer she is actually a fairly active individual she does not lay in bed all the time she does ambulate and again I think pressure is a much less likely because of this to be perfectly honest that an abscess. Nonetheless I see no  signs of infection right now she does have some slough buildup on the surface of the wound but nothing too significant which is great news. No fevers, chills, nausea, vomiting, or diarrhea. The patient does have a history otherwise of hypertension, [place]-term use of anticoagulant therapy, and she does have some dementia. Patient History Information obtained from Patient, Caregiver, Chart. Allergies No Known Drug Allergies Social History Former smoker, Marital Status - Widowed, Alcohol Use - Never, Drug Use - No History, Caffeine Use - Daily. Medical History Cardiovascular Patient has history of Hypertension, Peripheral Venous Disease Integumentary (Skin) Denies history of History of pressure wounds Oncologic Patient has history of Received Chemotherapy - Current Review of Systems (ROS) Constitutional Symptoms (General Health) Denies complaints or symptoms of Fatigue, Fever, Chills, Marked Weight Change. Eyes Denies complaints or symptoms of Dry Eyes, Vision Changes, Glasses / Contacts. Ear/Nose/Mouth/Throat Denies complaints or symptoms of Difficult clearing ears, Sinusitis. Hematologic/Lymphatic Denies complaints or symptoms of Bleeding / Clotting Disorders, Human Immunodeficiency Virus. Respiratory Denies complaints or symptoms of Chronic or frequent coughs, Shortness of Breath. Cardiovascular Complains or has symptoms of LE edema. Gastrointestinal Denies complaints or symptoms of Frequent diarrhea, Nausea, Vomiting. Endocrine Denies complaints or symptoms of Hepatitis, Thyroid disease, Polydypsia (Excessive Thirst). Genitourinary Complains or has symptoms of Incontinence/dribbling. Immunological Denies complaints or symptoms of Hives, Itching. Integumentary (Skin) Complains or has symptoms of Wounds, Bleeding or bruising tendency. Musculoskeletal Denies complaints or symptoms of Muscle Pain, Muscle Weakness. Oncologic B-cell Lymphmona [name], [name].  ([id number]) Objective Constitutional patient is hypertensive.. pulse regular and within target range for patient.Marland Kitchen respirations regular, non-labored and within target range for patient.Marland Kitchen temperature within target range for patient.. Well-nourished and well-hydrated in no acute distress. Vitals Time Taken: 1:21 PM, Height: 60 in, Weight: 116 lbs, BMI: 22.7, Temperature: 98.1 F, Pulse: 84 bpm, Respiratory Rate: 16 breaths/min, Blood Pressure: 176/87 mmHg. Eyes conjunctiva clear no eyelid edema noted. pupils equal round and reactive to light and accommodation. Ears, Nose, Mouth, and Throat no gross abnormality of ear auricles or external auditory canals. normal hearing noted during conversation. mucus membranes moist. Respiratory normal breathing without difficulty. Cardiovascular 2+ dorsalis pedis/posterior tibialis pulses. no clubbing, cyanosis, significant edema, Musculoskeletal normal gait and posture. no significant deformity or arthritic changes, no loss or range of motion, no clubbing. Psychiatric this patient is able to make decisions and demonstrates good insight into disease process. Alert and Oriented x 3. pleasant and cooperative. General Notes: Upon inspection patient's wound bed did require sharp debridement to clear away some of the necrotic debris she tolerated this debridement today without complication and postdebridement the wound bed appears to be doing much better which is great news. Overall I am extremely pleased with where we  stand today. Integumentary (Hair, Skin) Wound #1 status is Open. Original cause of wound was Pressure Injury. The date acquired was: 07/06/2021. The wound is located on the Midline Sacrum. The wound measures 3.5cm length x 2cm width x 0.2cm depth; 5.498cm^2 area and 1.1cm^3 volume. There is Fat Layer (Subcutaneous Tissue) exposed. There is no tunneling or undermining noted. There is a medium amount of serous drainage noted. The wound margin is  flat and intact. There is small (1-33%) pink granulation within the wound bed. There is a large (67-100%) amount of necrotic tissue within the wound bed including Adherent Slough. General Notes: Wound edges are macerated. Assessment Active Problems ICD-10 Cutaneous abscess of buttock Non-pressure chronic ulcer of buttock with fat layer exposed Essential (primary) hypertension [place] term (current) use of anticoagulants Vascular dementia without behavioral disturbance Procedures Wound #1 Pre-procedure diagnosis of Wound #1 is an Abscess located on the Midline Sacrum . There was a Excisional Skin/Subcutaneous Tissue Debridement with a total area of 7 sq cm performed by Tommie Sams., PA-C. With the following instrument(s): Curette Material removed includes Subcutaneous Tissue, Slough, and Biofilm after achieving pain control using Lidocaine. No specimens were taken. A time out was conducted at 13:55, prior to the start of the procedure. A Minimum amount of bleeding was controlled with Pressure. The procedure was tolerated well. Post Debridement Measurements: 3.5cm length x 2cm width x 0.3cm depth; 1.649cm^3 volume. Character of Wound/Ulcer Post Debridement is stable. Post procedure Diagnosis Wound #1: Same as Pre-Procedure [name], [name]. ([id number]) Plan Follow-up Appointments: Return Appointment in 2 weeks. Bathing/ Shower/ Hygiene: May shower; gently cleanse wound with antibacterial soap, rinse and pat dry prior to dressing wounds WOUND #1: - Sacrum Wound Laterality: Midline Primary Dressing: Prisma 4.34 (in) (DME) (Generic) 3 x Per Week/30 Days Discharge Instructions: Moisten w/normal saline or sterile water; Cover wound as directed. Do not remove from wound bed. Secondary Dressing: Xtrasorb Medium 4x5 (in/in) 3 x Per Week/30 Days Discharge Instructions: Apply to wound as directed. Do not cut. 1. Would recommend currently the patient does need to have appropriate and aggressive  offloading to make sure nothing is hurting this area. I do think that she should avoid sitting in a hard chair and should try to get her nights cushion pillow for him when she has to. Otherwise she does have a recliner I think this can be good to the big thing is I think she needs to get up and walk around throughout the day at least every hour and again this is something she can definitely do. She is fully ambulatory. 2. I am can recommend that we initiate treatment with a silver collagen dressing which I think would be a good option here. I also think that covering with a border foam dressing will be good. We will see patient back for reevaluation in 2 weeks here in the clinic. If anything worsens or changes patient will contact our office for additional recommendations. Electronic Signature(s) Signed: 10/06/2021 4:39:12 PM By: Worthy Keeler PA-C Entered By: Worthy Keeler on 10/06/2021 16:39:11 Moist, Madalaine I. ([id number]) -------------------------------------------------------------------------------- ROS/PFSH Details Patient Name: [name], [name]. Date of Service: 10/05/2021 12:45 PM Medical Record Number: [id number] Patient Account Number: [id number] Date of Birth/Sex: 05-18-1932 (87 y.o. F) Treating RN: [name] Primary Care Provider: Antony Contras Other Clinician: Referring Provider: Antony Contras Treating Provider/Extender: [name] in Treatment: 0 Information Obtained From Patient Caregiver Chart Constitutional Symptoms (General Health) Complaints and Symptoms: Negative for: Fatigue; Fever; Chills; Marked  Weight Change Eyes Complaints and Symptoms: Negative for: Dry Eyes; Vision Changes; Glasses / Contacts Ear/Nose/Mouth/Throat Complaints and Symptoms: Negative for: Difficult clearing ears; Sinusitis Hematologic/Lymphatic Complaints and Symptoms: Negative for: Bleeding / Clotting Disorders; Human Immunodeficiency Virus Respiratory Complaints and  Symptoms: Negative for: Chronic or frequent coughs; Shortness of Breath Cardiovascular Complaints and Symptoms: Positive for: LE edema Medical History: Positive for: Hypertension; Peripheral Venous Disease Gastrointestinal Complaints and Symptoms: Negative for: Frequent diarrhea; Nausea; Vomiting Endocrine Complaints and Symptoms: Negative for: Hepatitis; Thyroid disease; Polydypsia (Excessive Thirst) Genitourinary Complaints and Symptoms: Positive for: Incontinence/dribbling Immunological Complaints and Symptoms: Negative for: Hives; Itching Integumentary (Skin) [name], [name]. ([id number]) Complaints and Symptoms: Positive for: Wounds; Bleeding or bruising tendency Medical History: Negative for: History of pressure wounds Musculoskeletal Complaints and Symptoms: Negative for: Muscle Pain; Muscle Weakness Oncologic Complaints and Symptoms: Review of System Notes: B-cell Lymphmona Medical History: Positive for: Received Chemotherapy - Current Immunizations Pneumococcal Vaccine: Received Pneumococcal Vaccination: No Implantable Devices None Family and Social History Former smoker; Marital Status - Widowed; Alcohol Use: Never; Drug Use: No History; Caffeine Use: Daily Electronic Signature(s) Signed: 10/05/2021 5:26:28 PM By: Worthy Keeler PA-C Signed: 10/07/2021 4:58:16 PM By: Gretta Cool, BSN, RN, CWS, Kim RN, BSN Entered By: Gretta Cool, BSN, RN, CWS, [name] on 10/05/2021 13:28:02 Granlund, Anina I. ([id number]) -------------------------------------------------------------------------------- SuperBill Details Patient Name: Fewell, Akeylah I. Date of Service: 10/05/2021 Medical Record Number: [id number] Patient Account Number: [id number] Date of Birth/Sex: August 27, 1932 (87 y.o. F) Treating RN: [name] Primary Care Provider: Antony Contras Other Clinician: Referring Provider: Antony Contras Treating Provider/Extender: [name] in Treatment: 0 Diagnosis Coding ICD-10 Codes Code  Description L02.31 Cutaneous abscess of buttock L98.412 Non-pressure chronic ulcer of buttock with fat layer exposed I10 Essential (primary) hypertension Z79.01 [place] term (current) use of anticoagulants F01.50 Vascular dementia without behavioral disturbance Facility Procedures CPT4 Code: 89373428 Description: [place] VISIT-LEV 3 EST PT Modifier: Quantity: 1 CPT4 Code: 76811572 Description: 11042 - DEB SUBQ TISSUE 20 SQ CM/< Modifier: Quantity: 1 CPT4 Code: Description: ICD-10 Diagnosis Description L98.412 Non-pressure chronic ulcer of buttock with fat layer exposed Modifier: Quantity: Physician Procedures CPT4 Code: 6203559 Description: WC PHYS LEVEL 3 o NEW PT Modifier: 25 Quantity: 1 CPT4 Code: Description: ICD-10 Diagnosis Description L02.31 Cutaneous abscess of buttock L98.412 Non-pressure chronic ulcer of buttock with fat layer exposed I10 Essential (primary) hypertension Z79.01 [place]ougher term (current) use of anticoagulants Modifier: Quantity: CPT4 Code: [id number] Description: 11042 - WC PHYS SUBQ TISS 20 SQ CM Modifier: Quantity: 1 CPT4 Code: Description: ICD-10 Diagnosis Description L98.412 Non-pressure chronic ulcer of buttock with fat layer exposed Modifier: Quantity: Electronic Signature(s) Signed: 10/05/2021 5:25:30 PM By: Worthy Keeler PA-C Entered By: Worthy Keeler on 10/05/2021 17:25:30

## 2021-10-07 NOTE — Progress Notes (Signed)
Sangalang, Sherylann I. ([id number]) Visit Report for 10/05/2021 Allergy List Details Patient Name: Canevari, Nichelle I. Date of Service: 10/05/2021 12:45 PM Medical Record Number: [id number] Patient Account Number: [id number] Date of Birth/Sex: [birth year] (87 y.o. F) Treating RN: Cornell Barman Primary Care Raydell Maners: Antony Contras Other Clinician: Referring Joann Kulpa: Antony Contras Treating Sherwood Castilla/Extender: Skipper Cliche in Treatment: 0 Allergies Active Allergies No Known Drug Allergies Allergy Notes Electronic Signature(s) Signed: 10/07/2021 4:58:16 PM By: Gretta Cool, BSN, RN, CWS, Kim RN, BSN Entered By: Gretta Cool, BSN, RN, CWS, Kim on 10/05/2021 13:22:36 Swander, Lunabelle I. ([id number]) -------------------------------------------------------------------------------- Arrival Information Details Patient Name: Schnepp, Adamariz I. Date of Service: 10/05/2021 12:45 PM Medical Record Number: [id number] Patient Account Number: [id number] Date of Birth/Sex: 03/12/1932 (87 y.o. F) Treating RN: Cornell Barman Primary Care Harvey Matlack: Antony Contras Other Clinician: Referring Jleigh Striplin: Antony Contras Treating Likisha Alles/Extender: Skipper Cliche in Treatment: 0 Visit Information Patient Arrived: Ambulatory Arrival Time: 13:19 Accompanied By: niece Transfer Assistance: None Patient Identification Verified: Yes Secondary Verification Process Completed: Yes Patient Has Alerts: Yes Patient Alerts: Patient on Blood Thinner NOT DIABETIC ELIOQUIS Electronic Signature(s) Signed: 10/07/2021 4:58:16 PM By: Gretta Cool, BSN, RN, CWS, Kim RN, BSN Entered By: Gretta Cool, BSN, RN, CWS, Kim on 10/05/2021 13:20:31 Pollitt, Amandalynn I. ([id number]) -------------------------------------------------------------------------------- Clinic Level of Care Assessment Details Patient Name: Kugel, Jeniffer I. Date of Service: 10/05/2021 12:45 PM Medical Record Number: [id number] Patient Account Number: [id number] Date of Birth/Sex: [birth year] (87 y.o. F) Treating RN: Cornell Barman Primary Care Keri Veale: Antony Contras Other Clinician: Referring Oris Staffieri: Antony Contras Treating Iysis Germain/Extender: Skipper Cliche in Treatment: 0 Clinic Level of Care Assessment Items TOOL 1 Quantity Score []  - Use when EandM and Procedure is performed on INITIAL visit 0 ASSESSMENTS - Nursing Assessment / Reassessment X - General Physical Exam (combine w/ comprehensive assessment (listed just below) when performed on new 1 20 pt. evals) X- 1 25 Comprehensive Assessment (HX, ROS, Risk Assessments, Wounds Hx, etc.) ASSESSMENTS - Wound and Skin Assessment / Reassessment []  - Dermatologic / Skin Assessment (not related to wound area) 0 ASSESSMENTS - Ostomy and/or Continence Assessment and Care []  - Incontinence Assessment and Management 0 []  - 0 Ostomy Care Assessment and Management (repouching, etc.) PROCESS - Coordination of Care X - Simple Patient / Family Education for ongoing care 1 15 []  - 0 Complex (extensive) Patient / Family Education for ongoing care X- 1 10 Staff obtains Programmer, systems, Records, Test Results / Process Orders []  - 0 Staff telephones HHA, Nursing Homes / Clarify orders / etc []  - 0 Routine Transfer to another Facility (non-emergent condition) []  - 0 Routine Hospital Admission (non-emergent condition) X- 1 15 New Admissions / Biomedical engineer / Ordering NPWT, Apligraf, etc. []  - 0 Emergency Hospital Admission (emergent condition) PROCESS - Special Needs []  - Pediatric / Minor Patient Management 0 []  - 0 Isolation Patient Management []  - 0 Hearing / Language / Visual special needs []  - 0 Assessment of Community assistance (transportation, D/C planning, etc.) []  - 0 Additional assistance / Altered mentation []  - 0 Support Surface(s) Assessment (bed, cushion, seat, etc.) INTERVENTIONS - Miscellaneous []  - External ear exam 0 []  - 0 Patient Transfer (multiple staff / Civil Service fast streamer / Similar devices) []  - 0 Simple Staple / Suture removal (25  or less) []  - 0 Complex Staple / Suture removal (26 or more) []  - 0 Hypo/Hyperglycemic Management (do not check if billed separately) []  - 0 Ankle / Brachial Index (ABI) - do not check if billed separately Has the  patient been seen at the hospital within the last three years: Yes Total Score: 85 Level Of Care: New/Established - Level 3 Troup, Otto I. ([id number]) Electronic Signature(s) Signed: 10/07/2021 4:58:16 PM By: Gretta Cool, BSN, RN, CWS, Kim RN, BSN Entered By: Gretta Cool, BSN, RN, CWS, Kim on 10/05/2021 14:12:01 Casebier, Alayha I. ([id number]) -------------------------------------------------------------------------------- Encounter Discharge Information Details Patient Name: Fewell, Sherlyn I. Date of Service: 10/05/2021 12:45 PM Medical Record Number: [id number] Patient Account Number: [id number] Date of Birth/Sex: 1932/01/03 (87 y.o. F) Treating RN: Cornell Barman Primary Care Asiana Benninger: Antony Contras Other Clinician: Referring Katrinna Travieso: Antony Contras Treating Zamariah Seaborn/Extender: Skipper Cliche in Treatment: 0 Encounter Discharge Information Items Post Procedure Vitals Discharge Condition: Stable Unable to obtain vitals Reason: limited time Ambulatory Status: Ambulatory Discharge Destination: Home Transportation: Private Auto Accompanied By: niece Schedule Follow-up Appointment: Yes Clinical Summary of Care: Electronic Signature(s) Signed: 10/07/2021 4:58:16 PM By: Gretta Cool, BSN, RN, CWS, Kim RN, BSN Entered By: Gretta Cool, BSN, RN, CWS, Kim on 10/05/2021 14:13:35 Petko, Kelda I. ([id number]) -------------------------------------------------------------------------------- Lower Extremity Assessment Details Patient Name: Bahe, Zaire I. Date of Service: 10/05/2021 12:45 PM Medical Record Number: [id number] Patient Account Number: [id number] Date of Birth/Sex: 07-May-1932 (87 y.o. F) Treating RN: Cornell Barman Primary Care Davion Flannery: Antony Contras Other Clinician: Referring Hanna Aultman: Antony Contras Treating Lois Ostrom/Extender: Skipper Cliche in Treatment: 0 Electronic Signature(s) Signed: 10/07/2021 4:58:16 PM By: Gretta Cool, BSN, RN, CWS, Kim RN, BSN Entered By: Gretta Cool, BSN, RN, CWS, Kim on 10/05/2021 13:30:53 Cajuste, Amanda I. ([id number]) -------------------------------------------------------------------------------- Multi Wound Chart Details Patient Name: Bansal, Summerlynn I. Date of Service: 10/05/2021 12:45 PM Medical Record Number: [id number] Patient Account Number: [id number] Date of Birth/Sex: July 09, 1932 (87 y.o. F) Treating RN: Cornell Barman Primary Care Timarie Labell: Antony Contras Other Clinician: Referring Maycol Hoying: Antony Contras Treating Sohan Potvin/Extender: Skipper Cliche in Treatment: 0 Vital Signs Height(in): 60 Pulse(bpm): 40 Weight(lbs): 116 Blood Pressure(mmHg): 176/87 Body Mass Index(BMI): 23 Temperature(F): 98.1 Respiratory Rate(breaths/min): 16 Photos: [N/A:N/A] Wound Location: Midline Sacrum N/A N/A Wounding Event: Pressure Injury N/A N/A Primary Etiology: Pressure Ulcer N/A N/A Comorbid History: Hypertension, Peripheral Venous N/A N/A Disease, Received Chemotherapy Date Acquired: 07/06/2021 N/A N/A Weeks of Treatment: 0 N/A N/A Wound Status: Open N/A N/A Measurements L x W x D (cm) 3.5x2x0.2 N/A N/A Area (cm) : 5.498 N/A N/A Volume (cm) : 1.1 N/A N/A % Reduction in Area: 0.00% N/A N/A % Reduction in Volume: 0.00% N/A N/A Classification: Category/Stage III N/A N/A Exudate Amount: Medium N/A N/A Exudate Type: Serous N/A N/A Exudate Color: amber N/A N/A Granulation Amount: Small (1-33%) N/A N/A Granulation Quality: Pink N/A N/A Necrotic Amount: Large (67-100%) N/A N/A Exposed Structures: Fat Layer (Subcutaneous Tissue): N/A N/A Yes Fascia: No Tendon: No Muscle: No Joint: No Bone: No Epithelialization: None N/A N/A Assessment Notes: Wound edges are macerated. N/A N/A Treatment Notes Electronic Signature(s) Signed: 10/07/2021 4:58:16 PM By: Gretta Cool,  BSN, RN, CWS, Kim RN, BSN Entered By: Gretta Cool, BSN, RN, CWS, Kim on 10/05/2021 13:56:51 Diluzio, Kaytlyn I. ([id number]) -------------------------------------------------------------------------------- Multi-Disciplinary Care Plan Details Patient Name: Kempfer, Melika I. Date of Service: 10/05/2021 12:45 PM Medical Record Number: [id number] Patient Account Number: [id number] Date of Birth/Sex: 05/06/1932 (87 y.o. F) Treating RN: Cornell Barman Primary Care Garnett Nunziata: Antony Contras Other Clinician: Referring Clarisa Danser: Antony Contras Treating Ponce Skillman/Extender: Skipper Cliche in Treatment: 0 Active Inactive Abuse / Safety / Falls / Self Care Management Nursing Diagnoses: Potential for falls Self care deficit: actual or potential Goals: Patient will remain injury free related to falls Date Initiated: 10/05/2021 Target  Resolution Date: 10/05/2021 Goal Status: Active Interventions: Provide education on safe transfers Notes: Necrotic Tissue Nursing Diagnoses: Impaired tissue integrity related to necrotic/devitalized tissue Knowledge deficit related to management of necrotic/devitalized tissue Goals: Necrotic/devitalized tissue will be minimized in the wound bed Date Initiated: 10/05/2021 Target Resolution Date: 10/02/2021 Goal Status: Active Patient/caregiver will verbalize understanding of reason and process for debridement of necrotic tissue Date Initiated: 10/05/2021 Target Resolution Date: 10/02/2021 Goal Status: Active Interventions: Assess patient pain level pre-, during and post procedure and prior to discharge Provide education on necrotic tissue and debridement process Treatment Activities: Apply topical anesthetic as ordered : 10/05/2021 Excisional debridement : 10/05/2021 Notes: Orientation to the Wound Care Program Nursing Diagnoses: Knowledge deficit related to the wound healing center program Goals: Patient/caregiver will verbalize understanding of the [place]  Program Date Initiated: 10/05/2021 Target Resolution Date: 10/02/2021 Goal Status: Active Interventions: Provide education on orientation to the wound center Notes: Metivier, Virgina I. ([id number]) Pain, Acute or Chronic Nursing Diagnoses: Pain Management - Non-cyclic Acute (Procedural) Goals: Patient will verbalize adequate pain control and receive pain control interventions during procedures as needed Date Initiated: 10/05/2021 Target Resolution Date: 10/02/2021 Goal Status: Active Interventions: Assess comfort goal upon admission Treatment Activities: Administer pain control measures as ordered : 10/05/2021 Notes: Wound/Skin Impairment Nursing Diagnoses: Impaired tissue integrity Knowledge deficit related to ulceration/compromised skin integrity Goals: Ulcer/skin breakdown will have a volume reduction of 30% by week 4 Date Initiated: 10/05/2021 Target Resolution Date: 10/30/2021 Goal Status: Active Ulcer/skin breakdown will have a volume reduction of 50% by week 8 Date Initiated: 10/05/2021 Target Resolution Date: 11/27/2021 Goal Status: Active Ulcer/skin breakdown will have a volume reduction of 80% by week 12 Date Initiated: 10/05/2021 Target Resolution Date: 12/25/2021 Goal Status: Active Ulcer/skin breakdown will heal within 14 weeks Date Initiated: 10/05/2021 Target Resolution Date: 01/08/2022 Goal Status: Active Interventions: Assess patient/caregiver ability to obtain necessary supplies Provide education on ulcer and skin care Treatment Activities: Referred to DME Eriverto Byrnes for dressing supplies : 10/05/2021 Topical wound management initiated : 10/05/2021 Notes: Electronic Signature(s) Signed: 10/07/2021 4:58:16 PM By: Gretta Cool, BSN, RN, CWS, Kim RN, BSN Entered By: Gretta Cool, BSN, RN, CWS, Kim on 10/05/2021 13:56:27 Sciulli, Eloyce I. ([id number]) -------------------------------------------------------------------------------- Pain Assessment Details Patient Name: Tep, Jazlyn  I. Date of Service: 10/05/2021 12:45 PM Medical Record Number: [id number] Patient Account Number: [id number] Date of Birth/Sex: 02-Sep-1932 (87 y.o. F) Treating RN: Cornell Barman Primary Care Delesa Kawa: Antony Contras Other Clinician: Referring Sudeep Scheibel: Antony Contras Treating Linnae Rasool/Extender: Skipper Cliche in Treatment: 0 Active Problems Location of Pain Severity and Description of Pain Patient Has Paino No Site Locations Pain Management and Medication Current Pain Management: Notes Patient denies pain at this time. Electronic Signature(s) Signed: 10/07/2021 4:58:16 PM By: Gretta Cool, BSN, RN, CWS, Kim RN, BSN Entered By: Gretta Cool, BSN, RN, CWS, Kim on 10/05/2021 13:21:01 Trudell, Ziyon I. ([id number]) -------------------------------------------------------------------------------- Patient/Caregiver Education Details Patient Name: Vrooman, Sanaiya I. Date of Service: 10/05/2021 12:45 PM Medical Record Number: [id number] Patient Account Number: [id number] Date of Birth/Gender: 1931-12-24 (87 y.o. F) Treating RN: Cornell Barman Primary Care Physician: Antony Contras Other Clinician: Referring Physician: Antony Contras Treating Physician/Extender: Skipper Cliche in Treatment: 0 Education Assessment Education Provided To: Patient Education Topics Provided Welcome To The [facility]: Handouts: Welcome To [facility] Methods: Demonstration, Explain/Verbal Responses: State content correctly Wound Debridement: Handouts: Wound Debridement Methods: Demonstration, Explain/Verbal Responses: State content correctly Wound/Skin Impairment: Handouts: Caring for Your Ulcer Methods: Demonstration, Explain/Verbal Responses: State content correctly Electronic Signature(s)  Signed: 10/07/2021 4:58:16 PM By: Gretta Cool, BSN, RN, CWS, Kim RN, BSN Entered By: Gretta Cool, BSN, RN, CWS, Kim on 10/05/2021 14:12:45 Bennie, Jehan I.  ([id number]) -------------------------------------------------------------------------------- Wound Assessment Details Patient Name: Condon, Elois I. Date of Service: 10/05/2021 12:45 PM Medical Record Number: [id number] Patient Account Number: [id number] Date of Birth/Sex: 09-12-1932 (87 y.o. F) Treating RN: Cornell Barman Primary Care Jester Klingberg: Antony Contras Other Clinician: Referring Kayl Stogdill: Antony Contras Treating Kieryn Burtis/Extender: Skipper Cliche in Treatment: 0 Wound Status Wound Number: 1 Primary Abscess Etiology: Wound Location: Midline Sacrum Wound Status: Open Wounding Event: Pressure Injury Comorbid Hypertension, Peripheral Venous Disease, Received Date Acquired: 07/06/2021 History: Chemotherapy Weeks Of Treatment: 0 Clustered Wound: No Photos Wound Measurements Length: (cm) 3.5 Width: (cm) 2 Depth: (cm) 0.2 Area: (cm) 5.498 Volume: (cm) 1.1 % Reduction in Area: 0% % Reduction in Volume: 0% Epithelialization: None Tunneling: No Undermining: No Wound Description Classification: Full Thickness Without Exposed Support Structu Wound Margin: Flat and Intact Exudate Amount: Medium Exudate Type: Serous Exudate Color: amber res Foul Odor After Cleansing: No Slough/Fibrino Yes Wound Bed Granulation Amount: Small (1-33%) Exposed Structure Granulation Quality: Pink Fascia Exposed: No Necrotic Amount: Large (67-100%) Fat Layer (Subcutaneous Tissue) Exposed: Yes Necrotic Quality: Adherent Slough Tendon Exposed: No Muscle Exposed: No Joint Exposed: No Bone Exposed: No Assessment Notes Wound edges are macerated. Treatment Notes Wound #1 (Sacrum) Wound Laterality: Midline Cleanser Wamboldt, Zuriel I. ([id number]) Peri-Wound Care Topical Primary Dressing Prisma 4.34 (in) Discharge Instruction: Moisten w/normal saline or sterile water; Cover wound as directed. Do not remove from wound bed. Secondary Dressing Xtrasorb Medium 4x5 (in/in) Discharge Instruction: Apply to  wound as directed. Do not cut. Secured With Compression Wrap Compression Stockings Environmental education officer) Signed: 10/07/2021 4:58:16 PM By: Gretta Cool, BSN, RN, CWS, Kim RN, BSN Previous Signature: 10/05/2021 1:49:17 PM Version By: Worthy Keeler PA-C Entered By: Gretta Cool, BSN, RN, CWS, Kim on 10/05/2021 14:03:15 Campillo, Freddy I. ([id number]) -------------------------------------------------------------------------------- Vitals Details Patient Name: Nottingham, Krisalyn I. Date of Service: 10/05/2021 12:45 PM Medical Record Number: [id number] Patient Account Number: [id number] Date of Birth/Sex: Dec 15, 1931 (87 y.o. F) Treating RN: Cornell Barman Primary Care Anber Mckiver: Antony Contras Other Clinician: Referring Jamas Jaquay: Antony Contras Treating Rosebud Koenen/Extender: Skipper Cliche in Treatment: 0 Vital Signs Time Taken: 13:21 Temperature (F): 98.1 Height (in): 60 Pulse (bpm): 84 Weight (lbs): 116 Respiratory Rate (breaths/min): 16 Body Mass Index (BMI): 22.7 Blood Pressure (mmHg): 176/87 Reference Range: 80 - 120 mg / dl Electronic Signature(s) Signed: 10/07/2021 4:58:16 PM By: Gretta Cool, BSN, RN, CWS, Kim RN, BSN Entered By: Gretta Cool, BSN, RN, CWS, Kim on 10/05/2021 13:22:18

## 2021-10-13 ENCOUNTER — Ambulatory Visit (HOSPITAL_COMMUNITY): Payer: Medicare HMO

## 2021-10-14 ENCOUNTER — Encounter (HOSPITAL_BASED_OUTPATIENT_CLINIC_OR_DEPARTMENT_OTHER): Payer: Medicare HMO | Admitting: Physician Assistant

## 2021-10-19 ENCOUNTER — Encounter: Payer: Medicare HMO | Admitting: Physician Assistant

## 2021-10-19 ENCOUNTER — Other Ambulatory Visit: Payer: Self-pay

## 2021-10-19 DIAGNOSIS — F0150 Vascular dementia without behavioral disturbance: Secondary | ICD-10-CM | POA: Diagnosis not present

## 2021-10-19 DIAGNOSIS — Z7901 Long term (current) use of anticoagulants: Secondary | ICD-10-CM | POA: Diagnosis not present

## 2021-10-19 DIAGNOSIS — I10 Essential (primary) hypertension: Secondary | ICD-10-CM | POA: Diagnosis not present

## 2021-10-19 DIAGNOSIS — L98412 Non-pressure chronic ulcer of buttock with fat layer exposed: Secondary | ICD-10-CM | POA: Diagnosis not present

## 2021-10-19 DIAGNOSIS — L0231 Cutaneous abscess of buttock: Secondary | ICD-10-CM | POA: Diagnosis not present

## 2021-10-19 NOTE — Progress Notes (Addendum)
Shontz, Kelsa I. ([id number]) Visit Report for 10/19/2021 Chief Complaint Document Details Patient Name: [name], [name]. Date of Service: 10/19/2021 3:00 PM Medical Record Number: [id number] Patient Account Number: [id number] Date of Birth/Sex: Apr 13, 1932 (87 y.o. F) Treating RN: Cornell Barman Primary Care Provider: Antony Contras Other Clinician: Referring Provider: Antony Contras Treating Provider/Extender: Skipper Cliche in Treatment: 2 Information Obtained from: Patient Chief Complaint Sacral pressure ulcer Electronic Signature(s) Signed: 10/19/2021 3:32:40 PM By: Worthy Keeler PA-C Entered By: Worthy Keeler on 10/19/2021 15:32:40 [name], [name]. ([id number]) -------------------------------------------------------------------------------- Debridement Details Patient Name: [name], [name]. Date of Service: 10/19/2021 3:00 PM Medical Record Number: [id number] Patient Account Number: [id number] Date of Birth/Sex: [birth year] (87 y.o. F) Treating RN: Cornell Barman Primary Care Provider: Antony Contras Other Clinician: Referring Provider: Antony Contras Treating Provider/Extender: Skipper Cliche in Treatment: 2 Debridement Performed for Wound #1 Midline Sacrum Assessment: Performed By: Physician Tommie Sams., PA-C Debridement Type: Debridement Level of Consciousness (Pre- Awake and Alert procedure): Pre-procedure Verification/Time Out Yes - 15:40 Taken: Total Area Debrided (L x W): 3 (cm) x 2 (cm) = 6 (cm) Tissue and other material Viable, Non-Viable, Slough, Subcutaneous, Slough debrided: Level: Skin/Subcutaneous Tissue Debridement Description: Excisional Instrument: Curette Bleeding: Minimum Hemostasis Achieved: Pressure Response to Treatment: Procedure was tolerated well Level of Consciousness (Post- Awake and Alert procedure): Post Debridement Measurements of Total Wound Length: (cm) 3 Width: (cm) 2 Depth: (cm) 0.2 Volume: (cm) 0.942 Character of Wound/Ulcer Post  Debridement: Stable Post Procedure Diagnosis Same as Pre-procedure Electronic Signature(s) Signed: 10/19/2021 5:14:38 PM By: Worthy Keeler PA-C Signed: 10/20/2021 9:26:19 AM By: Gretta Cool, BSN, RN, CWS, Kim RN, BSN Entered By: Gretta Cool, BSN, RN, CWS, Kim on 10/19/2021 15:43:22 Bojarski, Emelee I. ([id number]) -------------------------------------------------------------------------------- HPI Details Patient Name: [name], [name]. Date of Service: 10/19/2021 3:00 PM Medical Record Number: [id number] Patient Account Number: [id number] Date of Birth/Sex: Jun 11, 1932 (87 y.o. F) Treating RN: Cornell Barman Primary Care Provider: Antony Contras Other Clinician: Referring Provider: Antony Contras Treating Provider/Extender: Skipper Cliche in Treatment: 2 History of Present Illness HPI Description: 10/05/2021 patient presents today for a wound that actually began on around July 06, 2021. This appears initially to have been she tells me a blister that came up and then subsequently open. My concern here is that this was probably more of an abscess than a pressure ulcer she is actually a fairly active individual she does not lay in bed all the time she does ambulate and again I think pressure is a much less likely because of this to be perfectly honest that an abscess. Nonetheless I see no signs of infection right now she does have some slough buildup on the surface of the wound but nothing too significant which is great news. No fevers, chills, nausea, vomiting, or diarrhea. The patient does have a history otherwise of hypertension, [place]-term use of anticoagulant therapy, and she does have some dementia. 10/19/2021 upon evaluation today patient's wound showed some signs of improvement though she continues to have some issues as well with some slough buildup this can require little bit of sharp debridement. She did not have a dressing on today but she had just recently taken a shower. Obviously that is okay but we  do want to try to keep this covered is much as possible to help keep it from getting too wet. She is also has been having unfortunately some issues with urinary symptoms frequent urination being the main thing here. Again I do believe that still  of utmost concern for her to get checked out if she has a UTI that needs to be addressed sooner rather than later. Electronic Signature(s) Signed: 10/19/2021 3:46:11 PM By: Worthy Keeler PA-C Entered By: Worthy Keeler on 10/19/2021 15:46:10 [name], [name]. ([id number]) -------------------------------------------------------------------------------- Physical Exam Details Patient Name: [name], [name]. Date of Service: 10/19/2021 3:00 PM Medical Record Number: [id number] Patient Account Number: [id number] Date of Birth/Sex: [birth year] (87 y.o. F) Treating RN: Cornell Barman Primary Care Provider: Antony Contras Other Clinician: Referring Provider: Antony Contras Treating Provider/Extender: Skipper Cliche in Treatment: 2 Constitutional Well-nourished and well-hydrated in no acute distress. Respiratory normal breathing without difficulty. Psychiatric this patient is able to make decisions and demonstrates good insight into disease process. Alert and Oriented x 3. pleasant and cooperative. Notes Upon inspection patient's wound bed showed signs of good granulation and epithelization for the most part at least better than what we saw even the first time last week. Nonetheless there still is some slough noted sharp debridement was performed to clear away some of the necrotic debris she tolerated that today without complication postdebridement wound bed appears to be doing much better which is great news. Electronic Signature(s) Signed: 10/19/2021 3:46:31 PM By: Worthy Keeler PA-C Entered By: Worthy Keeler on 10/19/2021 15:46:31 [name], [name]. ([id number]) -------------------------------------------------------------------------------- Physician Orders  Details Patient Name: [name], [name]. Date of Service: 10/19/2021 3:00 PM Medical Record Number: [id number] Patient Account Number: [id number] Date of Birth/Sex: 1932-09-28 (87 y.o. F) Treating RN: Cornell Barman Primary Care Provider: Antony Contras Other Clinician: Referring Provider: Antony Contras Treating Provider/Extender: Skipper Cliche in Treatment: 2 Verbal / Phone Orders: No Diagnosis Coding ICD-10 Coding Code Description L02.31 Cutaneous abscess of buttock L98.412 Non-pressure chronic ulcer of buttock with fat layer exposed I10 Essential (primary) hypertension Z79.01 [place] term (current) use of [name] F01.50 Vascular dementia without behavioral disturbance Follow-up Appointments o Return Appointment in 2 weeks. Bathing/ Shower/ Hygiene o May shower; gently cleanse wound with antibacterial soap, rinse and pat dry prior to dressing wounds Wound Treatment Wound #1 - Sacrum Wound Laterality: Midline Primary Dressing: Prisma 4.34 (in) (Generic) 3 x Per Week/30 Days Discharge Instructions: Moisten w/normal saline or sterile water; Cover wound as directed. Do not remove from wound bed. Secondary Dressing: Xtrasorb Medium 4x5 (in/in) 3 x Per Week/30 Days Discharge Instructions: Apply to wound as directed. Do not cut. Electronic Signature(s) Signed: 10/19/2021 5:14:38 PM By: Worthy Keeler PA-C Signed: 10/20/2021 9:26:19 AM By: Gretta Cool, BSN, RN, CWS, Kim RN, BSN Entered By: Gretta Cool, BSN, RN, CWS, Kim on 10/19/2021 15:44:16 Perales, Sonali I. ([id number]) -------------------------------------------------------------------------------- Problem List Details Patient Name: [name], [name]. Date of Service: 10/19/2021 3:00 PM Medical Record Number: [id number] Patient Account Number: [id number] Date of Birth/Sex: 07/30/1932 (87 y.o. F) Treating RN: Cornell Barman Primary Care Provider: Antony Contras Other Clinician: Referring Provider: Antony Contras Treating Provider/Extender: Skipper Cliche in Treatment: 2 Active Problems ICD-10 Encounter Code Description Active Date MDM Diagnosis L02.31 Cutaneous abscess of buttock 10/05/2021 No Yes L98.412 Non-pressure chronic ulcer of buttock with fat layer exposed 10/05/2021 No Yes I10 Essential (primary) hypertension 10/05/2021 No Yes Z79.01 [employer] term (current) use of [name] 10/05/2021 No Yes F01.50 Vascular dementia without behavioral disturbance 10/05/2021 No Yes Inactive Problems Resolved Problems Electronic Signature(s) Signed: 10/19/2021 3:21:28 PM By: Worthy Keeler PA-C Entered By: Worthy Keeler on 10/19/2021 15:21:26 [name], [name] I. ([id number]) -------------------------------------------------------------------------------- Progress Note Details Patient Name: [name], [name]. Date of Service: 10/19/2021 3:00 PM  Medical Record Number: [id number] Patient Account Number: [id number] Date of Birth/Sex: [birth year] (87 y.o. F) Treating RN: Cornell Barman Primary Care Provider: Antony Contras Other Clinician: Referring Provider: Antony Contras Treating Provider/Extender: Skipper Cliche in Treatment: 2 Subjective Chief Complaint Information obtained from Patient Sacral pressure ulcer History of Present Illness (HPI) 10/05/2021 patient presents today for a wound that actually began on around July 06, 2021. This appears initially to have been she tells me a blister that came up and then subsequently open. My concern here is that this was probably more of an abscess than a pressure ulcer she is actually a fairly active individual she does not lay in bed all the time she does ambulate and again I think pressure is a much less likely because of this to be perfectly honest that an abscess. Nonetheless I see no signs of infection right now she does have some slough buildup on the surface of the wound but nothing too significant which is great news. No fevers, chills, nausea, vomiting, or diarrhea. The patient does have  a history otherwise of hypertension, [place]-term use of anticoagulant therapy, and she does have some dementia. 10/19/2021 upon evaluation today patient's wound showed some signs of improvement though she continues to have some issues as well with some slough buildup this can require little bit of sharp debridement. She did not have a dressing on today but she had just recently taken a shower. Obviously that is okay but we do want to try to keep this covered is much as possible to help keep it from getting too wet. She is also has been having unfortunately some issues with urinary symptoms frequent urination being the main thing here. Again I do believe that still of utmost concern for her to get checked out if she has a UTI that needs to be addressed sooner rather than later. Objective Constitutional Well-nourished and well-hydrated in no acute distress. Vitals Time Taken: 3:25 PM, Height: 60 in, Weight: 116 lbs, BMI: 22.7, Temperature: 98.3 F, Pulse: 91 bpm, Respiratory Rate: 16 breaths/min, Blood Pressure: 177/81 mmHg. Respiratory normal breathing without difficulty. Psychiatric this patient is able to make decisions and demonstrates good insight into disease process. Alert and Oriented x 3. pleasant and cooperative. General Notes: Upon inspection patient's wound bed showed signs of good granulation and epithelization for the most part at least better than what we saw even the first time last week. Nonetheless there still is some slough noted sharp debridement was performed to clear away some of the necrotic debris she tolerated that today without complication postdebridement wound bed appears to be doing much better which is great news. Integumentary (Hair, Skin) Wound #1 status is Open. Original cause of wound was Pressure Injury. The date acquired was: 07/06/2021. The wound has been in treatment 2 weeks. The wound is located on the Midline Sacrum. The wound measures 3cm length x 2cm width x  0.2cm depth; 4.712cm^2 area and 0.942cm^3 volume. There is Fat Layer (Subcutaneous Tissue) exposed. There is no tunneling or undermining noted. There is a medium amount of serous drainage noted. The wound margin is flat and intact. There is medium (34-66%) pink granulation within the wound bed. There is a medium (34-66%) amount of necrotic tissue within the wound bed including Adherent Slough. Assessment Active Problems [name], [name]. ([id number]) ICD-10 Cutaneous abscess of buttock Non-pressure chronic ulcer of buttock with fat layer exposed Essential (primary) hypertension [name] Vascular dementia without behavioral disturbance Procedures Wound #1  Pre-procedure diagnosis of Wound #1 is an Abscess located on the Midline Sacrum . There was a Excisional Skin/Subcutaneous Tissue Debridement with a total area of 6 sq cm performed by Tommie Sams., PA-C. With the following instrument(s): Curette to remove Viable and Non-Viable tissue/material. Material removed includes Subcutaneous Tissue and Slough and. No specimens were taken. A time out was conducted at 15:40, prior to the start of the procedure. A Minimum amount of bleeding was controlled with Pressure. The procedure was tolerated well. Post Debridement Measurements: 3cm length x 2cm width x 0.2cm depth; 0.942cm^3 volume. Character of Wound/Ulcer Post Debridement is stable. Post procedure Diagnosis Wound #1: Same as Pre-Procedure Plan Follow-up Appointments: Return Appointment in 2 weeks. Bathing/ Shower/ Hygiene: May shower; gently cleanse wound with antibacterial soap, rinse and pat dry prior to dressing wounds WOUND #1: - Sacrum Wound Laterality: Midline Primary Dressing: Prisma 4.34 (in) (Generic) 3 x Per Week/30 Days Discharge Instructions: Moisten w/normal saline or sterile water; Cover wound as directed. Do not remove from wound bed. Secondary Dressing: Xtrasorb Medium 4x5 (in/in) 3 x Per  Week/30 Days Discharge Instructions: Apply to wound as directed. Do not cut. 1. Would recommend currently that we going to continue with the silver collagen dressing I think that still a good way to go. 2. I am also can recommend that we have the patient continue with the border foam dressing to cover. 3. I would also recommend that the patient continue to offload is much as possible to try to keep pressure off of the sacral region. 4. I also suggest she get her urine checked and sounds like she could have a urinary tract infection I think that is the biggest issue at this point. We will see patient back for reevaluation in 1 week here in the clinic. If anything worsens or changes patient will contact our office for additional recommendations. Electronic Signature(s) Signed: 10/19/2021 3:47:19 PM By: Worthy Keeler PA-C Entered By: Worthy Keeler on 10/19/2021 15:47:19 Beahm, Moncerrath I. ([id number]) -------------------------------------------------------------------------------- SuperBill Details Patient Name: Rieser, Elecia I. Date of Service: 10/19/2021 Medical Record Number: [id number] Patient Account Number: [id number] Date of Birth/Sex: [birth year] (87 y.o. F) Treating RN: Cornell Barman Primary Care Provider: Antony Contras Other Clinician: Referring Provider: Antony Contras Treating Provider/Extender: Skipper Cliche in Treatment: 2 Diagnosis Coding ICD-10 Codes Code Description L02.31 Cutaneous abscess of buttock L98.412 Non-pressure chronic ulcer of buttock with fat layer exposed I10 Essential (primary) hypertension Z79.01 [place] term (current) use of [name] F01.50 Vascular dementia without behavioral disturbance Facility Procedures CPT4 Code: [id number] Description: 11042 - DEB SUBQ TISSUE 20 SQ CM/< Modifier: Quantity: 1 CPT4 Code: Description: ICD-10 Diagnosis Description L98.412 Non-pressure chronic ulcer of buttock with fat layer exposed Modifier: Quantity: Physician  Procedures CPT4 Code: 6333545 Description: 62563 - WC PHYS LEVEL 4 - EST PT Modifier: 25 Quantity: 1 CPT4 Code: Description: ICD-10 Diagnosis Description L02.31 Cutaneous abscess of buttock L98.412 Non-pressure chronic ulcer of buttock with fat layer exposed I10 Essential (primary) hypertension Z79.01 [place] term (current) use of [name] Modifier: Quantity: CPT4 Code: [id number] Description: 11042 - WC PHYS SUBQ TISS 20 SQ CM Modifier: Quantity: 1 CPT4 Code: Description: ICD-10 Diagnosis Description L98.412 Non-pressure chronic ulcer of buttock with fat layer exposed Modifier: Quantity: Electronic Signature(s) Signed: 10/19/2021 3:47:46 PM By: Worthy Keeler PA-C Entered By: Worthy Keeler on 10/19/2021 15:47:45

## 2021-10-20 NOTE — Progress Notes (Signed)
[name], [name]. ([id number]) Visit Report for 10/19/2021 Arrival Information Details Patient Name: [name], [name] Date of Service: 10/19/2021 3:00 PM Medical Record Number: [id number] Patient Account Number: [id number] Date of Birth/Sex: 01-04-1932 (87 y.o. F) Treating RN: Cornell Barman Primary Care Kepler Mccabe: Antony Contras Other Clinician: Referring Talin Feister: Antony Contras Treating Sparkles Mcneely/Extender: Skipper Cliche in Treatment: 2 Visit Information History Since Last Visit Added or deleted any medications: No Patient Arrived: Ambulatory Has Dressing in Place as Prescribed: No Arrival Time: 15:24 Pain Present Now: No Accompanied By: niece Transfer Assistance: None Patient Identification Verified: Yes Secondary Verification Process Completed: Yes Patient Has Alerts: Yes Patient Alerts: Patient on Blood Thinner NOT DIABETIC ELIOQUIS Electronic Signature(s) Signed: 10/20/2021 9:26:19 AM By: Gretta Cool, BSN, RN, CWS, Kim RN, BSN Entered By: Gretta Cool, BSN, RN, CWS, Kim on 10/19/2021 15:24:56 [name], [name]. ([id number]) -------------------------------------------------------------------------------- Encounter Discharge Information Details Patient Name: Shetterly, [name] Date of Service: 10/19/2021 3:00 PM Medical Record Number: [id number] Patient Account Number: [id number] Date of Birth/Sex: 07/26/1932 (87 y.o. F) Treating RN: Cornell Barman Primary Care Rubena Roseman: Antony Contras Other Clinician: Referring Kenecia Barren: Antony Contras Treating Everson Mott/Extender: Skipper Cliche in Treatment: 2 Encounter Discharge Information Items Post Procedure Vitals Discharge Condition: Stable Temperature (F): 98.3 Ambulatory Status: Ambulatory Pulse (bpm): 91 Discharge Destination: Home Respiratory Rate (breaths/min): 16 Transportation: Private Auto Blood Pressure (mmHg): 147/81 Accompanied By: Niece Schedule Follow-up Appointment: Yes Clinical Summary of Care: Electronic Signature(s) Signed: 10/20/2021 9:26:19  AM By: Gretta Cool, BSN, RN, CWS, Kim RN, BSN Entered By: Gretta Cool, BSN, RN, CWS, Kim on 10/19/2021 15:46:22 [name], [name]. ([id number]) -------------------------------------------------------------------------------- Lower Extremity Assessment Details Patient Name: [name], [name] Date of Service: 10/19/2021 3:00 PM Medical Record Number: [id number] Patient Account Number: [id number] Date of Birth/Sex: November 29, 1931 (87 y.o. F) Treating RN: Cornell Barman Primary Care Sahaana Weitman: Antony Contras Other Clinician: Referring Latoi Giraldo: Antony Contras Treating Nela Bascom/Extender: Skipper Cliche in Treatment: 2 Electronic Signature(s) Signed: 10/20/2021 9:26:19 AM By: Gretta Cool, BSN, RN, CWS, Kim RN, BSN Entered By: Gretta Cool, BSN, RN, CWS, Kim on 10/19/2021 15:30:48 [name], [name] ([id number]) -------------------------------------------------------------------------------- Multi Wound Chart Details Patient Name: [name], [name] Date of Service: 10/19/2021 3:00 PM Medical Record Number: [id number] Patient Account Number: [id number] Date of Birth/Sex: 12-26-1931 (87 y.o. F) Treating RN: Cornell Barman Primary Care Nelva Hauk: Antony Contras Other Clinician: Referring Maloni Musleh: Antony Contras Treating Fernado Brigante/Extender: Skipper Cliche in Treatment: 2 Vital Signs Height(in): 60 Pulse(bpm): 16 Weight(lbs): 116 Blood Pressure(mmHg): 177/81 Body Mass Index(BMI): 23 Temperature(F): 98.3 Respiratory Rate(breaths/min): 16 Photos: [N/A:N/A] Wound Location: Midline Sacrum N/A N/A Wounding Event: Pressure Injury N/A N/A Primary Etiology: Abscess N/A N/A Comorbid History: Hypertension, Peripheral Venous N/A N/A Disease, Received Chemotherapy Date Acquired: 07/06/2021 N/A N/A Weeks of Treatment: 2 N/A N/A Wound Status: Open N/A N/A Measurements L x W x D (cm) 3x2x0.2 N/A N/A Area (cm) : 4.712 N/A N/A Volume (cm) : 0.942 N/A N/A % Reduction in Area: 14.30% N/A N/A % Reduction in Volume: 14.40% N/A N/A Classification: Full  Thickness Without Exposed N/A N/A Support Structures Exudate Amount: Medium N/A N/A Exudate Type: Serous N/A N/A Exudate Color: amber N/A N/A Wound Margin: Flat and Intact N/A N/A Granulation Amount: Medium (34-66%) N/A N/A Granulation Quality: Pink N/A N/A Necrotic Amount: Medium (34-66%) N/A N/A Exposed Structures: Fat Layer (Subcutaneous Tissue): N/A N/A Yes Fascia: No Tendon: No Muscle: No Joint: No Bone: No Epithelialization: None N/A N/A Treatment Notes Electronic Signature(s) Signed: 10/20/2021 9:26:19 AM By: Gretta Cool, BSN, RN, CWS, Kim RN, BSN Entered By: Gretta Cool,  BSN, RN, CWS, Kim on 10/19/2021 15:39:43 [name], [name]. ([id number]) -------------------------------------------------------------------------------- Multi-Disciplinary Care Plan Details Patient Name: [name], [name]. Date of Service: 10/19/2021 3:00 PM Medical Record Number: [id number] Patient Account Number: [id number] Date of Birth/Sex: 1932-10-31 (87 y.o. F) Treating RN: Cornell Barman Primary Care Duron Meister: Antony Contras Other Clinician: Referring Lawonda Pretlow: Antony Contras Treating Keiosha Cancro/Extender: Skipper Cliche in Treatment: 2 Active Inactive Abuse / Safety / Falls / Self Care Management Nursing Diagnoses: Potential for falls Self care deficit: actual or potential Goals: Patient will remain injury free related to falls Date Initiated: 10/05/2021 Target Resolution Date: 10/05/2021 Goal Status: Active Interventions: Provide education on safe transfers Notes: Necrotic Tissue Nursing Diagnoses: Impaired tissue integrity related to necrotic/devitalized tissue Knowledge deficit related to management of necrotic/devitalized tissue Goals: Necrotic/devitalized tissue will be minimized in the wound bed Date Initiated: 10/05/2021 Target Resolution Date: 10/02/2021 Goal Status: Active Patient/caregiver will verbalize understanding of reason and process for debridement of necrotic tissue Date Initiated:  10/05/2021 Target Resolution Date: 10/02/2021 Goal Status: Active Interventions: Assess patient pain level pre-, during and post procedure and prior to discharge Provide education on necrotic tissue and debridement process Treatment Activities: Apply topical anesthetic as ordered : 10/05/2021 Excisional debridement : 10/05/2021 Notes: Nutrition Nursing Diagnoses: Potential for alteratiion in Nutrition/Potential for imbalanced nutrition Goals: Patient/caregiver agrees to and verbalizes understanding of need to use nutritional supplements and/or vitamins as prescribed Date Initiated: 10/19/2021 Target Resolution Date: 10/19/2021 Goal Status: Active Interventions: Provide education on nutrition Notes: Francis, Safiyya I. ([id number]) Pain, Acute or Chronic Nursing Diagnoses: Pain Management - Non-cyclic Acute (Procedural) Goals: Patient will verbalize adequate pain control and receive pain control interventions during procedures as needed Date Initiated: 10/05/2021 Target Resolution Date: 10/02/2021 Goal Status: Active Interventions: Assess comfort goal upon admission Treatment Activities: Administer pain control measures as ordered : 10/05/2021 Notes: Wound/Skin Impairment Nursing Diagnoses: Impaired tissue integrity Knowledge deficit related to ulceration/compromised skin integrity Goals: Ulcer/skin breakdown will have a volume reduction of 30% by week 4 Date Initiated: 10/05/2021 Target Resolution Date: 10/30/2021 Goal Status: Active Ulcer/skin breakdown will have a volume reduction of 50% by week 8 Date Initiated: 10/05/2021 Target Resolution Date: 11/27/2021 Goal Status: Active Ulcer/skin breakdown will have a volume reduction of 80% by week 12 Date Initiated: 10/05/2021 Target Resolution Date: 12/25/2021 Goal Status: Active Ulcer/skin breakdown will heal within 14 weeks Date Initiated: 10/05/2021 Target Resolution Date: 01/08/2022 Goal Status:  Active Interventions: Assess patient/caregiver ability to obtain necessary supplies Provide education on ulcer and skin care Treatment Activities: Referred to DME Shaquna Geigle for dressing supplies : 10/05/2021 Topical wound management initiated : 10/05/2021 Notes: Electronic Signature(s) Signed: 10/20/2021 9:26:19 AM By: Gretta Cool, BSN, RN, CWS, Kim RN, BSN Entered By: Gretta Cool, BSN, RN, CWS, Kim on 10/19/2021 15:39:33 [name], [name]. ([id number]) -------------------------------------------------------------------------------- Pain Assessment Details Patient Name: [name], [name]. Date of Service: 10/19/2021 3:00 PM Medical Record Number: [id number] Patient Account Number: [id number] Date of Birth/Sex: 1932/06/12 (87 y.o. F) Treating RN: Cornell Barman Primary Care Skyelyn Scruggs: Antony Contras Other Clinician: Referring Preet Perrier: Antony Contras Treating Emmalise Huard/Extender: Skipper Cliche in Treatment: 2 Active Problems Location of Pain Severity and Description of Pain Patient Has Paino No Site Locations Pain Management and Medication Current Pain Management: Notes Patient denies pain at this time. Electronic Signature(s) Signed: 10/20/2021 9:26:19 AM By: Gretta Cool, BSN, RN, CWS, Kim RN, BSN Entered By: Gretta Cool, BSN, RN, CWS, Kim on 10/19/2021 15:26:12 [name], [name]. ([id number]) -------------------------------------------------------------------------------- Patient/Caregiver Education Details Patient Name: [name], [name]. Date of Service: 10/19/2021 3:00  PM Medical Record Number: [id number] Patient Account Number: [id number] Date of Birth/Gender: [birth year] (87 y.o. F) Treating RN: Cornell Barman Primary Care Physician: Antony Contras Other Clinician: Referring Physician: Antony Contras Treating Physician/Extender: Skipper Cliche in Treatment: 2 Education Assessment Education Provided To: Patient Education Topics Provided Wound Debridement: Handouts: Wound Debridement Methods: Demonstration,  Explain/Verbal Responses: State content correctly Electronic Signature(s) Signed: 10/20/2021 9:26:19 AM By: Gretta Cool, BSN, RN, CWS, Kim RN, BSN Entered By: Gretta Cool, BSN, RN, CWS, Kim on 10/19/2021 15:45:26 [name], [name]. ([id number]) -------------------------------------------------------------------------------- Wound Assessment Details Patient Name: [name], [name]. Date of Service: 10/19/2021 3:00 PM Medical Record Number: [id number] Patient Account Number: [id number] Date of Birth/Sex: [birth year] (87 y.o. F) Treating RN: Cornell Barman Primary Care Jelissa Espiritu: Antony Contras Other Clinician: Referring Oswin Johal: Antony Contras Treating Dakayla Disanti/Extender: Skipper Cliche in Treatment: 2 Wound Status Wound Number: 1 Primary Abscess Etiology: Wound Location: Midline Sacrum Wound Status: Open Wounding Event: Pressure Injury Comorbid Hypertension, Peripheral Venous Disease, Received Date Acquired: 07/06/2021 History: Chemotherapy Weeks Of Treatment: 2 Clustered Wound: No Photos Wound Measurements Length: (cm) 3 Width: (cm) 2 Depth: (cm) 0.2 Area: (cm) 4.712 Volume: (cm) 0.942 % Reduction in Area: 14.3% % Reduction in Volume: 14.4% Epithelialization: None Tunneling: No Undermining: No Wound Description Classification: Full Thickness Without Exposed Support Structu Wound Margin: Flat and Intact Exudate Amount: Medium Exudate Type: Serous Exudate Color: amber res Foul Odor After Cleansing: No Slough/Fibrino Yes Wound Bed Granulation Amount: Medium (34-66%) Exposed Structure Granulation Quality: Pink Fascia Exposed: No Necrotic Amount: Medium (34-66%) Fat Layer (Subcutaneous Tissue) Exposed: Yes Necrotic Quality: Adherent Slough Tendon Exposed: No Muscle Exposed: No Joint Exposed: No Bone Exposed: No Treatment Notes Wound #1 (Sacrum) Wound Laterality: Midline Cleanser Peri-Wound Care Topical [name], [name]. ([id number]) Primary Dressing Prisma 4.34 (in) Discharge Instruction:  Moisten w/normal saline or sterile water; Cover wound as directed. Do not remove from wound bed. Secondary Dressing Xtrasorb Medium 4x5 (in/in) Discharge Instruction: Apply to wound as directed. Do not cut. Secured With Compression Wrap Compression Stockings Environmental education officer) Signed: 10/20/2021 9:26:19 AM By: Gretta Cool, BSN, RN, CWS, Kim RN, BSN Entered By: Gretta Cool, BSN, RN, CWS, Kim on 10/19/2021 15:30:20 [name], [name]. ([id number]) -------------------------------------------------------------------------------- Vitals Details Patient Name: [name], Phuong I. Date of Service: 10/19/2021 3:00 PM Medical Record Number: [id number] Patient Account Number: [id number] Date of Birth/Sex: 08/07/1932 (87 y.o. F) Treating RN: Cornell Barman Primary Care Koltyn Kelsay: Antony Contras Other Clinician: Referring Trissa Molina: Antony Contras Treating Leeland Lovelady/Extender: Skipper Cliche in Treatment: 2 Vital Signs Time Taken: 15:25 Temperature (F): 98.3 Height (in): 60 Pulse (bpm): 91 Weight (lbs): 116 Respiratory Rate (breaths/min): 16 Body Mass Index (BMI): 22.7 Blood Pressure (mmHg): 177/81 Reference Range: 80 - 120 mg / dl Electronic Signature(s) Signed: 10/20/2021 9:26:19 AM By: Gretta Cool, BSN, RN, CWS, Kim RN, BSN Entered By: Gretta Cool, BSN, RN, CWS, Kim on 10/19/2021 15:25:55

## 2021-10-21 ENCOUNTER — Other Ambulatory Visit: Payer: Medicare HMO

## 2021-10-21 ENCOUNTER — Ambulatory Visit: Payer: Medicare HMO | Admitting: Hematology and Oncology

## 2021-10-23 ENCOUNTER — Ambulatory Visit (HOSPITAL_COMMUNITY)
Admission: RE | Admit: 2021-10-23 | Discharge: 2021-10-23 | Disposition: A | Discharge status: Home / Self Care | Payer: Medicare HMO | Source: Ambulatory Visit | Attending: Hematology and Oncology | Admitting: Hematology and Oncology

## 2021-10-23 ENCOUNTER — Other Ambulatory Visit: Payer: Self-pay

## 2021-10-23 ENCOUNTER — Other Ambulatory Visit: Payer: Medicare HMO

## 2021-10-23 DIAGNOSIS — J392 Other diseases of pharynx: Secondary | ICD-10-CM | POA: Diagnosis not present

## 2021-10-23 DIAGNOSIS — K7689 Other specified diseases of liver: Secondary | ICD-10-CM | POA: Diagnosis not present

## 2021-10-23 DIAGNOSIS — E049 Nontoxic goiter, unspecified: Secondary | ICD-10-CM | POA: Diagnosis not present

## 2021-10-23 DIAGNOSIS — C8331 Diffuse large B-cell lymphoma, lymph nodes of head, face, and neck: Secondary | ICD-10-CM | POA: Diagnosis not present

## 2021-10-23 DIAGNOSIS — C8330 Diffuse large B-cell lymphoma, unspecified site: Secondary | ICD-10-CM | POA: Diagnosis not present

## 2021-10-23 LAB — GLUCOSE, CAPILLARY: Glucose-Capillary: 87 mg/dL (ref 70–99)

## 2021-10-23 MED ORDER — FLUDEOXYGLUCOSE F - 18 (FDG) INJECTION
6.0000 | Freq: Once | INTRAVENOUS | Status: AC
  Administered 2021-10-23: 5.6 via INTRAVENOUS

## 2021-11-02 ENCOUNTER — Ambulatory Visit: Payer: Medicare HMO | Admitting: Physician Assistant

## 2021-11-05 ENCOUNTER — Inpatient Hospital Stay (HOSPITAL_BASED_OUTPATIENT_CLINIC_OR_DEPARTMENT_OTHER): Payer: Medicare HMO | Admitting: Hematology and Oncology

## 2021-11-05 ENCOUNTER — Inpatient Hospital Stay: Payer: Medicare HMO | Attending: Hematology and Oncology

## 2021-11-05 ENCOUNTER — Other Ambulatory Visit: Payer: Self-pay

## 2021-11-05 ENCOUNTER — Ambulatory Visit: Payer: Medicare HMO | Admitting: Hematology and Oncology

## 2021-11-05 ENCOUNTER — Encounter: Payer: Self-pay | Admitting: Hematology and Oncology

## 2021-11-05 VITALS — BP 156/79 | HR 79 | Temp 97.0°F | Resp 18 | Wt 117.6 lb

## 2021-11-05 DIAGNOSIS — M7989 Other specified soft tissue disorders: Secondary | ICD-10-CM | POA: Diagnosis not present

## 2021-11-05 DIAGNOSIS — D649 Anemia, unspecified: Secondary | ICD-10-CM

## 2021-11-05 DIAGNOSIS — C8331 Diffuse large B-cell lymphoma, lymph nodes of head, face, and neck: Secondary | ICD-10-CM | POA: Insufficient documentation

## 2021-11-05 DIAGNOSIS — C8330 Diffuse large B-cell lymphoma, unspecified site: Secondary | ICD-10-CM

## 2021-11-05 DIAGNOSIS — I10 Essential (primary) hypertension: Secondary | ICD-10-CM | POA: Insufficient documentation

## 2021-11-05 LAB — CBC WITH DIFFERENTIAL (CANCER CENTER ONLY)
Abs Immature Granulocytes: 0.01 10*3/uL (ref 0.00–0.07)
Basophils Absolute: 0 10*3/uL (ref 0.0–0.1)
Basophils Relative: 0 %
Eosinophils Absolute: 0.1 10*3/uL (ref 0.0–0.5)
Eosinophils Relative: 2 %
HCT: 32.2 % — ABNORMAL LOW (ref 36.0–46.0)
Hemoglobin: 10.2 g/dL — ABNORMAL LOW (ref 12.0–15.0)
Immature Granulocytes: 0 %
Lymphocytes Relative: 31 %
Lymphs Abs: 1.6 10*3/uL (ref 0.7–4.0)
MCH: 23 pg — ABNORMAL LOW (ref 26.0–34.0)
MCHC: 31.7 g/dL (ref 30.0–36.0)
MCV: 72.5 fL — ABNORMAL LOW (ref 80.0–100.0)
Monocytes Absolute: 0.4 10*3/uL (ref 0.1–1.0)
Monocytes Relative: 8 %
Neutro Abs: 3 10*3/uL (ref 1.7–7.7)
Neutrophils Relative %: 59 %
Platelet Count: 188 10*3/uL (ref 150–400)
RBC: 4.44 MIL/uL (ref 3.87–5.11)
RDW: 15.8 % — ABNORMAL HIGH (ref 11.5–15.5)
WBC Count: 5.1 10*3/uL (ref 4.0–10.5)
nRBC: 0 % (ref 0.0–0.2)

## 2021-11-05 LAB — CMP (CANCER CENTER ONLY)
ALT: 24 U/L (ref 0–44)
AST: 22 U/L (ref 15–41)
Albumin: 3.7 g/dL (ref 3.5–5.0)
Alkaline Phosphatase: 169 U/L — ABNORMAL HIGH (ref 38–126)
Anion gap: 10 (ref 5–15)
BUN: 19 mg/dL (ref 8–23)
CO2: 26 mmol/L (ref 22–32)
Calcium: 8.7 mg/dL — ABNORMAL LOW (ref 8.9–10.3)
Chloride: 108 mmol/L (ref 98–111)
Creatinine: 0.65 mg/dL (ref 0.44–1.00)
GFR, Estimated: >60 mL/min (ref >60)
Glucose, Bld: 73 mg/dL (ref 70–99)
Potassium: 3.4 mmol/L — ABNORMAL LOW (ref 3.5–5.1)
Sodium: 144 mmol/L (ref 135–145)
Total Bilirubin: 0.3 mg/dL (ref 0.3–1.2)
Total Protein: 6.5 g/dL (ref 6.5–8.1)

## 2021-11-05 LAB — T4, FREE: Free T4: 0.86 ng/dL (ref 0.61–1.12)

## 2021-11-05 NOTE — Progress Notes (Signed)
[place] CONSULT NOTE  Patient Care Team: Antony Contras, MD as PCP - General (Family Medicine)  CHIEF COMPLAINTS/PURPOSE OF CONSULTATION:  DLBCL of nasopharynx.  ASSESSMENT & PLAN:   GCB subtype, DLBCL of nasopharynx, Stage IV  Diffuse large B-cell lymphoma (HCC) This is a very pleasant 87 year old female patient with diffuse large B-cell lymphoma of the nasopharynx with a metastatic disease noticed on imaging who is here for a follow-up after completing 6 cycles of mini RCHOP.  She had excellent response in all metastatic sites however appears to have residual disease at the nasopharynx on most recent PET/CT. We have discussed that this is residual disease and we will have to continue treatment.  However given her age, mentation and quality of life, I have recommended considering palliative radiation to the nasopharynx followed by repeat imaging and surveillance.  If she is not a candidate for radiation, we can consider modified dose of Gemzar with rituximab versus Bendamustine and rituximab.  Overall they understand the aggressiveness of the disease.  Patient herself I do not believe has any insight into this.  I have encouraged her son to talk to his family members as well.  He expressed understanding.   Normocytic normochromic anemia This has continued to improve since we initiated chemotherapy.  There is no indication for transfusion.  We will continue to monitor this.  Labs today show normal white blood cell count, hemoglobin of 10.2 and normal platelet count.  Bilateral lower extremity edema, this is likely unrelated to her disease.  Encouraged to raise her legs and wear compression socks as tolerated.  She does have a DVT for which she is on anticoagulation but I do not believe her DVT has led to significant bilateral lower extremity swelling.  Return to clinic in 2 weeks. No orders of the defined types were placed in this encounter.   HISTORY OF PRESENTING  ILLNESS:   [name] 87 y.o. female is here because of new diagnosis of DLBCL of nasopharynx.  [name] is a very pleasant 87 year old female patient who arrived today with her granddaughter [name] to the appointment.  She does not quite remember things as explained and has short-term memory loss.  After a bit of the conversation, she did admit to me that she was told about a cancer in her nose which was biopsied last week but she cannot remember the details.  Ms. [name] mentioned that for the past year or so [name] has been having more nasal drainage and has progressively lost weight in the past couple months of at least 20 pounds, has become relatively more sedentary and inactive, has no appetite.  They initially thought she might be having a stroke when she was not getting out of bed for 3days and has not been eating.  When she was in the ER.  She had some imaging CT head without contrast which showed left maxillary and ethmoid sinus disease. She then had MRI brain on the same day which showed 3.2 x 2 x 3.5 cm heterogeneous mass positioned at the left nasopharynx near the fossa of Rosenmuller.  Finding is most concerning for a primary nasopharyngeal neoplasm.  ENT consultation was recommended.  She then went on to see [name].She had nasal endoscopy with biopsy of left nasopharyngeal mass.  According to [name], polypoid mass lesion was noted to be lymphoma from the posterior aspect of the left middle turbinate.  Pathology showed findings consistent with diffuse large B-cell lymphoma of the  left nasopharyngeal mass as well as left middle turbinate.   PET/CT showed hypermetabolic left nasopharyngeal mass, no associated neck adenopathy, 2 large hypermetabolic liver lesions, destructive lytic metastatic bone lesions.  Since there was no lymphadenopathy and no unusual appearance of spleen, second biopsy was recommended to confirm the diagnosis.  Left ilium  Biopsy showed findings  consistent with involvement by non-Hodgkin B-cell lymphoma.  Features are similar to previously known large B-cell lymphoma consistent with involvement by the same disease process.  Cycle day 1 of R mini CHOP on 06/03/2021. C2D1 06/23/2021 Cycle 3-day 1 completed on July 15, 2021 She had an interim PET/CT which showed remarkable response. C4D1 on 08/05/2021 C5D1 on 08/26/2021 C6D1 09/18/2021 PET/CT 10/23/2021 showed persistent hypermetabolic thickening in the posterior left nasopharynx, otherwise interval decrease in size of hepatic lesions without metabolic activity, no metabolic activity in the lytic skeletal lesions.  Whitner segment of mild metabolic activity in the esophagus suggest benign esophagitis.  Interval History  On exam today [name] is accompanied by her son.  She is here to review the PET/CT after completing 6 cycles of R mini CHOP.  She does not quite understand the nature of the disease over the course of the disease.  She however has gotten stronger physically, according to family she is more mobile, has more energy has been eating better and no falls.  However when you converse with her, she is so focused on getting herself a new pair of reading glasses because she is so interested in reading.  She does not quite understand the consequences of these PET/CT findings.  She denies any new complaints for me today except for ongoing leg swelling.  No residual side effects from chemotherapy.  She has urinary incontinence which is longstanding and has a follow-up with urology.  Rest of the pertinent 10 point ROS reviewed and negative.  MEDICAL HISTORY:  Past Medical History:  Diagnosis Date   Anemia    Arthritis    knees    GERD (gastroesophageal reflux disease)    Hiatal hernia    History of inguinal hernia repair    BIH   Hyperlipidemia    Hypertension    Memory problem     SURGICAL HISTORY: Past Surgical History:  Procedure Laterality Date   COLONOSCOPY     HERNIA REPAIR   10/13/11   lap BIH rep w/ mesh- Dr. Tora Kindred HERNIA REPAIR  10/13/2011   Procedure: LAPAROSCOPIC INGUINAL HERNIA;  Surgeon: Adin Hector, MD;  Location: [facility];  Service: General;  Laterality: Bilateral;  Laparoscopic bilateral inguinal hernia repair, converted to open bilateral inguinal hernia repairs with mesh   IR IMAGING GUIDED PORT INSERTION  05/06/2021   NASAL ENDOSCOPY Left 04/08/2021   Procedure: NASAL ENDOSCOPY WITH BIOPSY OF LEFT NASOPPHARYNGEAL MASS;  Surgeon: Jason Coop, DO;  Location: [facility];  Service: ENT;  Laterality: Left;  Frozen Section    SOCIAL HISTORY: Social History   Socioeconomic History   Marital status: Single    Spouse name: Not on file   Number of children: Not on file   Years of education: Not on file   Highest education level: Not on file  Occupational History   Not on file  Tobacco Use   Smoking status: Never   Smokeless tobacco: Never  Vaping Use   Vaping Use: Never used  Substance and Sexual Activity   Alcohol use: No   Drug use: No   Sexual activity: Not on  file  Other Topics Concern   Not on file  Social History Narrative   Not on file   Social Determinants of Health   Financial Resource Strain: Not on file  Food Insecurity: Not on file  Transportation Needs: Not on file  Physical Activity: Not on file  Stress: Not on file  Social Connections: Not on file  Intimate Partner Violence: Not on file    FAMILY HISTORY: Family History  Problem Relation Age of Onset   Heart disease Mother     ALLERGIES:  has No Known Allergies.  MEDICATIONS:  Current Outpatient Medications  Medication Sig Dispense Refill   acetaminophen (TYLENOL) 500 MG tablet Take 1,000 mg by mouth every 6 (six) hours as needed for mild pain or fever.     allopurinol (ZYLOPRIM) 300 MG tablet Take 1 tablet (300 mg total) by mouth daily. 30 tablet 0   amLODipine (NORVASC) 10 MG tablet Take 10 mg by mouth daily.     apixaban (ELIQUIS) 5 MG TABS  tablet Take 1 tablet (5 mg total) by mouth 2 (two) times daily. 60 tablet 2   atorvastatin (LIPITOR) 80 MG tablet Take 80 mg by mouth every morning.     ezetimibe (ZETIA) 10 MG tablet Take 10 mg by mouth every morning.     HYDROcodone-acetaminophen (NORCO/VICODIN) 5-325 MG tablet Take 1 tablet by mouth every 4 (four) hours as needed. 15 tablet 0   lidocaine-prilocaine (EMLA) cream Apply to affected area once 30 g 3   LORazepam (ATIVAN) 0.5 MG tablet Take 1 tablet (0.5 mg total) by mouth every 6 (six) hours as needed (Nausea or vomiting). 30 tablet 0   memantine (NAMENDA) 10 MG tablet Take 1/2 tablet (5 mg) for 1 week at bedtime, then 1 tablet (10 mg) at bedtime. 27 tablet 0   memantine (NAMENDA) 10 MG tablet Take 1 tablet (10 mg total) by mouth at bedtime. 90 tablet 1   ondansetron (ZOFRAN) 8 MG tablet Take 1 tablet (8 mg total) by mouth 2 (two) times daily as needed for refractory nausea / vomiting. Start on day 3 after cyclophosphamide chemotherapy. 30 tablet 1   predniSONE (DELTASONE) 20 MG tablet Take 3 tablets (60 mg total) by mouth daily. Take with food on days 1-5 of chemotherapy. 15 tablet 0   prochlorperazine (COMPAZINE) 10 MG tablet Take 1 tablet (10 mg total) by mouth every 6 (six) hours as needed (Nausea or vomiting). 30 tablet 6   No current facility-administered medications for this visit.    PHYSICAL EXAMINATION:  ECOG PERFORMANCE STATUS: 1/2  Physical Exam Constitutional:      Appearance: Normal appearance.  HENT:     Head: Normocephalic and atraumatic.  Eyes:     Pupils: Pupils are equal, round, and reactive to light.  Cardiovascular:     Rate and Rhythm: Normal rate and regular rhythm.     Pulses: Normal pulses.     Heart sounds: Normal heart sounds.  Pulmonary:     Effort: Pulmonary effort is normal.     Breath sounds: Normal breath sounds.  Abdominal:     General: Abdomen is flat. Bowel sounds are normal.     Palpations: Abdomen is soft.  Musculoskeletal:         General: Swelling (Bilateral lower extremity swelling stable since last visit) present. No tenderness.     Cervical back: Normal range of motion and neck supple. No rigidity.  Lymphadenopathy:     Cervical: No cervical adenopathy.  Skin:  General: Skin is warm and dry.  Neurological:     General: No focal deficit present.     Mental Status: She is alert.  Psychiatric:        Mood and Affect: Mood normal.     LABORATORY DATA:  I have reviewed the data as listed Lab Results  Component Value Date   WBC 5.1 11/05/2021   HGB 10.2 (L) 11/05/2021   HCT 32.2 (L) 11/05/2021   MCV 72.5 (L) 11/05/2021   PLT 188 11/05/2021     Chemistry      Component Value Date/Time   NA 144 11/05/2021 1101   K 3.4 (L) 11/05/2021 1101   CL 108 11/05/2021 1101   CO2 26 11/05/2021 1101   BUN 19 11/05/2021 1101   CREATININE 0.65 11/05/2021 1101      Component Value Date/Time   CALCIUM 8.7 (L) 11/05/2021 1101   ALKPHOS 169 (H) 11/05/2021 1101   AST 22 11/05/2021 1101   ALT 24 11/05/2021 1101   BILITOT 0.3 11/05/2021 1101      RADIOGRAPHIC STUDIES: NM PET Image Restag (PS) Skull Base To Thigh  Result Date: 10/24/2021 CLINICAL DATA:  Subsequent treatment strategy for large B-cell lymphoma. EXAM: NUCLEAR MEDICINE PET SKULL BASE TO THIGH TECHNIQUE: 5.6 mCi F-18 FDG was injected intravenously. Full-ring PET imaging was performed from the skull base to thigh after the radiotracer. CT data was obtained and used for attenuation correction and anatomic localization. Fasting blood glucose: 87 mg/dl COMPARISON:  PET-CT 07/30/2021 FINDINGS: Mediastinal blood pool activity: SUV max 1.6 Liver activity: SUV max 2.6 NECK: LEFT posterior nasopharyngeal mass is similar in metabolic activity SUV max equal 6.2 compared SUV max equal 4.9. Soft tissue thickening on the CT portion exam is similar (image 15/CT series 5). Incidental CT findings: Again demonstrated enlargement of the thyroid gland without radiotracer  activity. CHEST: No hypermetabolic mediastinal or supraclavicular nodes. No hypermetabolic axillary nodes. The Heuerman segment metabolic activity involving the esophagus from above the level of the carina to the GE junction. No focal lesion. Incidental CT findings: No suspicious pulmonary nodules. ABDOMEN/PELVIS: Several low-density lesions in the liver without radiotracer activity. Lesion RIGHT hepatic lobe measuring 21 mm (image 84/series 5) compared to 23 mm remeasured. Second RIGHT hepatic lobe lesion measuring 17 mm (image 88) compares to 22 mm. No hypermetabolic abdominal or pelvic lymph nodes. Spleen is normal volume and normal metabolic activity. Incidental CT findings: No calcified leiomyoma within the uterus. SKELETON: No focal metabolic activity within the skeleton. Previous seen sites of metastasis do not have metabolic activity. For example lytic lesion in the posterior RIGHT iliac bone measuring 2.3 cm without radiotracer active. Lytic lesion in the LEFT sacral ala measuring 12 mm (image 125/series 5) without metabolic activity. Example lytic lesion in the LEFT aspect of manubrium measuring 19 mm without metabolic activity. Incidental CT findings: none IMPRESSION: 1. Persistent hypermetabolic thickening in the posterior LEFT nasopharynx. ( [place] 4). 2. Interval decrease in size of hepatic lesions without metabolic activity ( [place] 1.) 3. No metabolic activity associated with lytic skeletal lesions. [place] 1. 4. Burk[place] segment of mild metabolic activity in this esophagus suggest benign esophagitis. Electronically Signed   By: Suzy Bouchard M.D.   On: 10/24/2021 10:35    All questions were answered. The patient knows to call the clinic with any problems, questions or concerns.  I spent 30 minutes in the care of this patient including reviewing plan of care, labs, discussion about follow-up and treatment plan. I  have reviewed her most recent imaging, compared to imaging from June, reviewed  images with the patient and her son.  Have discussed options for treatment second line since she is not a candidate for aggressive treatments.  We have discussed about considering radiation to nasopharynx followed by surveillance versus considering modified dose of Gemzar with rituximab or Bendamustine with rituximab.  Benay Pike MD

## 2021-11-10 ENCOUNTER — Encounter: Payer: Medicare HMO | Attending: Physician Assistant | Admitting: Physician Assistant

## 2021-11-10 ENCOUNTER — Other Ambulatory Visit: Payer: Self-pay

## 2021-11-10 DIAGNOSIS — L0231 Cutaneous abscess of buttock: Secondary | ICD-10-CM | POA: Diagnosis not present

## 2021-11-10 DIAGNOSIS — L98412 Non-pressure chronic ulcer of buttock with fat layer exposed: Secondary | ICD-10-CM | POA: Insufficient documentation

## 2021-11-10 NOTE — Progress Notes (Addendum)
Symanski, Danity I. ([id number]) Visit Report for 11/10/2021 Chief Complaint Document Details Patient Name: [name], [name]. Date of Service: 11/10/2021 2:00 PM Medical Record Number: [id number] Patient Account Number: [id number] Date of Birth/Sex: 06/09/1932 (87 y.o. F) Treating RN: Carlene Coria Primary Care Provider: Antony Contras Other Clinician: Referring Provider: Antony Contras Treating Provider/Extender: Skipper Cliche in Treatment: 5 Information Obtained from: Patient Chief Complaint Sacral pressure ulcer Electronic Signature(s) Signed: 11/10/2021 2:24:35 PM By: Worthy Keeler PA-C Entered By: Worthy Keeler on 11/10/2021 14:24:35 [name], [name]. ([id number]) -------------------------------------------------------------------------------- Debridement Details Patient Name: [name], [name] Date of Service: 11/10/2021 2:00 PM Medical Record Number: [id number] Patient Account Number: [id number] Date of Birth/Sex: [birth year] (87 y.o. F) Treating RN: Carlene Coria Primary Care Provider: Antony Contras Other Clinician: Referring Provider: Antony Contras Treating Provider/Extender: Skipper Cliche in Treatment: 5 Debridement Performed for Wound #1 Midline Sacrum Assessment: Performed By: Physician Tommie Sams., PA-C Debridement Type: Debridement Level of Consciousness (Pre- Awake and Alert procedure): Pre-procedure Verification/Time Out Yes - 14:30 Taken: Start Time: 14:30 Pain Control: Lidocaine 4% Topical Solution Total Area Debrided (L x W): 2.5 (cm) x 2 (cm) = 5 (cm) Tissue and other material Viable, Non-Viable, Slough, Subcutaneous, Slough debrided: Level: Skin/Subcutaneous Tissue Debridement Description: Excisional Instrument: Curette Bleeding: Minimum Hemostasis Achieved: Pressure End Time: 14:33 Procedural Pain: 0 Post Procedural Pain: 0 Response to Treatment: Procedure was tolerated well Level of Consciousness (Post- Awake and Alert procedure): Post Debridement  Measurements of Total Wound Length: (cm) 2.5 Width: (cm) 2 Depth: (cm) 0.2 Volume: (cm) 0.785 Character of Wound/Ulcer Post Debridement: Improved Post Procedure Diagnosis Same as Pre-procedure Electronic Signature(s) Signed: 11/10/2021 4:40:54 PM By: Worthy Keeler PA-C Signed: 11/11/2021 10:49:38 AM By: Carlene Coria RN Entered By: Carlene Coria on 11/10/2021 14:34:53 [name], [name] ([id number]) -------------------------------------------------------------------------------- HPI Details Patient Name: [name], [name] Date of Service: 11/10/2021 2:00 PM Medical Record Number: [id number] Patient Account Number: [id number] Date of Birth/Sex: August 27, 1932 (87 y.o. F) Treating RN: Carlene Coria Primary Care Provider: Antony Contras Other Clinician: Referring Provider: Antony Contras Treating Provider/Extender: Skipper Cliche in Treatment: 5 History of Present Illness HPI Description: 10/05/2021 patient presents today for a wound that actually began on around July 06, 2021. This appears initially to have been she tells me a blister that came up and then subsequently open. My concern here is that this was probably more of an abscess than a pressure ulcer she is actually a fairly active individual she does not lay in bed all the time she does ambulate and again I think pressure is a much less likely because of this to be perfectly honest that an abscess. Nonetheless I see no signs of infection right now she does have some slough buildup on the surface of the wound but nothing too significant which is great news. No fevers, chills, nausea, vomiting, or diarrhea. The patient does have a history otherwise of hypertension, [place]-term use of anticoagulant therapy, and she does have some dementia. 10/19/2021 upon evaluation today patient's wound showed some signs of improvement though she continues to have some issues as well with some slough buildup this can require little bit of sharp debridement. She  did not have a dressing on today but she had just recently taken a shower. Obviously that is okay but we do want to try to keep this covered is much as possible to help keep it from getting too wet. She is also has been having unfortunately some issues with urinary symptoms frequent  urination being the main thing here. Again I do believe that still of utmost concern for her to get checked out if she has a UTI that needs to be addressed sooner rather than later. 11/10/2021 upon evaluation today patient appears to be doing well with regard to her wound from the standpoint of there being no infection unfortunately she does have a lot of moisture and this really is not being changed as far as the dressing is concerned. We have been using collagen but nobody is really been changing the dressings regularly as directed. Nonetheless her niece who is with her today says that she is can work on getting that done appropriately. Electronic Signature(s) Signed: 11/10/2021 2:35:37 PM By: Worthy Keeler PA-C Entered By: Worthy Keeler on 11/10/2021 14:35:36 [name], [name]. ([id number]) -------------------------------------------------------------------------------- Physical Exam Details Patient Name: [name], [name]. Date of Service: 11/10/2021 2:00 PM Medical Record Number: [id number] Patient Account Number: [id number] Date of Birth/Sex: 06-27-1932 (87 y.o. F) Treating RN: Carlene Coria Primary Care Provider: Antony Contras Other Clinician: Referring Provider: Antony Contras Treating Provider/Extender: Skipper Cliche in Treatment: 5 Constitutional Well-nourished and well-hydrated in no acute distress. Respiratory normal breathing without difficulty. Psychiatric this patient is able to make decisions and demonstrates good insight into disease process. Alert and Oriented x 3. pleasant and cooperative. Notes Upon inspection patient's wound bed actually showed signs of good granulation and epithelization at  this point. Fortunately there does not appear to be any signs of active infection locally nor systemically at this time which is great news and overall I am extremely pleased with where we stand today. Electronic Signature(s) Signed: 11/10/2021 2:35:53 PM By: Worthy Keeler PA-C Entered By: Worthy Keeler on 11/10/2021 14:35:52 [name], [name]. ([id number]) -------------------------------------------------------------------------------- Physician Orders Details Patient Name: [name], [name]. Date of Service: 11/10/2021 2:00 PM Medical Record Number: [id number] Patient Account Number: [id number] Date of Birth/Sex: July 13, 1932 (87 y.o. F) Treating RN: Carlene Coria Primary Care Provider: Antony Contras Other Clinician: Referring Provider: Antony Contras Treating Provider/Extender: Skipper Cliche in Treatment: 5 Verbal / Phone Orders: No Diagnosis Coding ICD-10 Coding Code Description L02.31 Cutaneous abscess of buttock L98.412 Non-pressure chronic ulcer of buttock with fat layer exposed I10 Essential (primary) hypertension Z79.01 [place] term (current) use of anticoagulants F01.50 Vascular dementia without behavioral disturbance Follow-up Appointments o Return Appointment in 2 weeks. Bathing/ Shower/ Hygiene o May shower; gently cleanse wound with antibacterial soap, rinse and pat dry prior to dressing wounds Wound Treatment Wound #1 - Sacrum Wound Laterality: Midline Primary Dressing: Prisma 4.34 (in) (Generic) 3 x Per Week/30 Days Discharge Instructions: Moisten w/normal saline or sterile water; Cover wound as directed. Do not remove from wound bed. Secondary Dressing: Zetuvit Plus Silicone Border Dressing 4x4 (in/in) (DME) (Generic) 3 x Per Week/30 Days Electronic Signature(s) Signed: 11/10/2021 4:40:54 PM By: Worthy Keeler PA-C Signed: 11/11/2021 10:49:38 AM By: Carlene Coria RN Entered By: Carlene Coria on 11/10/2021 14:36:46 [name], [name].  ([id number]) -------------------------------------------------------------------------------- Problem List Details Patient Name: [name], [name]. Date of Service: 11/10/2021 2:00 PM Medical Record Number: [id number] Patient Account Number: [id number] Date of Birth/Sex: 12/31/1931 (87 y.o. F) Treating RN: Carlene Coria Primary Care Provider: Antony Contras Other Clinician: Referring Provider: Antony Contras Treating Provider/Extender: Skipper Cliche in Treatment: 5 Active Problems ICD-10 Encounter Code Description Active Date MDM Diagnosis L02.31 Cutaneous abscess of buttock 10/05/2021 No Yes L98.412 Non-pressure chronic ulcer of buttock with fat layer exposed 10/05/2021 No Yes I10 Essential (primary) hypertension 10/05/2021 No  Yes Z79.01 [place] term (current) use of anticoagulants 10/05/2021 No Yes F01.50 Vascular dementia without behavioral disturbance 10/05/2021 No Yes Inactive Problems Resolved Problems Electronic Signature(s) Signed: 11/10/2021 2:24:28 PM By: Worthy Keeler PA-C Entered By: Worthy Keeler on 11/10/2021 14:24:27 Amparan, Verdella I. ([id number]) -------------------------------------------------------------------------------- Progress Note Details Patient Name: [name], [name]. Date of Service: 11/10/2021 2:00 PM Medical Record Number: [id number] Patient Account Number: [id number] Date of Birth/Sex: Sep 23, 1932 (87 y.o. F) Treating RN: Carlene Coria Primary Care Provider: Antony Contras Other Clinician: Referring Provider: Antony Contras Treating Provider/Extender: Skipper Cliche in Treatment: 5 Subjective Chief Complaint Information obtained from Patient Sacral pressure ulcer History of Present Illness (HPI) 10/05/2021 patient presents today for a wound that actually began on around July 06, 2021. This appears initially to have been she tells me a blister that came up and then subsequently open. My concern here is that this was probably more of an abscess than a  pressure ulcer she is actually a fairly active individual she does not lay in bed all the time she does ambulate and again I think pressure is a much less likely because of this to be perfectly honest that an abscess. Nonetheless I see no signs of infection right now she does have some slough buildup on the surface of the wound but nothing too significant which is great news. No fevers, chills, nausea, vomiting, or diarrhea. The patient does have a history otherwise of hypertension, [place]-term use of anticoagulant therapy, and she does have some dementia. 10/19/2021 upon evaluation today patient's wound showed some signs of improvement though she continues to have some issues as well with some slough buildup this can require little bit of sharp debridement. She did not have a dressing on today but she had just recently taken a shower. Obviously that is okay but we do want to try to keep this covered is much as possible to help keep it from getting too wet. She is also has been having unfortunately some issues with urinary symptoms frequent urination being the main thing here. Again I do believe that still of utmost concern for her to get checked out if she has a UTI that needs to be addressed sooner rather than later. 11/10/2021 upon evaluation today patient appears to be doing well with regard to her wound from the standpoint of there being no infection unfortunately she does have a lot of moisture and this really is not being changed as far as the dressing is concerned. We have been using collagen but nobody is really been changing the dressings regularly as directed. Nonetheless her niece who is with her today says that she is can work on getting that done appropriately. Objective Constitutional Well-nourished and well-hydrated in no acute distress. Vitals Time Taken: 2:16 PM, Height: 60 in, Weight: 116 lbs, BMI: 22.7, Temperature: 98.2 F, Pulse: 86 bpm, Respiratory Rate: 18 breaths/min, Blood  Pressure: 145/83 mmHg. Respiratory normal breathing without difficulty. Psychiatric this patient is able to make decisions and demonstrates good insight into disease process. Alert and Oriented x 3. pleasant and cooperative. General Notes: Upon inspection patient's wound bed actually showed signs of good granulation and epithelization at this point. Fortunately there does not appear to be any signs of active infection locally nor systemically at this time which is great news and overall I am extremely pleased with where we stand today. Integumentary (Hair, Skin) Wound #1 status is Open. Original cause of wound was Pressure Injury. The date acquired was: 07/06/2021. The wound  has been in treatment 5 weeks. The wound is located on the Midline Sacrum. The wound measures 2.5cm length x 2cm width x 0.2cm depth; 3.927cm^2 area and 0.785cm^3 volume. There is Fat Layer (Subcutaneous Tissue) exposed. There is no tunneling or undermining noted. There is a medium amount of serous drainage noted. The wound margin is flat and intact. There is medium (34-66%) pink granulation within the wound bed. There is a medium (34-66%) amount of necrotic tissue within the wound bed including Adherent Slough. [name], [name]. ([id number]) Assessment Active Problems ICD-10 Cutaneous abscess of buttock Non-pressure chronic ulcer of buttock with fat layer exposed Essential (primary) hypertension [place] term (current) use of anticoagulants Vascular dementia without behavioral disturbance Procedures Wound #1 Pre-procedure diagnosis of Wound #1 is an Abscess located on the Midline Sacrum . There was a Excisional Skin/Subcutaneous Tissue Debridement with a total area of 5 sq cm performed by Tommie Sams., PA-C. With the following instrument(s): Curette to remove Viable and Non-Viable tissue/material. Material removed includes Subcutaneous Tissue and Slough and after achieving pain control using Lidocaine 4% Topical Solution. No  specimens were taken. A time out was conducted at 14:30, prior to the start of the procedure. A Minimum amount of bleeding was controlled with Pressure. The procedure was tolerated well with a pain level of 0 throughout and a pain level of 0 following the procedure. Post Debridement Measurements: 2.5cm length x 2cm width x 0.2cm depth; 0.785cm^3 volume. Character of Wound/Ulcer Post Debridement is improved. Post procedure Diagnosis Wound #1: Same as Pre-Procedure Plan Follow-up Appointments: Return Appointment in 2 weeks. Bathing/ Shower/ Hygiene: May shower; gently cleanse wound with antibacterial soap, rinse and pat dry prior to dressing wounds WOUND #1: - Sacrum Wound Laterality: Midline Primary Dressing: Prisma 4.34 (in) (Generic) 3 x Per Week/30 Days Discharge Instructions: Moisten w/normal saline or sterile water; Cover wound as directed. Do not remove from wound bed. Secondary Dressing: Zetuvit Plus Silicone Border Dressing 4x4 (in/in) (DME) (Generic) 3 x Per Week/30 Days 1. I am going to recommend him going to continue with the wound care measures as before and the patient is in agreement the plan. I am also going to suggest that we have her continue with the silver collagen which I think is good to be a good way to go. 2. I am also can recommend that we have the patient continue with the border foam dressing to cover which I think is doing a good job. We will see patient back for reevaluation in 2 weeks here in the clinic. If anything worsens or changes patient will contact our office for additional recommendations. Electronic Signature(s) Signed: 11/10/2021 2:36:30 PM By: Worthy Keeler PA-C Entered By: Worthy Keeler on 11/10/2021 14:36:29 [name], [name]. ([id number]) -------------------------------------------------------------------------------- SuperBill Details Patient Name: [name], [name]. Date of Service: 11/10/2021 Medical Record Number: [id number] Patient Account Number:  [id number] Date of Birth/Sex: 1931/12/28 (87 y.o. F) Treating RN: Carlene Coria Primary Care Provider: Antony Contras Other Clinician: Referring Provider: Antony Contras Treating Provider/Extender: Skipper Cliche in Treatment: 5 Diagnosis Coding ICD-10 Codes Code Description L02.31 Cutaneous abscess of buttock L98.412 Non-pressure chronic ulcer of buttock with fat layer exposed I10 Essential (primary) hypertension Z79.01 [place] term (current) use of anticoagulants F01.50 Vascular dementia without behavioral disturbance Facility Procedures CPT4 Code: [id number] Description: 11042 - DEB SUBQ TISSUE 20 SQ CM/< Modifier: Quantity: 1 CPT4 Code: Description: ICD-10 Diagnosis Description L98.412 Non-pressure chronic ulcer of buttock with fat layer exposed Modifier: Quantity: Physician Procedures CPT4 Code:  [id number] Description: 11042 - WC PHYS SUBQ TISS 20 SQ CM Modifier: Quantity: 1 CPT4 Code: Description: ICD-10 Diagnosis Description L98.412 Non-pressure chronic ulcer of buttock with fat layer exposed Modifier: Quantity: Electronic Signature(s) Signed: 11/10/2021 2:36:48 PM By: Worthy Keeler PA-C Entered By: Worthy Keeler on 11/10/2021 14:36:47

## 2021-11-11 NOTE — Progress Notes (Signed)
Pumphrey, [place] I. ([id number]) Visit Report for 11/10/2021 Arrival Information Details Patient Name: Stickles, Quintana I. Date of Service: 11/10/2021 2:00 PM Medical Record Number: [id number] Patient Account Number: [id number] Date of Birth/Sex: 05/26/1932 (87 y.o. F) Treating RN: Carlene Coria Primary Care Leaman Abe: Antony Contras Other Clinician: Referring Kamdyn Covel: Antony Contras Treating Meir Elwood/Extender: Skipper Cliche in Treatment: 5 Visit Information History Since Last Visit All ordered tests and consults were completed: No Patient Arrived: Ambulatory Added or deleted any medications: No Arrival Time: 14:11 Any new allergies or adverse reactions: No Accompanied By: neice Had a fall or experienced change in No Transfer Assistance: None activities of daily living that may affect Patient Identification Verified: Yes risk of falls: Secondary Verification Process Completed: Yes Signs or symptoms of abuse/neglect since last visito No Patient Requires Transmission-Based No Hospitalized since last visit: No Precautions: Implantable device outside of the clinic excluding No Patient Has Alerts: Yes cellular tissue based products placed in the center Patient Alerts: Patient on Blood since last visit: Thinner Has Dressing in Place as Prescribed: Yes NOT DIABETIC Pain Present Now: No ELIOQUIS Electronic Signature(s) Signed: 11/11/2021 10:49:38 AM By: Carlene Coria RN Entered By: Carlene Coria on 11/10/2021 14:15:55 Borras, Arissa I. ([id number]) -------------------------------------------------------------------------------- Clinic Level of Care Assessment Details Patient Name: Cater, Mette I. Date of Service: 11/10/2021 2:00 PM Medical Record Number: [id number] Patient Account Number: [id number] Date of Birth/Sex: 08/15/1932 (87 y.o. F) Treating RN: Carlene Coria Primary Care Olie Scaffidi: Antony Contras Other Clinician: Referring Tangela Dolliver: Antony Contras Treating Aeneas Longsworth/Extender: Skipper Cliche in Treatment: 5 Clinic Level of Care Assessment Items TOOL 1 Quantity Score []  - Use when EandM and Procedure is performed on INITIAL visit 0 ASSESSMENTS - Nursing Assessment / Reassessment []  - General Physical Exam (combine w/ comprehensive assessment (listed just below) when performed on new 0 pt. evals) []  - 0 Comprehensive Assessment (HX, ROS, Risk Assessments, Wounds Hx, etc.) ASSESSMENTS - Wound and Skin Assessment / Reassessment []  - Dermatologic / Skin Assessment (not related to wound area) 0 ASSESSMENTS - Ostomy and/or Continence Assessment and Care []  - Incontinence Assessment and Management 0 []  - 0 Ostomy Care Assessment and Management (repouching, etc.) PROCESS - Coordination of Care []  - Simple Patient / Family Education for ongoing care 0 []  - 0 Complex (extensive) Patient / Family Education for ongoing care []  - 0 Staff obtains Programmer, systems, Records, Test Results / Process Orders []  - 0 Staff telephones HHA, Nursing Homes / Clarify orders / etc []  - 0 Routine Transfer to another Facility (non-emergent condition) []  - 0 Routine Hospital Admission (non-emergent condition) []  - 0 New Admissions / Biomedical engineer / Ordering NPWT, Apligraf, etc. []  - 0 Emergency Hospital Admission (emergent condition) PROCESS - Special Needs []  - Pediatric / Minor Patient Management 0 []  - 0 Isolation Patient Management []  - 0 Hearing / Language / Visual special needs []  - 0 Assessment of Community assistance (transportation, D/C planning, etc.) []  - 0 Additional assistance / Altered mentation []  - 0 Support Surface(s) Assessment (bed, cushion, seat, etc.) INTERVENTIONS - Miscellaneous []  - External ear exam 0 []  - 0 Patient Transfer (multiple staff / Civil Service fast streamer / Similar devices) []  - 0 Simple Staple / Suture removal (25 or less) []  - 0 Complex Staple / Suture removal (26 or more) []  - 0 Hypo/Hyperglycemic Management (do not check if billed  separately) []  - 0 Ankle / Brachial Index (ABI) - do not check if billed separately Has the patient been seen at the hospital within  the last three years: Yes Total Score: 0 Level Of Care: ____ Debroah Loop I. ([id number]) Electronic Signature(s) Signed: 11/11/2021 10:49:38 AM By: Carlene Coria RN Entered By: Carlene Coria on 11/10/2021 14:37:00 Vierra, Allannah I. ([id number]) -------------------------------------------------------------------------------- Encounter Discharge Information Details Patient Name: Randle, Dajiah I. Date of Service: 11/10/2021 2:00 PM Medical Record Number: [id number] Patient Account Number: [id number] Date of Birth/Sex: 30-Aug-1932 (87 y.o. F) Treating RN: Carlene Coria Primary Care Martie Fulgham: Antony Contras Other Clinician: Referring Hether Anselmo: Antony Contras Treating Amrutha Avera/Extender: Skipper Cliche in Treatment: 5 Encounter Discharge Information Items Post Procedure Vitals Discharge Condition: Stable Temperature (F): 98.2 Ambulatory Status: Ambulatory Pulse (bpm): 86 Discharge Destination: Home Respiratory Rate (breaths/min): 18 Transportation: Private Auto Blood Pressure (mmHg): 145/83 Accompanied By: neice Schedule Follow-up Appointment: Yes Clinical Summary of Care: Patient Declined Electronic Signature(s) Signed: 11/11/2021 10:49:38 AM By: Carlene Coria RN Entered By: Carlene Coria on 11/10/2021 14:38:12 Mccowan, Lakiyah I. ([id number]) -------------------------------------------------------------------------------- Lower Extremity Assessment Details Patient Name: Somes, Illeana I. Date of Service: 11/10/2021 2:00 PM Medical Record Number: [id number] Patient Account Number: [id number] Date of Birth/Sex: [birth year] (87 y.o. F) Treating RN: Carlene Coria Primary Care Enza Shone: Antony Contras Other Clinician: Referring Shepard Keltz: Antony Contras Treating Rayhana Slider/Extender: Skipper Cliche in Treatment: 5 Electronic Signature(s) Signed: 11/11/2021 10:49:38 AM By: Carlene Coria RN Entered By: Carlene Coria on 11/10/2021 14:20:16 Porcelli, Sade I. ([id number]) -------------------------------------------------------------------------------- Multi Wound Chart Details Patient Name: Picking, Betzaira I. Date of Service: 11/10/2021 2:00 PM Medical Record Number: [id number] Patient Account Number: [id number] Date of Birth/Sex: [birth year] (87 y.o. F) Treating RN: Carlene Coria Primary Care Charlynn Salih: Antony Contras Other Clinician: Referring Deldrick Linch: Antony Contras Treating Juanice Warburton/Extender: Skipper Cliche in Treatment: 5 Vital Signs Height(in): 60 Pulse(bpm): 86 Weight(lbs): 116 Blood Pressure(mmHg): 145/83 Body Mass Index(BMI): 23 Temperature(F): 98.2 Respiratory Rate(breaths/min): 18 Photos: [N/A:N/A] Wound Location: Midline Sacrum N/A N/A Wounding Event: Pressure Injury N/A N/A Primary Etiology: Abscess N/A N/A Comorbid History: Hypertension, Peripheral Venous N/A N/A Disease, Received Chemotherapy Date Acquired: 07/06/2021 N/A N/A Weeks of Treatment: 5 N/A N/A Wound Status: Open N/A N/A Measurements L x W x D (cm) 2.5x2x0.2 N/A N/A Area (cm) : 3.927 N/A N/A Volume (cm) : 0.785 N/A N/A % Reduction in Area: 28.60% N/A N/A % Reduction in Volume: 28.60% N/A N/A Classification: Full Thickness Without Exposed N/A N/A Support Structures Exudate Amount: Medium N/A N/A Exudate Type: Serous N/A N/A Exudate Color: amber N/A N/A Wound Margin: Flat and Intact N/A N/A Granulation Amount: Medium (34-66%) N/A N/A Granulation Quality: Pink N/A N/A Necrotic Amount: Medium (34-66%) N/A N/A Exposed Structures: Fat Layer (Subcutaneous Tissue): N/A N/A Yes Fascia: No Tendon: No Muscle: No Joint: No Bone: No Epithelialization: None N/A N/A Treatment Notes Electronic Signature(s) Signed: 11/11/2021 10:49:38 AM By: Carlene Coria RN Entered By: Carlene Coria on 11/10/2021 14:21:17 Fraticelli, Letonya I.  ([id number]) -------------------------------------------------------------------------------- Multi-Disciplinary Care Plan Details Patient Name: Scroggin, Nehemiah I. Date of Service: 11/10/2021 2:00 PM Medical Record Number: [id number] Patient Account Number: [id number] Date of Birth/Sex: Apr 06, 1932 (87 y.o. F) Treating RN: Carlene Coria Primary Care Jamier Urbas: Antony Contras Other Clinician: Referring Tashana Haberl: Antony Contras Treating Carson Meche/Extender: Skipper Cliche in Treatment: 5 Active Inactive Abuse / Safety / Falls / Self Care Management Nursing Diagnoses: Potential for falls Self care deficit: actual or potential Goals: Patient will remain injury free related to falls Date Initiated: 10/05/2021 Target Resolution Date: 10/05/2021 Goal Status: Active Interventions: Provide education on safe transfers Notes: Necrotic Tissue Nursing Diagnoses: Impaired tissue integrity related to necrotic/devitalized tissue Knowledge deficit related  to management of necrotic/devitalized tissue Goals: Necrotic/devitalized tissue will be minimized in the wound bed Date Initiated: 10/05/2021 Target Resolution Date: 10/02/2021 Goal Status: Active Patient/caregiver will verbalize understanding of reason and process for debridement of necrotic tissue Date Initiated: 10/05/2021 Target Resolution Date: 10/02/2021 Goal Status: Active Interventions: Assess patient pain level pre-, during and post procedure and prior to discharge Provide education on necrotic tissue and debridement process Treatment Activities: Apply topical anesthetic as ordered : 10/05/2021 Excisional debridement : 10/05/2021 Notes: Nutrition Nursing Diagnoses: Potential for alteratiion in Nutrition/Potential for imbalanced nutrition Goals: Patient/caregiver agrees to and verbalizes understanding of need to use nutritional supplements and/or vitamins as prescribed Date Initiated: 10/19/2021 Target Resolution Date: 10/19/2021 Goal  Status: Active Interventions: Provide education on nutrition Notes: Adamik, Paiten I. ([id number]) Pain, Acute or Chronic Nursing Diagnoses: Pain Management - Non-cyclic Acute (Procedural) Goals: Patient will verbalize adequate pain control and receive pain control interventions during procedures as needed Date Initiated: 10/05/2021 Target Resolution Date: 10/02/2021 Goal Status: Active Interventions: Assess comfort goal upon admission Treatment Activities: Administer pain control measures as ordered : 10/05/2021 Notes: Wound/Skin Impairment Nursing Diagnoses: Impaired tissue integrity Knowledge deficit related to ulceration/compromised skin integrity Goals: Ulcer/skin breakdown will have a volume reduction of 30% by week 4 Date Initiated: 10/05/2021 Target Resolution Date: 10/30/2021 Goal Status: Active Ulcer/skin breakdown will have a volume reduction of 50% by week 8 Date Initiated: 10/05/2021 Target Resolution Date: 11/27/2021 Goal Status: Active Ulcer/skin breakdown will have a volume reduction of 80% by week 12 Date Initiated: 10/05/2021 Target Resolution Date: 12/25/2021 Goal Status: Active Ulcer/skin breakdown will heal within 14 weeks Date Initiated: 10/05/2021 Target Resolution Date: 01/08/2022 Goal Status: Active Interventions: Assess patient/caregiver ability to obtain necessary supplies Provide education on ulcer and skin care Treatment Activities: Referred to DME Tynetta Bachmann for dressing supplies : 10/05/2021 Topical wound management initiated : 10/05/2021 Notes: Electronic Signature(s) Signed: 11/11/2021 10:49:38 AM By: Carlene Coria RN Entered By: Carlene Coria on 11/10/2021 14:20:54 Karney, May I. ([id number]) -------------------------------------------------------------------------------- Pain Assessment Details Patient Name: Parran, Mikaela I. Date of Service: 11/10/2021 2:00 PM Medical Record Number: [id number] Patient Account Number: [id number] Date of Birth/Sex:  02/22/1932 (87 y.o. F) Treating RN: Carlene Coria Primary Care Bracy Pepper: Antony Contras Other Clinician: Referring Allanna Bresee: Antony Contras Treating Leif Loflin/Extender: Skipper Cliche in Treatment: 5 Active Problems Location of Pain Severity and Description of Pain Patient Has Paino No Site Locations Pain Management and Medication Current Pain Management: Electronic Signature(s) Signed: 11/11/2021 10:49:38 AM By: Carlene Coria RN Entered By: Carlene Coria on 11/10/2021 14:16:51 Crutchley, Prezley I. ([id number]) -------------------------------------------------------------------------------- Patient/Caregiver Education Details Patient Name: Citron, Shinika I. Date of Service: 11/10/2021 2:00 PM Medical Record Number: [id number] Patient Account Number: [id number] Date of Birth/Gender: 12/16/1931 (87 y.o. F) Treating RN: Carlene Coria Primary Care Physician: Antony Contras Other Clinician: Referring Physician: Antony Contras Treating Physician/Extender: Skipper Cliche in Treatment: 5 Education Assessment Education Provided To: Patient Education Topics Provided Nutrition: Methods: Explain/Verbal Responses: State content correctly Electronic Signature(s) Signed: 11/11/2021 10:49:38 AM By: Carlene Coria RN Entered By: Carlene Coria on 11/10/2021 14:37:17 Erdmann, Mohogany I. ([id number]) -------------------------------------------------------------------------------- Wound Assessment Details Patient Name: Rapley, Chalese I. Date of Service: 11/10/2021 2:00 PM Medical Record Number: [id number] Patient Account Number: [id number] Date of Birth/Sex: 1932-01-28 (87 y.o. F) Treating RN: Carlene Coria Primary Care Jahson Emanuele: Antony Contras Other Clinician: Referring Tali Cleaves: Antony Contras Treating Allana Shrestha/Extender: Skipper Cliche in Treatment: 5 Wound Status Wound Number: 1 Primary Abscess Etiology: Wound Location: Midline Sacrum Wound Status: Open Wounding Event: Pressure Injury  Comorbid Hypertension,  Peripheral Venous Disease, Received Date Acquired: 07/06/2021 History: Chemotherapy Weeks Of Treatment: 5 Clustered Wound: No Photos Wound Measurements Length: (cm) 2.5 Width: (cm) 2 Depth: (cm) 0.2 Area: (cm) 3.927 Volume: (cm) 0.785 % Reduction in Area: 28.6% % Reduction in Volume: 28.6% Epithelialization: None Tunneling: No Undermining: No Wound Description Classification: Full Thickness Without Exposed Support Structu Wound Margin: Flat and Intact Exudate Amount: Medium Exudate Type: Serous Exudate Color: amber res Foul Odor After Cleansing: No Slough/Fibrino Yes Wound Bed Granulation Amount: Medium (34-66%) Exposed Structure Granulation Quality: Pink Fascia Exposed: No Necrotic Amount: Medium (34-66%) Fat Layer (Subcutaneous Tissue) Exposed: Yes Necrotic Quality: Adherent Slough Tendon Exposed: No Muscle Exposed: No Joint Exposed: No Bone Exposed: No Treatment Notes Wound #1 (Sacrum) Wound Laterality: Midline Cleanser Peri-Wound Care Topical Rama, Salima I. ([id number]) Primary Dressing Prisma 4.34 (in) Discharge Instruction: Moisten w/normal saline or sterile water; Cover wound as directed. Do not remove from wound bed. Secondary Dressing Zetuvit Plus Silicone Border Dressing 4x4 (in/in) Secured With Compression Wrap Compression Stockings Add-Ons Electronic Signature(s) Signed: 11/11/2021 10:49:38 AM By: Carlene Coria RN Entered By: Carlene Coria on 11/10/2021 14:19:46 Buskirk, Alima I. ([id number]) -------------------------------------------------------------------------------- Vitals Details Patient Name: Simonet, Hasana I. Date of Service: 11/10/2021 2:00 PM Medical Record Number: [id number] Patient Account Number: [id number] Date of Birth/Sex: [birth year] (87 y.o. F) Treating RN: Carlene Coria Primary Care Jafar Poffenberger: Antony Contras Other Clinician: Referring Ligia Duguay: Antony Contras Treating Tykiera Raven/Extender: Skipper Cliche in Treatment: 5 Vital Signs Time  Taken: 14:16 Temperature (F): 98.2 Height (in): 60 Pulse (bpm): 86 Weight (lbs): 116 Respiratory Rate (breaths/min): 18 Body Mass Index (BMI): 22.7 Blood Pressure (mmHg): 145/83 Reference Range: 80 - 120 mg / dl Electronic Signature(s) Signed: 11/11/2021 10:49:38 AM By: Carlene Coria RN Entered By: Carlene Coria on 11/10/2021 14:16:41

## 2021-11-19 NOTE — Progress Notes (Signed)
Oncology Nurse Navigator Documentation   Placed introductory call to new referral patient [name]. I spoke with her son [name]. Introduced myself as the H&N oncology nurse navigator that works with Dr. Isidore Moos to whom she has been referred by Dr. Chryl Heck. He confirmed understanding of referral. Briefly explained my role as her/their navigator, provided my contact information.  Confirmed understanding of upcoming appts and [place] location, explained arrival and registration process. I encouraged him to call with questions/concerns as she moves forward with appts and procedures.   He verbalized understanding of information provided, expressed appreciation for my call.   Navigator Initial Assessment Employment Status:she is retired Currently on [employer] / STD:na Living Situation: with family Support System:family, son JJH:ERDEY Swayne MD PCD:na Financial Concerns:no Transportation Needs: no Designer, industrial/product Needed:  no Ambulation Needs: no DME Used in Home: no Psychosocial Needs:  no Concerns/Needs Understanding Cancer:  addressed/answered by navigator to best of ability Self-Expressed Needs: no   Clinical biochemist, BSN, OCN Head & Neck Oncology Nurse [place] [facility] Phone # [phone]  Fax # [phone]

## 2021-11-20 ENCOUNTER — Encounter (HOSPITAL_BASED_OUTPATIENT_CLINIC_OR_DEPARTMENT_OTHER): Payer: Self-pay | Admitting: Emergency Medicine

## 2021-11-20 ENCOUNTER — Emergency Department (HOSPITAL_BASED_OUTPATIENT_CLINIC_OR_DEPARTMENT_OTHER): Payer: Medicare HMO | Admitting: Radiology

## 2021-11-20 ENCOUNTER — Emergency Department (HOSPITAL_BASED_OUTPATIENT_CLINIC_OR_DEPARTMENT_OTHER): Payer: Medicare HMO

## 2021-11-20 ENCOUNTER — Other Ambulatory Visit: Payer: Self-pay

## 2021-11-20 DIAGNOSIS — I10 Essential (primary) hypertension: Secondary | ICD-10-CM | POA: Insufficient documentation

## 2021-11-20 DIAGNOSIS — M2578 Osteophyte, vertebrae: Secondary | ICD-10-CM | POA: Diagnosis not present

## 2021-11-20 DIAGNOSIS — S0990XA Unspecified injury of head, initial encounter: Secondary | ICD-10-CM | POA: Diagnosis not present

## 2021-11-20 DIAGNOSIS — M25462 Effusion, left knee: Secondary | ICD-10-CM | POA: Diagnosis not present

## 2021-11-20 DIAGNOSIS — E041 Nontoxic single thyroid nodule: Secondary | ICD-10-CM | POA: Insufficient documentation

## 2021-11-20 DIAGNOSIS — Z79899 Other long term (current) drug therapy: Secondary | ICD-10-CM | POA: Diagnosis not present

## 2021-11-20 DIAGNOSIS — W228XXA Striking against or struck by other objects, initial encounter: Secondary | ICD-10-CM | POA: Diagnosis not present

## 2021-11-20 DIAGNOSIS — Z7901 Long term (current) use of anticoagulants: Secondary | ICD-10-CM | POA: Diagnosis not present

## 2021-11-20 DIAGNOSIS — M25562 Pain in left knee: Secondary | ICD-10-CM | POA: Diagnosis not present

## 2021-11-20 DIAGNOSIS — S8992XA Unspecified injury of left lower leg, initial encounter: Secondary | ICD-10-CM | POA: Diagnosis not present

## 2021-11-20 DIAGNOSIS — W19XXXA Unspecified fall, initial encounter: Secondary | ICD-10-CM | POA: Diagnosis not present

## 2021-11-20 NOTE — ED Triage Notes (Signed)
Pt arrives pov, to triage in wheelchair, endorses fall x 2 days pta. Son reports pt fell out of bed. Pt reports hitting head, denies loc. Pt c/o left knee pain, swelling, tenderness noted. Pt takes thinners. AOx3, pt at baseline per family member

## 2021-11-21 ENCOUNTER — Emergency Department (HOSPITAL_BASED_OUTPATIENT_CLINIC_OR_DEPARTMENT_OTHER)
Admission: EM | Admit: 2021-11-21 | Discharge: 2021-11-21 | Disposition: A | Discharge status: Home / Self Care | Payer: Medicare HMO | Attending: Emergency Medicine | Admitting: Emergency Medicine

## 2021-11-21 DIAGNOSIS — E041 Nontoxic single thyroid nodule: Secondary | ICD-10-CM

## 2021-11-21 DIAGNOSIS — M25462 Effusion, left knee: Secondary | ICD-10-CM

## 2021-11-21 HISTORY — DX: Malignant (primary) neoplasm, unspecified: C80.1

## 2021-11-21 LAB — CBC WITH DIFFERENTIAL/PLATELET
Abs Immature Granulocytes: 0.02 10*3/uL (ref 0.00–0.07)
Basophils Absolute: 0 10*3/uL (ref 0.0–0.1)
Basophils Relative: 0 %
Eosinophils Absolute: 0.1 10*3/uL (ref 0.0–0.5)
Eosinophils Relative: 2 %
HCT: 34.7 % — ABNORMAL LOW (ref 36.0–46.0)
Hemoglobin: 11 g/dL — ABNORMAL LOW (ref 12.0–15.0)
Immature Granulocytes: 0 %
Lymphocytes Relative: 36 %
Lymphs Abs: 1.8 10*3/uL (ref 0.7–4.0)
MCH: 23 pg — ABNORMAL LOW (ref 26.0–34.0)
MCHC: 31.7 g/dL (ref 30.0–36.0)
MCV: 72.6 fL — ABNORMAL LOW (ref 80.0–100.0)
Monocytes Absolute: 0.6 10*3/uL (ref 0.1–1.0)
Monocytes Relative: 12 %
Neutro Abs: 2.4 10*3/uL (ref 1.7–7.7)
Neutrophils Relative %: 50 %
Platelets: 255 10*3/uL (ref 150–400)
RBC: 4.78 MIL/uL (ref 3.87–5.11)
RDW: 15.1 % (ref 11.5–15.5)
WBC: 4.9 10*3/uL (ref 4.0–10.5)
nRBC: 0 % (ref 0.0–0.2)

## 2021-11-21 LAB — COMPREHENSIVE METABOLIC PANEL
ALT: 15 U/L (ref 0–44)
AST: 19 U/L (ref 15–41)
Albumin: 3.7 g/dL (ref 3.5–5.0)
Alkaline Phosphatase: 88 U/L (ref 38–126)
Anion gap: 9 (ref 5–15)
BUN: 17 mg/dL (ref 8–23)
CO2: 28 mmol/L (ref 22–32)
Calcium: 9.4 mg/dL (ref 8.9–10.3)
Chloride: 106 mmol/L (ref 98–111)
Creatinine, Ser: 0.55 mg/dL (ref 0.44–1.00)
GFR, Estimated: >60 mL/min (ref >60)
Glucose, Bld: 102 mg/dL — ABNORMAL HIGH (ref 70–99)
Potassium: 3.6 mmol/L (ref 3.5–5.1)
Sodium: 143 mmol/L (ref 135–145)
Total Bilirubin: 0.5 mg/dL (ref 0.3–1.2)
Total Protein: 7.1 g/dL (ref 6.5–8.1)

## 2021-11-21 LAB — PROTIME-INR
INR: 1.1 (ref 0.8–1.2)
Prothrombin Time: 14.1 seconds (ref 11.4–15.2)

## 2021-11-21 LAB — SYNOVIAL CELL COUNT + DIFF, W/ CRYSTALS
Eosinophils-Synovial: 0 % (ref 0–1)
Lymphocytes-Synovial Fld: 5 % (ref 0–20)
Monocyte-Macrophage-Synovial Fluid: 7 % — ABNORMAL LOW (ref 50–90)
Neutrophil, Synovial: 88 % — ABNORMAL HIGH (ref 0–25)
WBC, Synovial: 9610 /mm3 — ABNORMAL HIGH (ref 0–200)

## 2021-11-21 MED ORDER — LIDOCAINE-EPINEPHRINE 2 %-1:100000 IJ SOLN
20.0000 mL | Freq: Once | INTRAMUSCULAR | Status: AC
  Administered 2021-11-21: 20 mL
  Filled 2021-11-21: qty 20.4

## 2021-11-21 NOTE — ED Provider Notes (Signed)
[facility] EMERGENCY DEPT Provider Note   CSN: [id number] Arrival date & time: 11/20/21  1818     History Chief Complaint  Patient presents with   [name] is a 87 y.o. female.  87 yo who had a fall earlier in the week. On eliquis. Had significant swelling and eyrthema to knee, but improving now. Wasn't able to walk initially but that is improving as well. No fevers. Has a h/o melanoma. Also hit her head when she fell.    Fall      Past Medical History:  Diagnosis Date   Anemia    Arthritis    knees    Cancer ([place])    GERD (gastroesophageal reflux disease)    Hiatal hernia    History of inguinal hernia repair    BIH   Hyperlipidemia    Hypertension    Memory problem     Patient Active Problem List   Diagnosis Date Noted   Pressure ulcer 08/27/2021   Urinary incontinence 08/26/2021   Port-A-Cath in place 08/05/2021   Abnormal TSH 08/05/2021   Weight loss, unintentional 07/15/2021   Adjustment disorder 07/08/2021   Malnutrition of mild degree [name]: 75% to less than 90% of standard weight) ([place]) 07/08/2021   Mixed hyperlipidemia 06/30/2021   Other specified disorders of bone density and structure, other site 06/30/2021   Anemia 06/30/2021   Allergic rhinitis 06/30/2021   Essential hypertension 06/30/2021   Vitamin D deficiency 06/30/2021   Normocytic normochromic anemia 06/23/2021   Diffuse large B-cell lymphoma ([place]) 05/27/2021   Bilateral inguinal hernia 10/13/2011    Past Surgical History:  Procedure Laterality Date   COLONOSCOPY     HERNIA REPAIR  10/13/11   lap BIH rep w/ mesh- Dr. Tora Kindred HERNIA REPAIR  10/13/2011   Procedure: LAPAROSCOPIC INGUINAL HERNIA;  Surgeon: Adin Hector, MD;  Location: [facility];  Service: General;  Laterality: Bilateral;  Laparoscopic bilateral inguinal hernia repair, converted to open bilateral inguinal hernia repairs with mesh   IR IMAGING GUIDED PORT INSERTION  05/06/2021    NASAL ENDOSCOPY Left 04/08/2021   Procedure: NASAL ENDOSCOPY WITH BIOPSY OF LEFT NASOPPHARYNGEAL MASS;  Surgeon:  Coop, DO;  Location: [facility];  Service: ENT;  Laterality: Left;  Frozen Section     OB History   No obstetric history on file.     Family History  Problem Relation Age of Onset   Heart disease Mother     Social History   Tobacco Use   Smoking status: Never   Smokeless tobacco: Never  Vaping Use   Vaping Use: Never used  Substance Use Topics   Alcohol use: No   Drug use: No    Home Medications Prior to Admission medications   Medication Sig Start Date End Date Taking? Authorizing Provider  acetaminophen (TYLENOL) 500 MG tablet Take 1,000 mg by mouth every 6 (six) hours as needed for mild pain or fever.    [provider]  allopurinol (ZYLOPRIM) 300 MG tablet Take 1 tablet (300 mg total) by mouth daily. 07/15/21   Benay Pike, MD  amLODipine (NORVASC) 10 MG tablet Take 10 mg by mouth daily.    [provider]  apixaban (ELIQUIS) 5 MG TABS tablet Take 1 tablet (5 mg total) by mouth 2 (two) times daily. 08/05/21   Benay Pike, MD  atorvastatin (LIPITOR) 80 MG tablet Take 80 mg by mouth every morning.    [provider]  ezetimibe (ZETIA) 10 MG tablet Take 10 mg by mouth every morning.    [provider]  HYDROcodone-acetaminophen (NORCO/VICODIN) 5-325 MG tablet Take 1 tablet by mouth every 4 (four) hours as needed. 05/28/21   Isla Pence, MD  lidocaine-prilocaine (EMLA) cream Apply to affected area once 05/29/21   Benay Pike, MD  LORazepam (ATIVAN) 0.5 MG tablet Take 1 tablet (0.5 mg total) by mouth every 6 (six) hours as needed (Nausea or vomiting). 05/29/21   Benay Pike, MD  memantine (NAMENDA) 10 MG tablet Take 1/2 tablet (5 mg) for 1 week at bedtime, then 1 tablet (10 mg) at bedtime. 07/01/21   Alric Ran, MD  memantine (NAMENDA) 10 MG tablet Take 1 tablet (10 mg total) by mouth at bedtime. 07/31/21    Alric Ran, MD  ondansetron (ZOFRAN) 8 MG tablet Take 1 tablet (8 mg total) by mouth 2 (two) times daily as needed for refractory nausea / vomiting. Start on day 3 after cyclophosphamide chemotherapy. 05/29/21   Benay Pike, MD  predniSONE (DELTASONE) 20 MG tablet Take 3 tablets (60 mg total) by mouth daily. Take with food on days 1-5 of chemotherapy. 09/16/21   Orson Slick, MD  prochlorperazine (COMPAZINE) 10 MG tablet Take 1 tablet (10 mg total) by mouth every 6 (six) hours as needed (Nausea or vomiting). 05/29/21   Benay Pike, MD    Allergies    Patient has no known allergies.  Review of Systems   Review of Systems  All other systems reviewed and are negative.  Physical Exam Updated Vital Signs BP 125/69    Pulse 87    Temp 97.8 F (36.6 C)    Resp 18    Ht 4\' 11"  (1.499 m)    Wt 53.1 kg    SpO2 97%    BMI 23.63 kg/m   Physical Exam Vitals and nursing note reviewed.  Constitutional:      Appearance: She is well-developed.  HENT:     Head: Normocephalic and atraumatic.     Nose: Nose normal. No congestion or rhinorrhea.     Mouth/Throat:     Mouth: Mucous membranes are moist.     Pharynx: Oropharynx is clear.  Cardiovascular:     Rate and Rhythm: Normal rate and regular rhythm.  Pulmonary:     Effort: Pulmonary effort is normal. No respiratory distress.     Breath sounds: No stridor.  Abdominal:     General: Abdomen is flat. There is no distension.  Musculoskeletal:        General: Swelling and tenderness present. Normal range of motion.     Cervical back: Normal range of motion.  Skin:    Findings: Erythema (to left knee. pain with ROM as well., DP pulse intact.) present.  Neurological:     General: No focal deficit present.     Mental Status: She is alert.    ED Results / Procedures / Treatments   Labs (all labs ordered are listed, but only abnormal results are displayed) Labs Reviewed  BODY FLUID CULTURE W GRAM STAIN  CBC WITH  DIFFERENTIAL/PLATELET  COMPREHENSIVE METABOLIC PANEL  GLUCOSE, BODY FLUID OTHER            PROTEIN, BODY FLUID (OTHER)  SYNOVIAL CELL COUNT + DIFF, W/ CRYSTALS  URIC ACID, BODY FLUID    EKG None  Radiology CT Head Wo Contrast  Result Date: 11/20/2021 CLINICAL DATA:  Head trauma, minor, normal mental status (Age 9-64y); Neck trauma, midline  tenderness (Age 13-64y). Fall, head injury. EXAM: CT HEAD WITHOUT CONTRAST CT CERVICAL SPINE WITHOUT CONTRAST TECHNIQUE: Multidetector CT imaging of the head and cervical spine was performed following the standard protocol without intravenous contrast. Multiplanar CT image reconstructions of the cervical spine were also generated. COMPARISON:  None. FINDINGS: CT HEAD FINDINGS Brain: Normal anatomic configuration. Parenchymal volume loss is commensurate with the patient's age. Mild periventricular white matter changes are present likely reflecting the sequela of small vessel ischemia. No abnormal intra or extra-axial mass lesion or fluid collection. No abnormal mass effect or midline shift. No evidence of acute intracranial hemorrhage or infarct. Ventricular size is normal. Cerebellum unremarkable. Vascular: No asymmetric hyperdense vasculature at the skull base. Skull: Intact Sinuses/Orbits: Paranasal sinuses are clear. Orbits are unremarkable. Other: Mastoid air cells and middle ear cavities are clear. CT CERVICAL SPINE FINDINGS Alignment: Overall straightening.  No listhesis. Skull base and vertebrae: Craniocervical alignment is normal. Landau dental interval is not widened. No acute fracture of the cervical spine. Vertebral body height is preserved. Soft tissues and spinal canal: Posterior disc osteophyte complex in combination with hypertrophy of the lamina propria results in mild central canal stenosis with flattening of the thecal sac at C4-5, C5-6 and C6-7. No canal hematoma. No prevertebral soft tissue swelling or fluid collection. Disc levels: There is  diffuse severe intervertebral disc space narrowing and endplate remodeling throughout the cervical spine in keeping with changes of diffuse degenerative disc disease. The prevertebral soft tissues are not thickened on sagittal reformats. Review of the axial images demonstrates multilevel uncovertebral and facet arthrosis resulting in multilevel moderate to severe neuroforaminal narrowing, most severe at C2-3, C3-4, and C4-5. Upper chest: Multinodular thyroid goiter is partially visualized. The visualized upper thorax otherwise unremarkable. Other: There is an asymmetric nodular mass soft tissue mass identified within the posterior oropharynx which appears separate from the uvula and is of unclear etiology on this noncontrast examination. IMPRESSION: No acute intracranial injury.  No calvarial fracture. No acute fracture or listhesis of the cervical spine. Nodular soft tissue mass within the posterior oropharynx. It is unclear whether this represents a primary mass or foreign body within the posterior oropharynx. Correlation with direct visualization is recommended. Incidental heterogeneous and enlarged thyroid. Recommend thyroid US if clinically warranted given patient age. Reference: J Am Coll Radiol. 2015 Feb;12(2): 143-50 Electronically Signed   By: Fidela Salisbury M.D.   On: 11/20/2021 22:36   CT Cervical Spine Wo Contrast  Result Date: 11/20/2021 CLINICAL DATA:  Head trauma, minor, normal mental status (Age 36-64y); Neck trauma, midline tenderness (Age 13-64y). Fall, head injury. EXAM: CT HEAD WITHOUT CONTRAST CT CERVICAL SPINE WITHOUT CONTRAST TECHNIQUE: Multidetector CT imaging of the head and cervical spine was performed following the standard protocol without intravenous contrast. Multiplanar CT image reconstructions of the cervical spine were also generated. COMPARISON:  None. FINDINGS: CT HEAD FINDINGS Brain: Normal anatomic configuration. Parenchymal volume loss is commensurate with the patient's  age. Mild periventricular white matter changes are present likely reflecting the sequela of small vessel ischemia. No abnormal intra or extra-axial mass lesion or fluid collection. No abnormal mass effect or midline shift. No evidence of acute intracranial hemorrhage or infarct. Ventricular size is normal. Cerebellum unremarkable. Vascular: No asymmetric hyperdense vasculature at the skull base. Skull: Intact Sinuses/Orbits: Paranasal sinuses are clear. Orbits are unremarkable. Other: Mastoid air cells and middle ear cavities are clear. CT CERVICAL SPINE FINDINGS Alignment: Overall straightening.  No listhesis. Skull base and vertebrae: Craniocervical alignment is normal. Landau dental  interval is not widened. No acute fracture of the cervical spine. Vertebral body height is preserved. Soft tissues and spinal canal: Posterior disc osteophyte complex in combination with hypertrophy of the lamina propria results in mild central canal stenosis with flattening of the thecal sac at C4-5, C5-6 and C6-7. No canal hematoma. No prevertebral soft tissue swelling or fluid collection. Disc levels: There is diffuse severe intervertebral disc space narrowing and endplate remodeling throughout the cervical spine in keeping with changes of diffuse degenerative disc disease. The prevertebral soft tissues are not thickened on sagittal reformats. Review of the axial images demonstrates multilevel uncovertebral and facet arthrosis resulting in multilevel moderate to severe neuroforaminal narrowing, most severe at C2-3, C3-4, and C4-5. Upper chest: Multinodular thyroid goiter is partially visualized. The visualized upper thorax otherwise unremarkable. Other: There is an asymmetric nodular mass soft tissue mass identified within the posterior oropharynx which appears separate from the uvula and is of unclear etiology on this noncontrast examination. IMPRESSION: No acute intracranial injury.  No calvarial fracture. No acute fracture or  listhesis of the cervical spine. Nodular soft tissue mass within the posterior oropharynx. It is unclear whether this represents a primary mass or foreign body within the posterior oropharynx. Correlation with direct visualization is recommended. Incidental heterogeneous and enlarged thyroid. Recommend thyroid US if clinically warranted given patient age. Reference: J Am Coll Radiol. 2015 Feb;12(2): 143-50 Electronically Signed   By: Fidela Salisbury M.D.   On: 11/20/2021 22:36   DG Knee Complete 4 Views Left  Result Date: 11/20/2021 CLINICAL DATA:  Trauma, fall, pain EXAM: LEFT KNEE - COMPLETE 4+ VIEW COMPARISON:  None. FINDINGS: No recent fracture or dislocation is seen. Osteopenia is seen in bony structures. Degenerative changes are noted with bony spurs in medial, lateral and patellofemoral compartments, more so in the lateral compartment. Osteopenia is seen in the bony structures. There is soft tissue fullness in the suprapatellar bursa. Arterial calcifications are seen in the soft tissues. IMPRESSION: No recent fracture or dislocation is seen. There is moderate to large effusion in the suprapatellar bursa. Osteopenia. Degenerative changes are noted, more severe in the lateral compartment. Electronically Signed   By: Elmer Picker M.D.   On: 11/20/2021 19:58    Procedures .Joint Aspiration/Arthrocentesis  Date/Time: 11/21/2021 10:50 PM Performed by: Merrily Pew, MD Authorized by: Merrily Pew, MD   Consent:    Consent obtained:  Verbal   Consent given by:  Patient and healthcare agent   Risks, benefits, and alternatives were discussed: yes     Risks discussed:  Bleeding, infection, pain, nerve damage and incomplete drainage   Alternatives discussed:  No treatment, delayed treatment, alternative treatment and observation Universal protocol:    Procedure explained and questions answered to patient or proxy's satisfaction: yes     Relevant documents present and verified: yes     Test  results available: yes     Imaging studies available: yes     Patient identity confirmed:  Verbally with patient and hospital-assigned identification number Location:    Location:  Knee   Knee:  L knee Anesthesia:    Anesthesia method:  Local infiltration   Local anesthetic:  Lidocaine 1% WITH epi Procedure details:    Preparation: Patient was prepped and draped in usual sterile fashion     Needle gauge:  18 G   Ultrasound guidance: no     Approach:  Lateral   Aspirate amount:  70cc   Aspirate characteristics:  Cloudy and yellow   Steroid  injected: no     Specimen collected: yes   Post-procedure details:    Dressing:  Sterile dressing   Procedure completion:  Tolerated   Medications Ordered in ED Medications  lidocaine-EPINEPHrine (XYLOCAINE W/EPI) 2 %-1:100000 (with pres) injection 20 mL (has no administration in time range)    ED Course  I have reviewed the triage vital signs and the nursing notes.  Pertinent labs & imaging results that were available during my care of the patient were reviewed by me and considered in my medical decision making (see chart for details).  Clinical Course as of 11/21/21 2250  Sat Nov 21, 2021  0347 CT Head Wo Contrast Thyroid nodule. Will refer to oncology for follow up.  Oropharynx with a soft tissue mass that I can not visualize. Will also refer this to the oncologist.  [JM]  [phone] DG Knee Complete 4 Views Left No fractures. Has effusion in the bursa  and likely an inflammatory bursitis but with the warmth, erythema and significant pain with ROM, will do arthrocentesis to rule out septic arthritis.  [JM]  0349 Temp: 97.8 F (36.6 C) [JM]  0349 BP: 131/80 [JM]  0349 MAP (mmHg): 94 [JM]  0522 Arthrocentesis performed per procedure note. Hazy yellow fluid retrieved. Approx. 70cc. Likely not infected. Will await labs and d/c and fu later.  [JM]    Clinical Course User Index [JM] Erynn Vaca, Corene Cornea, MD   MDM Rules/Calculators/A&P                          Cbc ok. At the time of finishing this ntoe, her gram stain and prelim culture readings are reassuring. Suspect this was post traumatic inflammation rather than septic. Ace wrap had been applied. She will fu w/ onc for thyroid nodule and oropharynx mass.    Final Clinical Impression(s) / ED Diagnoses Final diagnoses:  Effusion of left knee  Thyroid nodule    Rx / DC Orders ED Discharge Orders     None        Shyheem Whitham, Corene Cornea, MD 11/21/21 2252

## 2021-11-21 NOTE — Discharge Instructions (Addendum)
Please follow-up with your oncologist for the questionable soft tissue mass in the back of your mouth and also the thyroid nodule.  Return here for any new or worsening symptoms.

## 2021-11-23 LAB — GLUCOSE, BODY FLUID OTHER: Glucose, Body Fluid Other: 37 mg/dL

## 2021-11-24 ENCOUNTER — Ambulatory Visit: Payer: Medicare HMO | Admitting: Physician Assistant

## 2021-11-24 LAB — URIC ACID, BODY FLUID: Uric Acid Body Fluid: 4.1 mg/dL

## 2021-11-24 LAB — BODY FLUID CULTURE W GRAM STAIN: Culture: NO GROWTH

## 2021-11-24 LAB — PROTEIN, BODY FLUID (OTHER): Total Protein, Body Fluid Other: 4.2 g/dL

## 2021-11-26 ENCOUNTER — Inpatient Hospital Stay (HOSPITAL_BASED_OUTPATIENT_CLINIC_OR_DEPARTMENT_OTHER): Payer: Medicare HMO | Admitting: Hematology and Oncology

## 2021-11-26 ENCOUNTER — Inpatient Hospital Stay: Payer: Medicare HMO | Attending: Hematology and Oncology

## 2021-11-26 ENCOUNTER — Other Ambulatory Visit: Payer: Self-pay

## 2021-11-26 ENCOUNTER — Ambulatory Visit: Payer: Medicare HMO | Admitting: Internal Medicine

## 2021-11-26 VITALS — BP 136/74 | HR 103 | Temp 99.1°F | Resp 16 | Ht 59.0 in | Wt 112.9 lb

## 2021-11-26 DIAGNOSIS — C8330 Diffuse large B-cell lymphoma, unspecified site: Secondary | ICD-10-CM

## 2021-11-26 DIAGNOSIS — E041 Nontoxic single thyroid nodule: Secondary | ICD-10-CM

## 2021-11-26 DIAGNOSIS — I10 Essential (primary) hypertension: Secondary | ICD-10-CM | POA: Diagnosis not present

## 2021-11-26 DIAGNOSIS — C8331 Diffuse large B-cell lymphoma, lymph nodes of head, face, and neck: Secondary | ICD-10-CM | POA: Insufficient documentation

## 2021-11-26 DIAGNOSIS — I82409 Acute embolism and thrombosis of unspecified deep veins of unspecified lower extremity: Secondary | ICD-10-CM | POA: Insufficient documentation

## 2021-11-26 DIAGNOSIS — M8580 Other specified disorders of bone density and structure, unspecified site: Secondary | ICD-10-CM | POA: Diagnosis not present

## 2021-11-26 DIAGNOSIS — D509 Iron deficiency anemia, unspecified: Secondary | ICD-10-CM | POA: Insufficient documentation

## 2021-11-26 LAB — CMP (CANCER CENTER ONLY)
ALT: 15 U/L (ref 0–44)
AST: 16 U/L (ref 15–41)
Albumin: 3.7 g/dL (ref 3.5–5.0)
Alkaline Phosphatase: 113 U/L (ref 38–126)
Anion gap: 8 (ref 5–15)
BUN: 17 mg/dL (ref 8–23)
CO2: 28 mmol/L (ref 22–32)
Calcium: 8.9 mg/dL (ref 8.9–10.3)
Chloride: 102 mmol/L (ref 98–111)
Creatinine: 0.7 mg/dL (ref 0.44–1.00)
GFR, Estimated: >60 mL/min (ref >60)
Glucose, Bld: 112 mg/dL — ABNORMAL HIGH (ref 70–99)
Potassium: 3.8 mmol/L (ref 3.5–5.1)
Sodium: 138 mmol/L (ref 135–145)
Total Bilirubin: 0.4 mg/dL (ref 0.3–1.2)
Total Protein: 6.9 g/dL (ref 6.5–8.1)

## 2021-11-26 LAB — CBC WITH DIFFERENTIAL (CANCER CENTER ONLY)
Abs Immature Granulocytes: 0.03 10*3/uL (ref 0.00–0.07)
Basophils Absolute: 0 10*3/uL (ref 0.0–0.1)
Basophils Relative: 0 %
Eosinophils Absolute: 0.1 10*3/uL (ref 0.0–0.5)
Eosinophils Relative: 1 %
HCT: 33 % — ABNORMAL LOW (ref 36.0–46.0)
Hemoglobin: 10.2 g/dL — ABNORMAL LOW (ref 12.0–15.0)
Immature Granulocytes: 0 %
Lymphocytes Relative: 17 %
Lymphs Abs: 1.5 10*3/uL (ref 0.7–4.0)
MCH: 22.3 pg — ABNORMAL LOW (ref 26.0–34.0)
MCHC: 30.9 g/dL (ref 30.0–36.0)
MCV: 72.2 fL — ABNORMAL LOW (ref 80.0–100.0)
Monocytes Absolute: 0.8 10*3/uL (ref 0.1–1.0)
Monocytes Relative: 9 %
Neutro Abs: 6.3 10*3/uL (ref 1.7–7.7)
Neutrophils Relative %: 73 %
Platelet Count: 255 10*3/uL (ref 150–400)
RBC: 4.57 MIL/uL (ref 3.87–5.11)
RDW: 15.2 % (ref 11.5–15.5)
WBC Count: 8.7 10*3/uL (ref 4.0–10.5)
nRBC: 0 % (ref 0.0–0.2)

## 2021-11-26 MED ORDER — APIXABAN 5 MG PO TABS: 5.0000 mg | ORAL_TABLET | Freq: Two times a day (BID) | ORAL | 2 refills | Status: DC

## 2021-11-27 ENCOUNTER — Encounter: Payer: Self-pay | Admitting: Hematology and Oncology

## 2021-11-27 NOTE — Progress Notes (Signed)
[facility] CONSULT NOTE  Patient Care Team: Antony Contras, MD as PCP - General (Family Medicine)  CHIEF COMPLAINTS/PURPOSE OF CONSULTATION:  DLBCL of nasopharynx.  ASSESSMENT & PLAN:   GCB subtype, DLBCL of nasopharynx, Stage IV  Diffuse large B-cell lymphoma (HCC) This is a very pleasant 88 year old female patient with diffuse large B-cell lymphoma of the nasopharynx with a metastatic disease noticed on imaging who is here for a follow-up after completing 6 cycles of mini RCHOP.  She had excellent response in all metastatic sites however appears to have residual disease at the nasopharynx on most recent PET/CT. We have discussed that this is residual disease and we will have to continue treatment.  However given her age, mentation and quality of life, I have recommended considering palliative radiation to the nasopharynx followed by repeat imaging and surveillance. She was discussed in [facility], and she was not thought to have any additional disease in oropharynx, according to our radiologist in [facility], She doesn't have disease in oropharynx, its the same disease in her nasopharynx that we are already familiar with.  If she is not a candidate for radiation, we can consider modified dose of Gemzar with rituximab versus Bendamustine and rituximab. If she does proceed with radiation, I will plan to repeat imaging in 3 months, FU in 6 weeks. Discussed with Dr Nevada Crane, he has kindly agreed to edit the report.   Microcytic anemia, Hb is essentially stable. Last ferritin 774 in July. We will continue to monitor. No indication for transfusion  Bilateral lower extremity edema, this is likely unrelated to her disease.  Encouraged to raise her legs and wear compression socks as tolerated.  She does have a DVT for which she is on anticoagulation but I do not believe her DVT has led to significant bilateral lower extremity swelling. Encouraged to stay compliant with her  anticoagulation  Possible orthostasis, encouraged her to take her time when standing up and to hold onto something when she stands up Encouraged hydration and nutrition.  Return to clinic in 2 weeks. No orders of the defined types were placed in this encounter.   HISTORY OF PRESENTING ILLNESS:   [name] 88 y.o. female is here because of new diagnosis of DLBCL of nasopharynx.  [name] is a very pleasant 88 year old female patient who arrived today with her granddaughter [name] to the appointment.  She does not quite remember things as explained and has short-term memory loss.  After a bit of the conversation, she did admit to me that she was told about a cancer in her nose which was biopsied last week but she cannot remember the details.  [name] mentioned that for the past year or so grandma has been having more nasal drainage and has progressively lost weight in the past couple months of at least 20 pounds, has become relatively more sedentary and inactive, has no appetite.  They initially thought she might be having a stroke when she was not getting out of bed for 3days and has not been eating.  When she was in the ER.  She had some imaging CT head without contrast which showed left maxillary and ethmoid sinus disease. She then had MRI brain on the same day which showed 3.2 x 2 x 3.5 cm heterogeneous mass positioned at the left nasopharynx near the fossa of Rosenmuller.  Finding is most concerning for a primary nasopharyngeal neoplasm.  ENT consultation was recommended.  She  then went on to see [name].She had nasal endoscopy with biopsy of left nasopharyngeal mass.  According to [name], polypoid mass lesion was noted to be lymphoma from the posterior aspect of the left middle turbinate.  Pathology showed findings consistent with diffuse large B-cell lymphoma of the left nasopharyngeal mass as well as left middle turbinate.   PET/CT showed hypermetabolic left  nasopharyngeal mass, no associated neck adenopathy, 2 large hypermetabolic liver lesions, destructive lytic metastatic bone lesions.  Since there was no lymphadenopathy and no unusual appearance of spleen, second biopsy was recommended to confirm the diagnosis.  Left ilium  Biopsy showed findings consistent with involvement by non-Hodgkin B-cell lymphoma.  Features are similar to previously known large B-cell lymphoma consistent with involvement by the same disease process.  Cycle day 1 of R mini CHOP on 06/03/2021. C2D1 06/23/2021 Cycle 3-day 1 completed on July 15, 2021 She had an interim PET/CT which showed remarkable response. C4D1 on 08/05/2021 C5D1 on 08/26/2021 C6D1 09/18/2021 PET/CT 10/23/2021 showed persistent hypermetabolic thickening in the posterior left nasopharynx, otherwise interval decrease in size of hepatic lesions without metabolic activity, no metabolic activity in the lytic skeletal lesions.  Wilhelmi segment of mild metabolic activity in the esophagus suggest benign esophagitis.  Interval History  On exam today [name] is accompanied by her son.  Since last visit she had a fall and hit her head and was in the ER.  She had a CT head which commented on an oropharyngeal mass which is reviewed again by our head and neck radiologist and was thought to be the same nasopharyngeal mass hanging into the oropharynx.  Besides this she was also found to have a thyroid nodule and an ultrasound was recommended.  She tells me that she might have stood up too quickly and then passed out.  No other falls.  She hurt her knee, this was evaluated as well.  No evidence of fracture or dislocation.  She has urinary incontinence which is longstanding and has a follow-up with urology.  Rest of the pertinent 10 point ROS reviewed and negative.  MEDICAL HISTORY:  Past Medical History:  Diagnosis Date   Anemia    Arthritis    knees    Cancer ([place])    GERD (gastroesophageal reflux disease)    Hiatal  hernia    History of inguinal hernia repair    BIH   Hyperlipidemia    Hypertension    Memory problem     SURGICAL HISTORY: Past Surgical History:  Procedure Laterality Date   COLONOSCOPY     HERNIA REPAIR  10/13/11   lap BIH rep w/ mesh- Dr. Tora Kindred HERNIA REPAIR  10/13/2011   Procedure: LAPAROSCOPIC INGUINAL HERNIA;  Surgeon: Adin Hector, MD;  Location: [facility];  Service: General;  Laterality: Bilateral;  Laparoscopic bilateral inguinal hernia repair, converted to open bilateral inguinal hernia repairs with mesh   IR IMAGING GUIDED PORT INSERTION  05/06/2021   NASAL ENDOSCOPY Left 04/08/2021   Procedure: NASAL ENDOSCOPY WITH BIOPSY OF LEFT NASOPPHARYNGEAL MASS;  Surgeon: Jason Coop, DO;  Location: [facility];  Service: ENT;  Laterality: Left;  F[place]zen Section    SOCIAL HISTORY: Social History   Socioeconomic History   Marital status: Single    Spouse name: Not on file   Number of children: Not on file   Years of education: Not on file   Highest education level: Not on file  Occupational History   Not on  file  Tobacco Use   Smoking status: Never   Smokeless tobacco: Never  Vaping Use   Vaping Use: Never used  Substance and Sexual Activity   Alcohol use: No   Drug use: No   Sexual activity: Not on file  Other Topics Concern   Not on file  Social History Narrative   Not on file   Social Determinants of Health   Financial Resource Strain: Not on file  Food Insecurity: Not on file  Transportation Needs: Not on file  Physical Activity: Not on file  Stress: Not on file  Social Connections: Not on file  Intimate Partner Violence: Not on file    FAMILY HISTORY: Family History  Problem Relation Age of Onset   Heart disease Mother     ALLERGIES:  has No Known Allergies.  MEDICATIONS:  Current Outpatient Medications  Medication Sig Dispense Refill   acetaminophen (TYLENOL) 500 MG tablet Take 1,000 mg by mouth every 6 (six) hours as needed for  mild pain or fever.     allopurinol (ZYLOPRIM) 300 MG tablet Take 1 tablet (300 mg total) by mouth daily. 30 tablet 0   amLODipine (NORVASC) 10 MG tablet Take 10 mg by mouth daily.     apixaban (ELIQUIS) 5 MG TABS tablet Take 1 tablet (5 mg total) by mouth 2 (two) times daily. 60 tablet 2   atorvastatin (LIPITOR) 80 MG tablet Take 80 mg by mouth every morning.     ezetimibe (ZETIA) 10 MG tablet Take 10 mg by mouth every morning.     memantine (NAMENDA) 10 MG tablet Take 1/2 tablet (5 mg) for 1 week at bedtime, then 1 tablet (10 mg) at bedtime. 27 tablet 0   memantine (NAMENDA) 10 MG tablet Take 1 tablet (10 mg total) by mouth at bedtime. 90 tablet 1   No current facility-administered medications for this visit.    PHYSICAL EXAMINATION:  ECOG PERFORMANCE STATUS: 1/2  Physical Exam Constitutional:      Appearance: Normal appearance.  HENT:     Head: Normocephalic and atraumatic.  Eyes:     Pupils: Pupils are equal, round, and reactive to light.  Cardiovascular:     Rate and Rhythm: Normal rate and regular rhythm.     Pulses: Normal pulses.     Heart sounds: Normal heart sounds.  Pulmonary:     Effort: Pulmonary effort is normal.     Breath sounds: Normal breath sounds.  Abdominal:     General: Abdomen is flat. Bowel sounds are normal.     Palpations: Abdomen is soft.  Musculoskeletal:        General: Swelling (Bilateral lower extremity swelling stable since last visit) and deformity (Left knee swelling likely from recent injury, large effusion was noted on the most recent x-ray.) present. No tenderness.     Cervical back: Normal range of motion and neck supple. No rigidity.  Lymphadenopathy:     Cervical: No cervical adenopathy.  Skin:    General: Skin is warm and dry.  Neurological:     General: No focal deficit present.     Mental Status: She is alert.  Psychiatric:        Mood and Affect: Mood normal.     LABORATORY DATA:  I have reviewed the data as listed Lab  Results  Component Value Date   WBC 8.7 11/26/2021   HGB 10.2 (L) 11/26/2021   HCT 33.0 (L) 11/26/2021   MCV 72.2 (L) 11/26/2021   PLT 255  11/26/2021     Chemistry      Component Value Date/Time   NA 138 11/26/2021 1502   K 3.8 11/26/2021 1502   CL 102 11/26/2021 1502   CO2 28 11/26/2021 1502   BUN 17 11/26/2021 1502   CREATININE 0.70 11/26/2021 1502      Component Value Date/Time   CALCIUM 8.9 11/26/2021 1502   ALKPHOS 113 11/26/2021 1502   AST 16 11/26/2021 1502   ALT 15 11/26/2021 1502   BILITOT 0.4 11/26/2021 1502      RADIOGRAPHIC STUDIES: CT Head Wo Contrast  Result Date: 11/20/2021 CLINICAL DATA:  Head trauma, minor, normal mental status (Age 53-64y); Neck trauma, midline tenderness (Age 24-64y). Fall, head injury. EXAM: CT HEAD WITHOUT CONTRAST CT CERVICAL SPINE WITHOUT CONTRAST TECHNIQUE: Multidetector CT imaging of the head and cervical spine was performed following the standard protocol without intravenous contrast. Multiplanar CT image reconstructions of the cervical spine were also generated. COMPARISON:  None. FINDINGS: CT HEAD FINDINGS Brain: Normal anatomic configuration. Parenchymal volume loss is commensurate with the patient's age. Mild periventricular white matter changes are present likely reflecting the sequela of small vessel ischemia. No abnormal intra or extra-axial mass lesion or fluid collection. No abnormal mass effect or midline shift. No evidence of acute intracranial hemorrhage or infarct. Ventricular size is normal. Cerebellum unremarkable. Vascular: No asymmetric hyperdense vasculature at the skull base. Skull: Intact Sinuses/Orbits: Paranasal sinuses are clear. Orbits are unremarkable. Other: Mastoid air cells and middle ear cavities are clear. CT CERVICAL SPINE FINDINGS Alignment: Overall straightening.  No listhesis. Skull base and vertebrae: Craniocervical alignment is normal. Landau dental interval is not widened. No acute fracture of the cervical  spine. Vertebral body height is preserved. Soft tissues and spinal canal: Posterior disc osteophyte complex in combination with hypertrophy of the lamina propria results in mild central canal stenosis with flattening of the thecal sac at C4-5, C5-6 and C6-7. No canal hematoma. No prevertebral soft tissue swelling or fluid collection. Disc levels: There is diffuse severe intervertebral disc space narrowing and endplate remodeling throughout the cervical spine in keeping with changes of diffuse degenerative disc disease. The prevertebral soft tissues are not thickened on sagittal reformats. Review of the axial images demonstrates multilevel uncovertebral and facet arthrosis resulting in multilevel moderate to severe neuroforaminal narrowing, most severe at C2-3, C3-4, and C4-5. Upper chest: Multinodular thyroid goiter is partially visualized. The visualized upper thorax otherwise unremarkable. Other: There is an asymmetric nodular mass soft tissue mass identified within the posterior oropharynx which appears separate from the uvula and is of unclear etiology on this noncontrast examination. IMPRESSION: No acute intracranial injury.  No calvarial fracture. No acute fracture or listhesis of the cervical spine. Nodular soft tissue mass within the posterior oropharynx. It is unclear whether this represents a primary mass or foreign body within the posterior oropharynx. Correlation with direct visualization is recommended. Incidental heterogeneous and enlarged thyroid. Recommend thyroid US if clinically warranted given patient age. Reference: J Am Coll Radiol. 2015 Feb;12(2): 143-50 Electronically Signed   By: Fidela Salisbury M.D.   On: 11/20/2021 22:36   CT Cervical Spine Wo Contrast  Result Date: 11/20/2021 CLINICAL DATA:  Head trauma, minor, normal mental status (Age 59-64y); Neck trauma, midline tenderness (Age 24-64y). Fall, head injury. EXAM: CT HEAD WITHOUT CONTRAST CT CERVICAL SPINE WITHOUT CONTRAST TECHNIQUE:  Multidetector CT imaging of the head and cervical spine was performed following the standard protocol without intravenous contrast. Multiplanar CT image reconstructions of the cervical spine were  also generated. COMPARISON:  None. FINDINGS: CT HEAD FINDINGS Brain: Normal anatomic configuration. Parenchymal volume loss is commensurate with the patient's age. Mild periventricular white matter changes are present likely reflecting the sequela of small vessel ischemia. No abnormal intra or extra-axial mass lesion or fluid collection. No abnormal mass effect or midline shift. No evidence of acute intracranial hemorrhage or infarct. Ventricular size is normal. Cerebellum unremarkable. Vascular: No asymmetric hyperdense vasculature at the skull base. Skull: Intact Sinuses/Orbits: Paranasal sinuses are clear. Orbits are unremarkable. Other: Mastoid air cells and middle ear cavities are clear. CT CERVICAL SPINE FINDINGS Alignment: Overall straightening.  No listhesis. Skull base and vertebrae: Craniocervical alignment is normal. Landau dental interval is not widened. No acute fracture of the cervical spine. Vertebral body height is preserved. Soft tissues and spinal canal: Posterior disc osteophyte complex in combination with hypertrophy of the lamina propria results in mild central canal stenosis with flattening of the thecal sac at C4-5, C5-6 and C6-7. No canal hematoma. No prevertebral soft tissue swelling or fluid collection. Disc levels: There is diffuse severe intervertebral disc space narrowing and endplate remodeling throughout the cervical spine in keeping with changes of diffuse degenerative disc disease. The prevertebral soft tissues are not thickened on sagittal reformats. Review of the axial images demonstrates multilevel uncovertebral and facet arthrosis resulting in multilevel moderate to severe neuroforaminal narrowing, most severe at C2-3, C3-4, and C4-5. Upper chest: Multinodular thyroid goiter is partially  visualized. The visualized upper thorax otherwise unremarkable. Other: There is an asymmetric nodular mass soft tissue mass identified within the posterior oropharynx which appears separate from the uvula and is of unclear etiology on this noncontrast examination. IMPRESSION: No acute intracranial injury.  No calvarial fracture. No acute fracture or listhesis of the cervical spine. Nodular soft tissue mass within the posterior oropharynx. It is unclear whether this represents a primary mass or foreign body within the posterior oropharynx. Correlation with direct visualization is recommended. Incidental heterogeneous and enlarged thyroid. Recommend thyroid US if clinically warranted given patient age. Reference: J Am Coll Radiol. 2015 Feb;12(2): 143-50 Electronically Signed   By: Fidela Salisbury M.D.   On: 11/20/2021 22:36   DG Knee Complete 4 Views Left  Result Date: 11/20/2021 CLINICAL DATA:  Trauma, fall, pain EXAM: LEFT KNEE - COMPLETE 4+ VIEW COMPARISON:  None. FINDINGS: No recent fracture or dislocation is seen. Osteopenia is seen in bony structures. Degenerative changes are noted with bony spurs in medial, lateral and patellofemoral compartments, more so in the lateral compartment. Osteopenia is seen in the bony structures. There is soft tissue fullness in the suprapatellar bursa. Arterial calcifications are seen in the soft tissues. IMPRESSION: No recent fracture or dislocation is seen. There is moderate to large effusion in the suprapatellar bursa. Osteopenia. Degenerative changes are noted, more severe in the lateral compartment. Electronically Signed   By: Elmer Picker M.D.   On: 11/20/2021 19:58    All questions were answered. The patient knows to call the clinic with any problems, questions or concerns.  I spent 40 minutes in the care of this patient including reviewing plan of care, labs, discussion about follow-up and treatment plan.  We have reviewed her images, discussed about the  lack of oropharyngeal mass.  We have discussed about other options for treatment including palliative radiation versus chemotherapy.  Patient and family were told that this is not standard of care but since she is older and since she is not an excellent candidate for aggressive treatments, this is a  reasonable approach.  She will return to clinic in about 6 weeks and will consider repeat imaging in about 8 weeks to 3 months after her radiation. Benay Pike MD

## 2021-11-30 ENCOUNTER — Encounter: Payer: Self-pay | Admitting: Radiation Oncology

## 2021-11-30 NOTE — Progress Notes (Incomplete)
Radiation Oncology         [phone] ________________________________  Initial Outpatient Consultation  Name: [name] MRN: [id number]  Date: 12/01/2021  DOB: [birth year]  SP:[id number], Shanon Brow, MD  Benay Pike, MD   REFERRING PHYSICIAN: Benay Pike, MD  DIAGNOSIS: No diagnosis found.  Diffuse large B-cell lymphoma of nasopharynx; metastatic  CHIEF COMPLAINT: Here to discuss management of nasopharyngeal cancer  HISTORY OF PRESENT ILLNESS::[name] is a 88 y.o. female who presented to the ED on 03/22/21 with complaints of dizziness. She underwent work-up for suspected stroke, including a CT head and an MRI of her brain. CT head did not reveal any acute findings. MRI of her brain demonstrated a left nasopharyngeal mass lesion measuring approximately 3.2 x 2.0 x 3.5 cm centered near the fossa of Rosenmuller, with no visible extension into the adjacent left parapharyngeal space or erosive changes of the clivus or skull base. Patient did not have any previous MRI imaging for comparison. During evaluation, the patient endorsed a significant loss of appetite, and an approximate 30 pound weight loss over the span of 2 to 3 months, and a sense of disequilibrium for the last several months. Her granddaughter stated that she eats very little. Patient also stated that she does experience some coughing with oral intake, but denied any difficulty swallowing if she chews her food carefully and denied any pain with swallowing.   Subsequently, the patient saw Dr. Fredric Dine on 04/01/21 who performed a nasolaryngoscopy which demonstrated a large mass lesion occupying the left nasopharynx, immediately posterior to the left eustachian tube opening, and noted as concerning for malignant neoplasm. Posterior to the mass lesion, moderate interarytenoid edema was noted as consistent with reflux. The larynx was noted to appear otherwise normal. Subsequently, Dr. Fredric Dine recommended proceeding with nasal  endoscopy with biopsy.   Biopsy of the left nasopharyngeal mass on 04/08/21 revealed findings consistent with diffuse large B-cell lymphoma. Left middle turbinate biopsy also revealed diffuse large B-cell lymphoma.    The patient was then referred to Dr. Chryl Heck on 04/15/21 for further evaluation and to discuss treatment options. During this visit, the patient's granddaughter also noted the patient to have more nasal drainage in the past several months, and noted that the patient became inactive several months ago. Prior to making a decision regarding treatment, the patient's family requested further work-up via imaging which Dr. Chryl Heck accordingly ordered.   PET on 04/28/21 demonstrated the the hypermetabolic left nasopharyngeal mass as consistent with known biopsy proven lymphoma. No associated neck adenopathy was appreciated. PET also demonstrated two large hypermetabolic liver lesions suspicious for metastatic disease, and diffuse destructive lytic metastatic bone lesions involving the axial and appendicular skeleton. Findings were noted as an overall unusual presentation for lymphoma, and raising the question of the lymphoma as a possible second primary. However, no obvious findings suggestive of breast cancer, lung cancer or colon cancer were appreciated, or evidence of lymphadenopathy or splenic involvement.  Subsequently, Dr. Chryl Heck recommended a biopsy of the lytic lesion involving the left ilium. Biopsy of the left ilium on 05/14/21 revealed findings consistent with involvement by non-Hodgkin B-cell lymphoma.  Following bone biopsy, the patient presented to the ED later that day with progressively worsening lower extremity edema and a history of intermittent chest pain for 2 weeks. Labs were clear. CXR showed linear atelectasis in the left lower lobe with trace left pleural effusion. CT of the abdomen and pelvis showed findings similar to recent PET/CT exam, showing multiple hepatic masses and  multiple osseous lytic lesions compatible with metastatic foci of unknown primary. As noted on previous imaging, presenting findings were deemed unusual for lymphoma and percutaneous sampling should be considered for further evaluation. A lytic lesion within the iliac spine wA appreciated as largely comprised of soft tissue, and the multiple hepatic lesions were seen as relatively superficial.   The patient returned to Dr. Chryl Heck on 05/27/21 to discuss treatment options. Following discussion of the risks and benefits, the patient opted to proceed with chemotherapy consisting of Rituxan, Vincristine, Adriamycin, and Cytoxan. First dose on 06/03/21.  Lower venous DVT study performed on 06/05/21 revealed findings consistent with acute deep vein thrombosis involving the right femoral vein. Lower extremity edema was noted to stabilize in following visits with Dr. Chryl Heck.  PET on 07/30/21 showed a notable reduction in size and activity of the left posterior nasopharyngeal mass, as well as reduced density and substantially reduced activity of the scattered osseous lesions and of the liver lesions. The posterior nasopharyngeal mass was noted as still classified as Deauville 5, although substantially reduced in activity; the index hepatic and bony lesions were Deauville 3. A stable enlarged somewhat heterogeneous thyroid gland was also appreciated, noted in the setting of significant comorbidities or limited life expectancy.   During follow up with Dr. Chryl Heck on 08/26/21, the patient was noted to tolerate chemotherapy well other than some bilateral extremity edema and onging urinary incontinence (both not related to chemo).    DXA for bone density on 10/02/21 revealed a T-score of -4.4, classifying the patient as osteoporotic according to [facility] criteria.  Restaging PET on 10/23/21 showed persistent hypermetabolic thickening in the posterior left  nasopharynx (Deauville 4), and an interval decrease in size of the  hepatic lesions without metabolic activity. No metabolic activity was seen associated with the lytic skeletal lesions (Deauville 1). Mynhier segment of mild metabolic activity in the esophagus was also seen, though this was noted as suggestive of benign esophagitis.  Given evidence of residual disease on PET, Dr. Chryl Heck recommended palliative radiation to the nasopharynx followed by repeat imaging and surveillance.  During follow-up visit on 11/05/21, the patient was noted by her family members to have gotten stronger physically and eating better. Dr. Chryl Heck did however note that the patient's mental impairment does not allow her to focus on subjects at hand. Dr. Chryl Heck noted that if the patient is not a candidate for radiation, she would consider a modified dose of Gemzar with rituximab versus Bendamustine and rituximab.  Of note; the patient presented to the ED on 11/20/21 following a fall, and for evaluation of significant swelling and eyrthema to knee. CT of the head and cervical spine performed during ED evaluation demonstrated the lobulated pharyngeal mass corresponding with known nasopharyngeal Large B-cell Lymphoma. No new pharyngeal mass was appreciated, or evidence of intracranial abnormality or injury. Labs performed were negative. Patient's knee swelling was suspected as likely due to post traumatic inflammation.   Swallowing issues, if any: some coughing with oral intake, but denied difficulty swallowing if she chews her food carefully  -Swallow study on 06/26/21 revealed mild dysphagia, posing no limitations on the patient.  Weight Changes: significant loss of appetite and endorsed a 30 pound weight loss over the span of 2 to 3 months (reported on 03/22/21)  Pain status: some pain due to lower extremity edema, otherwise no pain reported related to primary diagnosis  Other symptoms: sense of disequilibrium, dizziness, memory issues, nasal drainage. Patient was noted by Dr. Toya Smothers on  08/26/21  to have a stage 2 pressure ulcer on her buttock; referral to wound clinic placed. On evaluation on 09/18/21, pressure ulcer was noted as stage 3.  -chemo toxicities: bilateral lower extremity swelling and some ongoing urinary incontinence  Tobacco history, if any: never user  ETOH abuse, if any: never user  Prior cancers, if any: none  PREVIOUS RADIATION THERAPY: No  PAST MEDICAL HISTORY:  has a past medical history of Anemia, Arthritis, Cancer ([place]), GERD (gastroesophageal reflux disease), Hiatal hernia, History of inguinal hernia repair, Hyperlipidemia, Hypertension, and Memory problem.    PAST SURGICAL HISTORY: Past Surgical History:  Procedure Laterality Date   COLONOSCOPY     HERNIA REPAIR  10/13/11   lap BIH rep w/ mesh- Dr. Tora Kindred HERNIA REPAIR  10/13/2011   Procedure: LAPAROSCOPIC INGUINAL HERNIA;  Surgeon: Adin Hector, MD;  Location: [facility];  Service: General;  Laterality: Bilateral;  Laparoscopic bilateral inguinal hernia repair, converted to open bilateral inguinal hernia repairs with mesh   IR IMAGING GUIDED PORT INSERTION  05/06/2021   NASAL ENDOSCOPY Left 04/08/2021   Procedure: NASAL ENDOSCOPY WITH BIOPSY OF LEFT NASOPPHARYNGEAL MASS;  Surgeon: Jason Coop, DO;  Location: [place];  Service: ENT;  Laterality: Left;  Frozen Section    FAMILY HISTORY: family history includes Heart disease in her mother.  SOCIAL HISTORY:  reports that she has never smoked. She has never used smokeless tobacco. She reports that she does not drink alcohol and does not use drugs.  ALLERGIES: Patient has no known allergies.  MEDICATIONS:  Current Outpatient Medications  Medication Sig Dispense Refill   acetaminophen (TYLENOL) 500 MG tablet Take 1,000 mg by mouth every 6 (six) hours as needed for mild pain or fever.     allopurinol (ZYLOPRIM) 300 MG tablet Take 1 tablet (300 mg total) by mouth daily. 30 tablet 0   amLODipine (NORVASC) 10 MG tablet Take 10 mg by  mouth daily.     apixaban (ELIQUIS) 5 MG TABS tablet Take 1 tablet (5 mg total) by mouth 2 (two) times daily. 60 tablet 2   atorvastatin (LIPITOR) 80 MG tablet Take 80 mg by mouth every morning.     ezetimibe (ZETIA) 10 MG tablet Take 10 mg by mouth every morning.     memantine (NAMENDA) 10 MG tablet Take 1/2 tablet (5 mg) for 1 week at bedtime, then 1 tablet (10 mg) at bedtime. 27 tablet 0   memantine (NAMENDA) 10 MG tablet Take 1 tablet (10 mg total) by mouth at bedtime. 90 tablet 1   No current facility-administered medications for this encounter.    REVIEW OF SYSTEMS:  Notable for that above.   PHYSICAL EXAM:  vitals were not taken for this visit.   General: Alert and oriented, in no acute distress HEENT: Head is normocephalic. Extraocular movements are intact. Oropharynx is notable for ***. Neck: Neck is notable for *** Heart: Regular in rate and rhythm with no murmurs, rubs, or gallops. Chest: Clear to auscultation bilaterally, with no rhonchi, wheezes, or rales. Abdomen: Soft, nontender, nondistended, with no rigidity or guarding. Extremities: No cyanosis or edema. Lymphatics: see Neck Exam Skin: No concerning lesions. Musculoskeletal: symmetric strength and muscle tone throughout. Neurologic: Cranial nerves II through XII are grossly intact. No obvious focalities. Speech is fluent. Coordination is intact. Psychiatric: Judgment and insight are intact. Affect is appropriate.   ECOG = ***  0 - Asymptomatic (Fully active, able to carry on all predisease activities without restriction)  1 - Symptomatic but completely ambulatory (Restricted in physically strenuous activity but ambulatory and able to carry out work of a light or sedentary nature. For example, light housework, office work)  2 - Symptomatic, <50% in bed during the day (Ambulatory and capable of all self care but unable to carry out any work activities. Up and about more than 50% of waking hours)  3 - Symptomatic,  >50% in bed, but not bedbound (Capable of only limited self-care, confined to bed or chair 50% or more of waking hours)  4 - Bedbound (Completely disabled. Cannot carry on any self-care. Totally confined to bed or chair)  5 - Death   [name] MM, Creech RH, Tormey DC, et al. [phone]). "Toxicity and response criteria of the [facility]". [place] Oncol. 5 (6): 649-55   LABORATORY DATA:  Lab Results  Component Value Date   WBC 8.7 11/26/2021   HGB 10.2 (L) 11/26/2021   HCT 33.0 (L) 11/26/2021   MCV 72.2 (L) 11/26/2021   PLT 255 11/26/2021   CMP     Component Value Date/Time   NA 138 11/26/2021 1502   K 3.8 11/26/2021 1502   CL 102 11/26/2021 1502   CO2 28 11/26/2021 1502   GLUCOSE 112 (H) 11/26/2021 1502   BUN 17 11/26/2021 1502   CREATININE 0.70 11/26/2021 1502   CALCIUM 8.9 11/26/2021 1502   PROT 6.9 11/26/2021 1502   ALBUMIN 3.7 11/26/2021 1502   AST 16 11/26/2021 1502   ALT 15 11/26/2021 1502   ALKPHOS 113 11/26/2021 1502   BILITOT 0.4 11/26/2021 1502   GFRNONAA >60 11/26/2021 1502   GFRAA >90 10/07/2011 1015      Lab Results  Component Value Date   TSH 0.245 (L) 06/30/2021     RADIOGRAPHY: CT Head Wo Contrast  Addendum Date: 11/27/2021   ADDENDUM REPORT: 11/27/2021 08:58 ADDENDUM: Study discussed by telephone with Oncologist Dr. Arletha Pili on 11/27/2021 at 0830 hours. The lobulated pharyngeal mass identified on these images corresponds to the known Nasopharyngeal Large B-cell Lymphoma most recently evaluated on 10/23/2021 PET-CT . There is NO new pharyngeal mass. Electronically Signed   By: Genevie Ann M.D.   On: 11/27/2021 08:58   Result Date: 11/27/2021 CLINICAL DATA:  Head trauma, minor, normal mental status (Age 37-64y); Neck trauma, midline tenderness (Age 28-64y). Fall, head injury. EXAM: CT HEAD WITHOUT CONTRAST CT CERVICAL SPINE WITHOUT CONTRAST TECHNIQUE: Multidetector CT imaging of the head and cervical spine was performed following the standard  protocol without intravenous contrast. Multiplanar CT image reconstructions of the cervical spine were also generated. COMPARISON:  None. FINDINGS: CT HEAD FINDINGS Brain: Normal anatomic configuration. Parenchymal volume loss is commensurate with the patient's age. Mild periventricular white matter changes are present likely reflecting the sequela of small vessel ischemia. No abnormal intra or extra-axial mass lesion or fluid collection. No abnormal mass effect or midline shift. No evidence of acute intracranial hemorrhage or infarct. Ventricular size is normal. Cerebellum unremarkable. Vascular: No asymmetric hyperdense vasculature at the skull base. Skull: Intact Sinuses/Orbits: Paranasal sinuses are clear. Orbits are unremarkable. Other: Mastoid air cells and middle ear cavities are clear. CT CERVICAL SPINE FINDINGS Alignment: Overall straightening.  No listhesis. Skull base and vertebrae: Craniocervical alignment is normal. Landau dental interval is not widened. No acute fracture of the cervical spine. Vertebral body height is preserved. Soft tissues and spinal canal: Posterior disc osteophyte complex in combination with hypertrophy of the lamina propria results in mild central canal stenosis  with flattening of the thecal sac at C4-5, C5-6 and C6-7. No canal hematoma. No prevertebral soft tissue swelling or fluid collection. Disc levels: There is diffuse severe intervertebral disc space narrowing and endplate remodeling throughout the cervical spine in keeping with changes of diffuse degenerative disc disease. The prevertebral soft tissues are not thickened on sagittal reformats. Review of the axial images demonstrates multilevel uncovertebral and facet arthrosis resulting in multilevel moderate to severe neuroforaminal narrowing, most severe at C2-3, C3-4, and C4-5. Upper chest: Multinodular thyroid goiter is partially visualized. The visualized upper thorax otherwise unremarkable. Other: There is an  asymmetric nodular mass soft tissue mass identified within the posterior oropharynx which appears separate from the uvula and is of unclear etiology on this noncontrast examination. IMPRESSION: No acute intracranial injury.  No calvarial fracture. No acute fracture or listhesis of the cervical spine. Nodular soft tissue mass within the posterior oropharynx. It is unclear whether this represents a primary mass or foreign body within the posterior oropharynx. Correlation with direct visualization is recommended. Incidental heterogeneous and enlarged thyroid. Recommend thyroid US if clinically warranted given patient age. Reference: J Am Coll Radiol. 2015 Feb;12(2): 143-50 Electronically Signed: By: Fidela Salisbury M.D. On: 11/20/2021 22:36   CT Cervical Spine Wo Contrast  Addendum Date: 11/27/2021   ADDENDUM REPORT: 11/27/2021 08:58 ADDENDUM: Study discussed by telephone with Oncologist Dr. Arletha Pili on 11/27/2021 at 0830 hours. The lobulated pharyngeal mass identified on these images corresponds to the known Nasopharyngeal Large B-cell Lymphoma most recently evaluated on 10/23/2021 PET-CT . There is NO new pharyngeal mass. Electronically Signed   By: Genevie Ann M.D.   On: 11/27/2021 08:58   Result Date: 11/27/2021 CLINICAL DATA:  Head trauma, minor, normal mental status (Age 64-64y); Neck trauma, midline tenderness (Age 49-64y). Fall, head injury. EXAM: CT HEAD WITHOUT CONTRAST CT CERVICAL SPINE WITHOUT CONTRAST TECHNIQUE: Multidetector CT imaging of the head and cervical spine was performed following the standard protocol without intravenous contrast. Multiplanar CT image reconstructions of the cervical spine were also generated. COMPARISON:  None. FINDINGS: CT HEAD FINDINGS Brain: Normal anatomic configuration. Parenchymal volume loss is commensurate with the patient's age. Mild periventricular white matter changes are present likely reflecting the sequela of small vessel ischemia. No abnormal intra or extra-axial  mass lesion or fluid collection. No abnormal mass effect or midline shift. No evidence of acute intracranial hemorrhage or infarct. Ventricular size is normal. Cerebellum unremarkable. Vascular: No asymmetric hyperdense vasculature at the skull base. Skull: Intact Sinuses/Orbits: Paranasal sinuses are clear. Orbits are unremarkable. Other: Mastoid air cells and middle ear cavities are clear. CT CERVICAL SPINE FINDINGS Alignment: Overall straightening.  No listhesis. Skull base and vertebrae: Craniocervical alignment is normal. Landau dental interval is not widened. No acute fracture of the cervical spine. Vertebral body height is preserved. Soft tissues and spinal canal: Posterior disc osteophyte complex in combination with hypertrophy of the lamina propria results in mild central canal stenosis with flattening of the thecal sac at C4-5, C5-6 and C6-7. No canal hematoma. No prevertebral soft tissue swelling or fluid collection. Disc levels: There is diffuse severe intervertebral disc space narrowing and endplate remodeling throughout the cervical spine in keeping with changes of diffuse degenerative disc disease. The prevertebral soft tissues are not thickened on sagittal reformats. Review of the axial images demonstrates multilevel uncovertebral and facet arthrosis resulting in multilevel moderate to severe neuroforaminal narrowing, most severe at C2-3, C3-4, and C4-5. Upper chest: Multinodular thyroid goiter is partially visualized. The visualized upper thorax otherwise  unremarkable. Other: There is an asymmetric nodular mass soft tissue mass identified within the posterior oropharynx which appears separate from the uvula and is of unclear etiology on this noncontrast examination. IMPRESSION: No acute intracranial injury.  No calvarial fracture. No acute fracture or listhesis of the cervical spine. Nodular soft tissue mass within the posterior oropharynx. It is unclear whether this represents a primary mass or  foreign body within the posterior oropharynx. Correlation with direct visualization is recommended. Incidental heterogeneous and enlarged thyroid. Recommend thyroid US if clinically warranted given patient age. Reference: J Am Coll Radiol. 2015 Feb;12(2): 143-50 Electronically Signed: By: Fidela Salisbury M.D. On: 11/20/2021 22:36   DG Knee Complete 4 Views Left  Result Date: 11/20/2021 CLINICAL DATA:  Trauma, fall, pain EXAM: LEFT KNEE - COMPLETE 4+ VIEW COMPARISON:  None. FINDINGS: No recent fracture or dislocation is seen. Osteopenia is seen in bony structures. Degenerative changes are noted with bony spurs in medial, lateral and patellofemoral compartments, more so in the lateral compartment. Osteopenia is seen in the bony structures. There is soft tissue fullness in the suprapatellar bursa. Arterial calcifications are seen in the soft tissues. IMPRESSION: No recent fracture or dislocation is seen. There is moderate to large effusion in the suprapatellar bursa. Osteopenia. Degenerative changes are noted, more severe in the lateral compartment. Electronically Signed   By: Elmer Picker M.D.   On: 11/20/2021 19:58      IMPRESSION/PLAN:  This is a delightful patient with head and neck cancer. I *** recommend radiotherapy for this patient.  We discussed the potential risks, benefits, and side effects of radiotherapy. We talked in detail about acute and late effects. We discussed that some of the most bothersome acute effects may be mucositis, dysgeusia, salivary changes, skin irritation, hair loss, dehydration, weight loss and fatigue. We talked about late effects which include but are not necessarily limited to dysphagia, hypothyroidism, nerve injury, vascular injury, spinal cord injury, xerostomia, trismus, neck edema, and potential injury to any of the tissues in the head and neck region. No guarantees of treatment were given. A consent form was signed and placed in the patient's medical record.  The patient is enthusiastic about proceeding with treatment. I look forward to participating in the patient's care.    Simulation (treatment planning) will take place ***  We also discussed that the treatment of head and neck cancer is a multidisciplinary process to maximize treatment outcomes and quality of life. For this reason the following referrals have been or will be made:  *** Medical oncology to discuss chemotherapy   *** Dentistry for dental evaluation, possible extractions in the radiation fields, and /or advice on reducing risk of cavities, osteoradionecrosis, or other oral issues.  *** Nutritionist for nutrition support during and after treatment.  *** Speech language pathology for swallowing and/or speech therapy.  *** Social work for social support.   *** Physical therapy due to risk of lymphedema in neck and deconditioning.  *** Baseline labs including TSH.  On date of service, in total, I spent *** minutes on this encounter. Patient was seen in person.  __________________________________________   Eppie Gibson, MD  This document serves as a record of services personally performed by Eppie Gibson, MD. It was created on her behalf by Roney Mans, a trained medical scribe. The creation of this record is based on the scribe's personal observations and the provider's statements to them. This document has been checked and approved by the attending provider.

## 2021-11-30 NOTE — Progress Notes (Incomplete)
Head and Neck Cancer Location of Tumor / Histology:  Diffuse large B-cell lymphoma of nasopharynx  Biopsies revealed:  04/08/2021 FINAL MICROSCOPIC DIAGNOSIS:  A. NASOPHARYNGEAL MASS, LEFT, BIOPSY:       - Findings consistent with diffuse large B-cell lymphoma  B. NASOPHARYNGEAL MASS #2, LEFT, BIOPSY:      -Findings consistent with diffuse large B-cell lymphoma  C. TURBINATE, LEFT MIDDLE, BIOPSY:      -Diffuse large B-cell lymphoma     -See comment  COMMENT:  All specimens show identical features.  There are dense infiltrates in the form of large sheets and variably sized aggregates of primarily atypical large lymphoid appearing cells characterized by vesicular chromatin and small nucleoli associated with brisk mitosis, apoptosis, and necrosis.  The infiltrates are primarily seen underneath the overlying mucosa.  A battery of immunohistochemical stains was performed on part C and shows that the large atypical lymphoid appearing cells are positive for LCA, CD20, CD79 a, PAX5, CD10, BCL6, Bcl-2 and for MUM 1 (variable).  No significant staining is seen with CD34, TdT, CD30 or EBV in situ hybridization.  There is an admixed minor T-cell population in the background as seen with CD3 and CD5 and there is no co-expression of CD5 in B-cell areas.  The findings are consistent with involvement by diffuse large B-cell lymphoma, GCB type  Nutrition Status Yes No Comments  Weight changes? []  []    Swallowing concerns? []  []    PEG? []  []     Referrals Yes No Comments  Social Work? [x]  []    Dentistry? []  [x]    Swallowing therapy? [x]  []    Nutrition? [x]  []    Med/Onc? [x]  []  Dr. Arletha Pili Iruku   Safety Issues Yes No Comments  Prior radiation? []  []    Pacemaker/ICD? []  []    Possible current pregnancy? []  [x]  Postmenopausal  Is the patient on methotrexate? []  []     Tobacco/Marijuana/Snuff/ETOH use: ***Patient has never smoked or used smokeless tobacco. Denies any alcohol or recreational drug  use  Past/Anticipated interventions by otolaryngology, if any:  04/08/2021 --Dr. Ebbie Latus  NASAL ENDOSCOPY WITH BIOPSY OF LEFT NASOPPHARYNGEAL MASS  Past/Anticipated interventions by medical oncology, if any:  Under care of Dr. 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  She had excellent response in all metastatic sites however appears to have residual disease at the nasopharynx on most recent PET/CT. We have discussed that this is residual disease and we will have to continue treatment.   However given her age, mentation and quality of life, I have recommended considering palliative radiation to the nasopharynx followed by repeat imaging and surveillance.  She was discussed in [facility], and she was not thought to have any additional disease in oropharynx, according to our radiologist in [facility], She doesn't have disease in oropharynx, its the same disease in her nasopharynx that we are already familiar with. If she is not a candidate for radiation, we can consider modified dose of Gemzar with rituximab versus Bendamustine and rituximab. If she does proceed with radiation, I will plan to repeat imaging in 3 months, FU in 6 weeks.  Current Complaints / other details:  ***

## 2021-12-01 ENCOUNTER — Ambulatory Visit: Payer: Medicare HMO

## 2021-12-01 ENCOUNTER — Ambulatory Visit
Admission: RE | Admit: 2021-12-01 | Discharge: 2021-12-01 | Disposition: A | Discharge status: Home / Self Care | Payer: Medicare HMO | Source: Ambulatory Visit | Attending: Radiation Oncology | Admitting: Radiation Oncology

## 2021-12-08 ENCOUNTER — Encounter: Payer: Medicare HMO | Attending: Physician Assistant

## 2021-12-08 ENCOUNTER — Other Ambulatory Visit: Payer: Self-pay

## 2021-12-08 DIAGNOSIS — L89152 Pressure ulcer of sacral region, stage 2: Secondary | ICD-10-CM | POA: Diagnosis not present

## 2021-12-08 DIAGNOSIS — Z7901 Long term (current) use of anticoagulants: Secondary | ICD-10-CM | POA: Diagnosis not present

## 2021-12-08 DIAGNOSIS — L0231 Cutaneous abscess of buttock: Secondary | ICD-10-CM | POA: Diagnosis not present

## 2021-12-08 DIAGNOSIS — I10 Essential (primary) hypertension: Secondary | ICD-10-CM | POA: Insufficient documentation

## 2021-12-08 DIAGNOSIS — L98412 Non-pressure chronic ulcer of buttock with fat layer exposed: Secondary | ICD-10-CM | POA: Diagnosis not present

## 2021-12-08 DIAGNOSIS — F0390 Unspecified dementia without behavioral disturbance: Secondary | ICD-10-CM | POA: Diagnosis not present

## 2021-12-09 NOTE — Progress Notes (Signed)
Kosik, Udell I. ([id number]) Visit Report for 12/08/2021 Physician Orders Details Patient Name: [name], [name]. Date of Service: 12/08/2021 1:30 PM Medical Record Number: [id number] Patient Account Number: [id number] Date of Birth/Sex: 02-09-1932 (89 y.o. F) Treating RN: Levora Dredge Primary Care Provider: Antony Contras Other Clinician: Referring Provider: Antony Contras Treating Provider/Extender: Skipper Cliche in Treatment: 9 Verbal / Phone Orders: No Diagnosis Coding Follow-up Appointments o Return Appointment in 2 weeks. Bathing/ Shower/ Hygiene o May shower; gently cleanse wound with antibacterial soap, rinse and pat dry prior to dressing wounds Off-Loading o Turn and reposition every 2 hours - pt educated on importance to reposition every two hours to help with pressure on sacrum. o Other: - pt also educated on importance to change dressing every three days as ordered. Patient states daughter helps with dressing changes. Wound Treatment Wound #1 - Sacrum Wound Laterality: Midline Cleanser: Normal Saline (DME) (Generic) 3 x Per Week/30 Days Discharge Instructions: Wash your hands with soap and water. Remove old dressing, discard into plastic bag and place into trash. Cleanse the wound with Normal Saline prior to applying a clean dressing using gauze sponges, not tissues or cotton balls. Do not scrub or use excessive force. Pat dry using gauze sponges, not tissue or cotton balls. Primary Dressing: Prisma 4.34 (in) (DME) (Generic) 3 x Per Week/30 Days Discharge Instructions: Moisten w/normal saline or sterile water; Cover wound as directed. Do not remove from wound bed. Secondary Dressing: Zetuvit Plus Silicone Border Dressing 4x4 (in/in) (Generic) 3 x Per Week/30 Days Electronic Signature(s) Signed: 12/09/2021 9:12:36 AM By: Levora Dredge Signed: 12/09/2021 4:48:19 PM By: Worthy Keeler PA-C Entered By: Levora Dredge on 12/08/2021 14:11:15 [name], [name].  ([id number]) -------------------------------------------------------------------------------- SuperBill Details Patient Name: [name], [name]. Date of Service: 12/08/2021 Medical Record Number: [id number] Patient Account Number: [id number] Date of Birth/Sex: [birth year] (89 y.o. F) Treating RN: Levora Dredge Primary Care Provider: Antony Contras Other Clinician: Referring Provider: Antony Contras Treating Provider/Extender: Skipper Cliche in Treatment: 9 Diagnosis Coding ICD-10 Codes Code Description L02.31 Cutaneous abscess of buttock L98.412 Non-pressure chronic ulcer of buttock with fat layer exposed I10 Essential (primary) hypertension Z79.01 Heath term (current) use of anticoagulants Facility Procedures CPT4 Code: 26948546 Description: 27035 - WOUND CARE VISIT-LEV 2 EST PT Modifier: Quantity: 1 Electronic Signature(s) Signed: 12/09/2021 9:12:36 AM By: Levora Dredge Signed: 12/09/2021 4:48:19 PM By: Worthy Keeler PA-C Entered By: Levora Dredge on 12/08/2021 14:08:14

## 2021-12-09 NOTE — Progress Notes (Signed)
Hilgers, Arwen I. ([id number]) Visit Report for 12/08/2021 Arrival Information Details Patient Name: [name], [name]. Date of Service: 12/08/2021 1:30 PM Medical Record Number: [id number] Patient Account Number: [id number] Date of Birth/Sex: 1932-10-08 (88 y.o. F) Treating RN: Levora Dredge Primary Care Aeriel Boulay: Antony Contras Other Clinician: Referring Kord Monette: Antony Contras Treating Azazel Franze/Extender: Skipper Cliche in Treatment: 9 Visit Information History Since Last Visit Added or deleted any medications: No Patient Arrived: [name] Any new allergies or adverse reactions: No Arrival Time: 12:58 Had a fall or experienced change in No Accompanied By: son activities of daily living that may affect Transfer Assistance: None risk of falls: Patient Requires Transmission-Based No Hospitalized since last visit: No Precautions: Has Dressing in Place as Prescribed: Yes Patient Has Alerts: Yes Pain Present Now: No Patient Alerts: Patient on Blood Thinner NOT DIABETIC ELIOQUIS Electronic Signature(s) Signed: 12/09/2021 9:12:36 AM By: Levora Dredge Entered By: Levora Dredge on 12/08/2021 13:30:10 Schulenburg, Keltie I. ([id number]) -------------------------------------------------------------------------------- Clinic Level of Care Assessment Details Patient Name: Booze, [name]. Date of Service: 12/08/2021 1:30 PM Medical Record Number: [id number] Patient Account Number: [id number] Date of Birth/Sex: 1932-05-13 (88 y.o. F) Treating RN: Levora Dredge Primary Care Lafawn Lenoir: Antony Contras Other Clinician: Referring Jamille Yoshino: Antony Contras Treating Damarrion Mimbs/Extender: Skipper Cliche in Treatment: 9 Clinic Level of Care Assessment Items TOOL 4 Quantity Score X - Use when only an EandM is performed on FOLLOW-UP visit 1 0 ASSESSMENTS - Nursing Assessment / Reassessment X - Reassessment of Co-morbidities (includes updates in patient status) 1 10 X- 1 5 Reassessment of Adherence to Treatment  Plan ASSESSMENTS - Wound and Skin Assessment / Reassessment X - Simple Wound Assessment / Reassessment - one wound 1 5 []  - 0 Complex Wound Assessment / Reassessment - multiple wounds []  - 0 Dermatologic / Skin Assessment (not related to wound area) ASSESSMENTS - Focused Assessment []  - Circumferential Edema Measurements - multi extremities 0 []  - 0 Nutritional Assessment / Counseling / Intervention []  - 0 Lower Extremity Assessment (monofilament, tuning fork, pulses) []  - 0 Peripheral Arterial Disease Assessment (using hand held doppler) ASSESSMENTS - Ostomy and/or Continence Assessment and Care []  - Incontinence Assessment and Management 0 []  - 0 Ostomy Care Assessment and Management (repouching, etc.) PROCESS - Coordination of Care X - Simple Patient / Family Education for ongoing care 1 15 []  - 0 Complex (extensive) Patient / Family Education for ongoing care []  - 0 Staff obtains Programmer, systems, Records, Test Results / Process Orders []  - 0 Staff telephones HHA, Nursing Homes / Clarify orders / etc []  - 0 Routine Transfer to another Facility (non-emergent condition) []  - 0 Routine Hospital Admission (non-emergent condition) []  - 0 New Admissions / Biomedical engineer / Ordering NPWT, Apligraf, etc. []  - 0 Emergency Hospital Admission (emergent condition) X- 1 10 Simple Discharge Coordination []  - 0 Complex (extensive) Discharge Coordination PROCESS - Special Needs []  - Pediatric / Minor Patient Management 0 []  - 0 Isolation Patient Management []  - 0 Hearing / Language / Visual special needs []  - 0 Assessment of Community assistance (transportation, D/C planning, etc.) []  - 0 Additional assistance / Altered mentation []  - 0 Support Surface(s) Assessment (bed, cushion, seat, etc.) INTERVENTIONS - Wound Cleansing / Measurement Lysaght, [name] ([id number]) X- 1 5 Simple Wound Cleansing - one wound []  - 0 Complex Wound Cleansing - multiple wounds []  - 0 Wound  Imaging (photographs - any number of wounds) []  - 0 Wound Tracing (instead of photographs) []  - 0 Simple Wound Measurement -  one wound []  - 0 Complex Wound Measurement - multiple wounds INTERVENTIONS - Wound Dressings X - Small Wound Dressing one or multiple wounds 1 10 []  - 0 Medium Wound Dressing one or multiple wounds []  - 0 Large Wound Dressing one or multiple wounds []  - 0 Application of Medications - topical []  - 0 Application of Medications - injection INTERVENTIONS - Miscellaneous []  - External ear exam 0 []  - 0 Specimen Collection (cultures, biopsies, blood, body fluids, etc.) []  - 0 Specimen(s) / Culture(s) sent or taken to Lab for analysis []  - 0 Patient Transfer (multiple staff / Civil Service fast streamer / Similar devices) []  - 0 Simple Staple / Suture removal (25 or less) []  - 0 Complex Staple / Suture removal (26 or more) []  - 0 Hypo / Hyperglycemic Management (close monitor of Blood Glucose) []  - 0 Ankle / Brachial Index (ABI) - do not check if billed separately []  - 0 Vital Signs Has the patient been seen at the hospital within the last three years: Yes Total Score: 60 Level Of Care: New/Established - Level 2 Electronic Signature(s) Signed: 12/09/2021 9:12:36 AM By: Levora Dredge Entered By: Levora Dredge on 12/08/2021 14:07:58 Avilla, Genola I. ([id number]) -------------------------------------------------------------------------------- Encounter Discharge Information Details Patient Name: Koffman, Mandee I. Date of Service: 12/08/2021 1:30 PM Medical Record Number: [id number] Patient Account Number: [id number] Date of Birth/Sex: Mar 08, 1932 (88 y.o. F) Treating RN: Levora Dredge Primary Care Gunda Maqueda: Antony Contras Other Clinician: Referring Trinadee Verhagen: Antony Contras Treating Kreed Kauffman/Extender: Skipper Cliche in Treatment: 9 Encounter Discharge Information Items Discharge Condition: Stable Ambulatory Status: Cane Discharge Destination: Home Transportation:  Private Auto Accompanied By: son Schedule Follow-up Appointment: Yes Clinical Summary of Care: Notes son advised to have patient return in one week, as last time patient was seen was 11/10/21 prior to today Electronic Signature(s) Signed: 12/09/2021 9:12:36 AM By: Levora Dredge Entered By: Levora Dredge on 12/08/2021 14:07:07 Pfeifer, Ovella I. ([id number]) -------------------------------------------------------------------------------- Wound Assessment Details Patient Name: Geremia, Analya I. Date of Service: 12/08/2021 1:30 PM Medical Record Number: [id number] Patient Account Number: [id number] Date of Birth/Sex: 1932-11-01 (88 y.o. F) Treating RN: Levora Dredge Primary Care Tammee Thielke: Antony Contras Other Clinician: Referring Neeti Knudtson: Antony Contras Treating Aleyah Balik/Extender: Skipper Cliche in Treatment: 9 Wound Status Wound Number: 1 Primary Abscess Etiology: Wound Location: Midline Sacrum Wound Status: Open Wounding Event: Pressure Injury Comorbid Hypertension, Peripheral Venous Disease, Received Date Acquired: 07/06/2021 History: Chemotherapy Weeks Of Treatment: 9 Clustered Wound: No Wound Measurements Length: (cm) 2.5 Width: (cm) 2 Depth: (cm) 0.2 Area: (cm) 3.927 Volume: (cm) 0.785 % Reduction in Area: 28.6% % Reduction in Volume: 28.6% Epithelialization: None Tunneling: No Undermining: No Wound Description Classification: Full Thickness Without Exposed Support Structures Wound Margin: Flat and Intact Exudate Amount: Medium Exudate Type: Serous Exudate Color: amber Foul Odor After Cleansing: No Slough/Fibrino Yes Wound Bed Granulation Amount: Medium (34-66%) Exposed Structure Granulation Quality: Pink Fascia Exposed: No Necrotic Amount: Medium (34-66%) Fat Layer (Subcutaneous Tissue) Exposed: Yes Necrotic Quality: Adherent Slough Tendon Exposed: No Muscle Exposed: No Joint Exposed: No Bone Exposed: No Assessment Notes pt tolerated dressing change,  advised to come back in a week since last time seen was 11/10/21 Treatment Notes Wound #1 (Sacrum) Wound Laterality: Midline Cleanser Normal Saline Discharge Instruction: Wash your hands with soap and water. Remove old dressing, discard into plastic bag and place into trash. Cleanse the wound with Normal Saline prior to applying a clean dressing using gauze sponges, not tissues or cotton balls. Do not scrub or use excessive force.  Pat dry using gauze sponges, not tissue or cotton balls. Peri-Wound Care Topical Primary Dressing Prisma 4.34 (in) Discharge Instruction: Moisten w/normal saline or sterile water; Cover wound as directed. Do not remove from wound bed. Secondary Dressing Zetuvit Plus Silicone Border Dressing 4x4 (in/in) Secured With Weissman, Amarissa I. ([id number]) Compression Wrap Compression Stockings Add-Ons Electronic Signature(s) Signed: 12/09/2021 9:12:36 AM By: Levora Dredge Entered By: Levora Dredge on 12/08/2021 13:54:07

## 2021-12-10 NOTE — Progress Notes (Signed)
Head and Neck Cancer Location of Tumor / Histology:  Diffuse large B-cell lymphoma of nasopharynx   Biopsies revealed:  04/08/2021 FINAL MICROSCOPIC DIAGNOSIS:  A. NASOPHARYNGEAL MASS, LEFT, BIOPSY:       - Findings consistent with diffuse large B-cell lymphoma  B. NASOPHARYNGEAL MASS #2, LEFT, BIOPSY:      -Findings consistent with diffuse large B-cell lymphoma  C. TURBINATE, LEFT MIDDLE, BIOPSY:      -Diffuse large B-cell lymphoma     -See comment  COMMENT:  All specimens show identical features.  There are dense infiltrates in the form of large sheets and variably sized aggregates of primarily atypical large lymphoid appearing cells characterized by vesicular chromatin and small nucleoli associated with brisk mitosis, apoptosis, and necrosis.  The infiltrates are primarily seen underneath the overlying mucosa.  A battery of immunohistochemical stains was performed on part C and shows that the large atypical lymphoid appearing cells are positive for LCA, CD20, CD79 a, PAX5, CD10, BCL6, Bcl-2 and for MUM 1 (variable).  No significant staining is seen with CD34, TdT, CD30 or EBV in situ hybridization.  There is an admixed minor T-cell population in the background as seen with CD3 and CD5 and there is no co-expression of CD5 in B-cell areas.  The findings are consistent with involvement by diffuse large B-cell lymphoma, GCB type   Nutrition Status Yes No Comments  Weight changes? [x]   []    Son reports minor unintentional weight loss; patient reports a healthy appetite  Swallowing concerns? []   [x]      PEG? []   [x]        Referrals Yes No Comments  Social Work? [x]   []      Dentistry? []   [x]      Swallowing therapy? [x]   []      Nutrition? [x]   []      Med/Onc? [x]   []   Dr. Arletha Pili Iruku    Safety Issues Yes No Comments  Prior radiation? []   [x]      Pacemaker/ICD? []   [x]      Possible current pregnancy? []   [x]   Postmenopausal  Is the patient on methotrexate? []   [x]         Tobacco/Marijuana/Snuff/ETOH use: Patient has never smoked or used smokeless tobacco. Denies any alcohol or recreational drug use   Past/Anticipated interventions by otolaryngology, if any:  04/08/2021 --Dr. Ebbie Latus  NASAL ENDOSCOPY WITH BIOPSY OF LEFT NASOPPHARYNGEAL MASS   Past/Anticipated interventions by medical oncology, if any:  Under care of Dr. 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  She had excellent response in all metastatic sites however appears to have residual disease at the nasopharynx on most recent PET/CT. We have discussed that this is residual disease and we will have to continue treatment.   However given her age, mentation and quality of life, I have recommended considering palliative radiation to the nasopharynx followed by repeat imaging and surveillance.  She was discussed in [facility], and she was not thought to have any additional disease in oropharynx, according to our radiologist in [facility], She doesn't have disease in oropharynx, its the same disease in her nasopharynx that we are already familiar with. If she is not a candidate for radiation, we can consider modified dose of Gemzar with rituximab versus Bendamustine and rituximab. If she does proceed with radiation, I will  plan to repeat imaging in 3 months, FU in 6 weeks.   Current Complaints / other details:  Scheduled for [place] of thyroid on 12/11/2021

## 2021-12-10 NOTE — Progress Notes (Signed)
Radiation Oncology         [phone]) [phone] ________________________________  Initial Outpatient Consultation  Name: [name] MRN: [id number]  Date: 12/11/2021  DOB: 1932/11/06  UQ:[id number], Shanon Brow, MD  Benay Pike, MD   REFERRING PHYSICIAN: Benay Pike, MD  DIAGNOSIS:    ICD-10-CM   1. Diffuse large B-cell lymphoma of lymph nodes of head (HCC)  C83.31     2. Diffuse large B-cell lymphoma of lymph nodes of neck (HCC)  C83.31      Cancer Staging  Diffuse large B-cell lymphoma of lymph nodes of neck (HCC) Staging form: Hodgkin and Non-Hodgkin Lymphoma, AJCC 8th Edition - Clinical stage from 12/11/2021: Stage IV (Diffuse large B-cell lymphoma) - Signed by Eppie Gibson, MD on 12/11/2021 Stage prefix: Initial diagnosis   Diffuse large B-cell lymphoma of nasopharynx; metastatic  CHIEF COMPLAINT: Here to discuss management of nasopharyngeal cancer  HISTORY OF PRESENT ILLNESS::[name] is a 88 y.o. female who presented to the ED on 03/22/21 with complaints of dizziness. She underwent work-up for a suspected stroke, including a head CT and brain MRI. CT of the head did not reveal any acute findings. MRI of her brain demonstrated a left nasopharyngeal mass lesion measuring approximately 3.2 x 2.0 x 3.5 cm centered near the fossa of Rosenmuller, with no visible extension into the adjacent left parapharyngeal space or erosive changes of the clivus or skull base. (She did not have any previous MRI imaging for comparison). During evaluation, the patient endorsed a significant loss of appetite, and an approximate 30 pound weight loss over the span of 2 to 3 months, and a sense of disequilibrium for the last several months. Her granddaughter stated that she eats very little. The patient also stated that she experiences some coughing with oral intake, but denied any difficulty swallowing if she chews her food carefully and denied any pain with swallowing.   Subsequently, the patient saw Dr.  Fredric Dine on 04/01/21 who performed a nasolaryngoscopy which demonstrated a large mass lesion occupying the left nasopharynx, immediately posterior to the left eustachian tube opening, and noted as concerning for malignant neoplasm. Posterior to the mass lesion, moderate interarytenoid edema was noted as consistent with reflux. The larynx was noted to appear otherwise normal. Subsequently, Dr. Fredric Dine recommended proceeding with nasal endoscopy with biopsy.   Biopsy of the left nasopharyngeal mass on 04/08/21 revealed findings consistent with diffuse large B-cell lymphoma. Left middle turbinate biopsy also revealed diffuse large B-cell lymphoma.    The patient was then referred to Dr. Chryl Heck on 04/15/21 for further evaluation and to discuss treatment options. During this visit, the patient's granddaughter noted the patient to have more nasal drainage in the past several months, and reported that the patient became inactive several months ago. Prior to making a decision regarding treatment, the patient's family requested further work-up via imaging which Dr. Chryl Heck accordingly ordered.   PET on 04/28/21 demonstrated the the hypermetabolic left nasopharyngeal mass as consistent with known biopsy proven lymphoma. No associated neck adenopathy was appreciated. PET also demonstrated two large hypermetabolic liver lesions suspicious for metastatic disease, and diffuse destructive lytic metastatic bone lesions involving the axial and appendicular skeleton. Findings were noted as an overall unusual presentation for lymphoma, and raising the question of the lymphoma as a possible second primary. However, no obvious findings suggestive of breast cancer, lung cancer or colon cancer were appreciated, or evidence of lymphadenopathy or splenic involvement.  Subsequently, Dr. Chryl Heck recommended a biopsy of the lytic lesion involving  the left ilium. Biopsy of the left ilium on 05/14/21 revealed findings consistent with  involvement by non-Hodgkin B-cell lymphoma.  Following bone biopsy, the patient presented to the ED later that day with progressively worsening lower extremity edema and a history of intermittent chest pain for 2 weeks. Labs were clear. CXR showed linear atelectasis in the left lower lobe with trace left pleural effusion. CT of the abdomen and pelvis revealed findings similar to recent PET/CT exam; showing multiple hepatic masses and multiple osseous lytic lesions compatible with metastatic foci of unknown primary. As noted on previous imaging, presenting findings were deemed unusual for lymphoma and percutaneous sampling should be considered for further evaluation. CT also showed a lytic lesion within the iliac spine as largely comprised of soft tissue, as well as multiple hepatic lesions that were relatively superficial.   The patient returned to Dr. Chryl Heck on 05/27/21 to discuss treatment options. Following discussion of the risks and benefits, the patient opted to proceed with chemotherapy consisting of Rituxan, Vincristine, Adriamycin, and Cytoxan. First dose on 06/03/21.  Lower venous DVT study performed on 06/05/21 revealed findings consistent with acute deep vein thrombosis involving the right femoral vein. Lower extremity edema was noted to stabilize in following visits with Dr. Chryl Heck.  PET on 07/30/21 showed a notable reduction in size and activity of the left posterior nasopharyngeal mass, as well as reduced density and substantially reduced activity of the scattered osseous lesions and of the liver lesions. The posterior nasopharyngeal mass was noted as still classified as Deauville 5, although substantially reduced in activity; the index hepatic and bony lesions were Deauville 3. A stable enlarged somewhat heterogeneous thyroid gland was also appreciated, noted in the setting of significant comorbidities, or limited life expectancy.   During follow up with Dr. Chryl Heck on 08/26/21, the patient  was noted to tolerate chemotherapy well other than some bilateral extremity edema and onging urinary incontinence (both not related to chemo).    DXA for bone density on 10/02/21 revealed a T-score of -4.4, classifying the patient as osteoporotic according to [facility] criteria.  Restaging PET on 10/23/21 showed persistent hypermetabolic thickening in the posterior left nasopharynx (Deauville 4), and an interval decrease in size of the hepatic lesions without metabolic activity. No metabolic activity was seen associated with the lytic skeletal lesions (Deauville 1). A Puls segment of mild metabolic activity in the esophagus was also seen, though was noted as suggestive of benign esophagitis.  Given evidence of residual disease on PET, Dr. Chryl Heck recommended palliative radiation to the nasopharynx followed by repeat imaging and surveillance.  During follow-up visit on 11/05/21, the patient was noted by her family members to have a healthier appetite, and seem stronger physically. Dr. Chryl Heck did however note that the patient's mental impairment did not allow her to focus on subjects at hand. Dr. Chryl Heck also noted that if the patient is not a candidate for radiation, she would consider a modified dose of Gemzar with Rituximab versus Bendamustine and Rituximab.  Of note; the patient presented to the ED on 11/20/21 following a fall, and for evaluation of significant swelling and eyrthema to her knee. CT of the head and cervical spine performed during ED evaluation demonstrated the lobulated pharyngeal mass corresponding with known nasopharyngeal Large B-cell Lymphoma. No new pharyngeal mass was appreciated, or evidence of intracranial abnormality or injury. Labs performed were negative. Patient's knee swelling was suspected as likely due to post traumatic inflammation.  Swallowing issues, if any: some coughing with oral intake, but  denies difficulty swallowing if she chews her food carefully  -Swallow study on 06/26/21  revealed mild dysphagia, posing no limitations.  Weight Changes: significant loss of appetite and endorsed a 30 pound weight loss over the span of 2 to 3 months (reported on 03/22/21)  Pain status: some pain due to lower extremity edema, otherwise no pain reported related to primary diagnosis  Other symptoms: sense of disequilibrium, dizziness, memory issues, nasal drainage. Patient was noted by Dr. Toya Smothers on 08/26/21 to have a stage 2 pressure ulcer on her buttock; referral to wound clinic placed. Upon evaluation on 09/18/21, pressure ulcer was noted as stage 3.  -chemo toxicities: bilateral lower extremity swelling and some ongoing urinary incontinence  Tobacco history, if any: never user  ETOH abuse, if any: never user  Prior cancers, if any: none  She is here with her son who appears very supportive.  PREVIOUS RADIATION THERAPY: No  PAST MEDICAL HISTORY:  has a past medical history of Anemia, Arthritis, Cancer ([place]), GERD (gastroesophageal reflux disease), Hiatal hernia, History of inguinal hernia repair, Hyperlipidemia, Hypertension, and Memory problem.    PAST SURGICAL HISTORY: Past Surgical History:  Procedure Laterality Date   COLONOSCOPY     HERNIA REPAIR  10/13/11   lap BIH rep w/ mesh- Dr. Tora Kindred HERNIA REPAIR  10/13/2011   Procedure: LAPAROSCOPIC INGUINAL HERNIA;  Surgeon: Adin Hector, MD;  Location: [facility];  Service: General;  Laterality: Bilateral;  Laparoscopic bilateral inguinal hernia repair, converted to open bilateral inguinal hernia repairs with mesh   IR IMAGING GUIDED PORT INSERTION  05/06/2021   NASAL ENDOSCOPY Left 04/08/2021   Procedure: NASAL ENDOSCOPY WITH BIOPSY OF LEFT NASOPPHARYNGEAL MASS;  Surgeon: Jason Coop, DO;  Location: [facility];  Service: ENT;  Laterality: Left;  Frozen Section    FAMILY HISTORY: family history includes Heart disease in her mother.  SOCIAL HISTORY:  reports that she has never smoked. She has never  used smokeless tobacco. She reports that she does not drink alcohol and does not use drugs.  ALLERGIES: Patient has no known allergies.  MEDICATIONS:  Current Outpatient Medications  Medication Sig Dispense Refill   acetaminophen (TYLENOL) 500 MG tablet Take 1,000 mg by mouth every 6 (six) hours as needed for mild pain or fever.     allopurinol (ZYLOPRIM) 300 MG tablet Take 1 tablet (300 mg total) by mouth daily. 30 tablet 0   amLODipine (NORVASC) 10 MG tablet Take 10 mg by mouth daily.     apixaban (ELIQUIS) 5 MG TABS tablet Take 1 tablet (5 mg total) by mouth 2 (two) times daily. 60 tablet 2   atorvastatin (LIPITOR) 80 MG tablet Take 80 mg by mouth every morning.     ezetimibe (ZETIA) 10 MG tablet Take 10 mg by mouth every morning.     LORazepam (ATIVAN) 0.5 MG tablet Take 1 tablet by mouth at bedtime as needed.     memantine (NAMENDA) 10 MG tablet Take 1/2 tablet (5 mg) for 1 week at bedtime, then 1 tablet (10 mg) at bedtime. 27 tablet 0   memantine (NAMENDA) 10 MG tablet Take 1 tablet (10 mg total) by mouth at bedtime. 90 tablet 1   No current facility-administered medications for this encounter.    REVIEW OF SYSTEMS:  Notable for that above.   PHYSICAL EXAM:  height is 4\' 11"  (1.499 m) and weight is 109 lb 2 oz (49.5 kg). Her temporal temperature is 97 F (36.1 C) (abnormal).  Her blood pressure is 123/71 and her pulse is 89. Her respiration is 18 and oxygen saturation is 100%.   General: Alert and oriented, in no acute distress - in WC HEENT: Head is normocephalic.   Oropharynx is notable for no visible lesions. Neck: Neck is notable for no masses Heart: Regular in rate and rhythm with no murmurs, rubs, or gallops. Chest: Clear to auscultation bilaterally, with no rhonchi, wheezes, or rales. Extremities: mild ankle edema Lymphatics: see Neck Exam Skin: No concerning lesions. Psychiatric: Affect is appropriate.  ECOG = 2  0 - Asymptomatic (Fully active, able to carry on all  predisease activities without restriction)  1 - Symptomatic but completely ambulatory (Restricted in physically strenuous activity but ambulatory and able to carry out work of a light or sedentary nature. For example, light housework, office work)  2 - Symptomatic, <50% in bed during the day (Ambulatory and capable of all self care but unable to carry out any work activities. Up and about more than 50% of waking hours)  3 - Symptomatic, >50% in bed, but not bedbound (Capable of only limited self-care, confined to bed or chair 50% or more of waking hours)  4 - Bedbound (Completely disabled. Cannot carry on any self-care. Totally confined to bed or chair)  5 - Death   [name] MM, Creech RH, Tormey DC, et al. [phone]). "Toxicity and response criteria of the [facility] Group". [employer] Oncol. 5 (6): 649-55   LABORATORY DATA:  Lab Results  Component Value Date   WBC 8.7 11/26/2021   HGB 10.2 (L) 11/26/2021   HCT 33.0 (L) 11/26/2021   MCV 72.2 (L) 11/26/2021   PLT 255 11/26/2021   CMP     Component Value Date/Time   NA 138 11/26/2021 1502   K 3.8 11/26/2021 1502   CL 102 11/26/2021 1502   CO2 28 11/26/2021 1502   GLUCOSE 112 (H) 11/26/2021 1502   BUN 17 11/26/2021 1502   CREATININE 0.70 11/26/2021 1502   CALCIUM 8.9 11/26/2021 1502   PROT 6.9 11/26/2021 1502   ALBUMIN 3.7 11/26/2021 1502   AST 16 11/26/2021 1502   ALT 15 11/26/2021 1502   ALKPHOS 113 11/26/2021 1502   BILITOT 0.4 11/26/2021 1502   GFRNONAA >60 11/26/2021 1502   GFRAA >90 10/07/2011 1015      Lab Results  Component Value Date   TSH 0.245 (L) 06/30/2021     RADIOGRAPHY: CT Head Wo Contrast  Addendum Date: 11/27/2021   ADDENDUM REPORT: 11/27/2021 08:58 ADDENDUM: Study discussed by telephone with Oncologist Dr. Arletha Pili on 11/27/2021 at 0830 hours. The lobulated pharyngeal mass identified on these images corresponds to the known Nasopharyngeal Large B-cell Lymphoma most recently evaluated on  10/23/2021 PET-CT . There is NO new pharyngeal mass. Electronically Signed   By: Genevie Ann M.D.   On: 11/27/2021 08:58   Result Date: 11/27/2021 CLINICAL DATA:  Head trauma, minor, normal mental status (Age 32-64y); Neck trauma, midline tenderness (Age 29-64y). Fall, head injury. EXAM: CT HEAD WITHOUT CONTRAST CT CERVICAL SPINE WITHOUT CONTRAST TECHNIQUE: Multidetector CT imaging of the head and cervical spine was performed following the standard protocol without intravenous contrast. Multiplanar CT image reconstructions of the cervical spine were also generated. COMPARISON:  None. FINDINGS: CT HEAD FINDINGS Brain: Normal anatomic configuration. Parenchymal volume loss is commensurate with the patient's age. Mild periventricular white matter changes are present likely reflecting the sequela of small vessel ischemia. No abnormal intra or extra-axial mass lesion or  fluid collection. No abnormal mass effect or midline shift. No evidence of acute intracranial hemorrhage or infarct. Ventricular size is normal. Cerebellum unremarkable. Vascular: No asymmetric hyperdense vasculature at the skull base. Skull: Intact Sinuses/Orbits: Paranasal sinuses are clear. Orbits are unremarkable. Other: Mastoid air cells and middle ear cavities are clear. CT CERVICAL SPINE FINDINGS Alignment: Overall straightening.  No listhesis. Skull base and vertebrae: Craniocervical alignment is normal. Landau dental interval is not widened. No acute fracture of the cervical spine. Vertebral body height is preserved. Soft tissues and spinal canal: Posterior disc osteophyte complex in combination with hypertrophy of the lamina propria results in mild central canal stenosis with flattening of the thecal sac at C4-5, C5-6 and C6-7. No canal hematoma. No prevertebral soft tissue swelling or fluid collection. Disc levels: There is diffuse severe intervertebral disc space narrowing and endplate remodeling throughout the cervical spine in keeping with  changes of diffuse degenerative disc disease. The prevertebral soft tissues are not thickened on sagittal reformats. Review of the axial images demonstrates multilevel uncovertebral and facet arthrosis resulting in multilevel moderate to severe neuroforaminal narrowing, most severe at C2-3, C3-4, and C4-5. Upper chest: Multinodular thyroid goiter is partially visualized. The visualized upper thorax otherwise unremarkable. Other: There is an asymmetric nodular mass soft tissue mass identified within the posterior oropharynx which appears separate from the uvula and is of unclear etiology on this noncontrast examination. IMPRESSION: No acute intracranial injury.  No calvarial fracture. No acute fracture or listhesis of the cervical spine. Nodular soft tissue mass within the posterior oropharynx. It is unclear whether this represents a primary mass or foreign body within the posterior oropharynx. Correlation with direct visualization is recommended. Incidental heterogeneous and enlarged thyroid. Recommend thyroid US if clinically warranted given patient age. Reference: J Am Coll Radiol. 2015 Feb;12(2): 143-50 Electronically Signed: By: Fidela Salisbury M.D. On: 11/20/2021 22:36   CT Cervical Spine Wo Contrast  Addendum Date: 11/27/2021   ADDENDUM REPORT: 11/27/2021 08:58 ADDENDUM: Study discussed by telephone with Oncologist Dr. Arletha Pili on 11/27/2021 at 0830 hours. The lobulated pharyngeal mass identified on these images corresponds to the known Nasopharyngeal Large B-cell Lymphoma most recently evaluated on 10/23/2021 PET-CT . There is NO new pharyngeal mass. Electronically Signed   By: Genevie Ann M.D.   On: 11/27/2021 08:58   Result Date: 11/27/2021 CLINICAL DATA:  Head trauma, minor, normal mental status (Age 67-64y); Neck trauma, midline tenderness (Age 84-64y). Fall, head injury. EXAM: CT HEAD WITHOUT CONTRAST CT CERVICAL SPINE WITHOUT CONTRAST TECHNIQUE: Multidetector CT imaging of the head and cervical spine was  performed following the standard protocol without intravenous contrast. Multiplanar CT image reconstructions of the cervical spine were also generated. COMPARISON:  None. FINDINGS: CT HEAD FINDINGS Brain: Normal anatomic configuration. Parenchymal volume loss is commensurate with the patient's age. Mild periventricular white matter changes are present likely reflecting the sequela of small vessel ischemia. No abnormal intra or extra-axial mass lesion or fluid collection. No abnormal mass effect or midline shift. No evidence of acute intracranial hemorrhage or infarct. Ventricular size is normal. Cerebellum unremarkable. Vascular: No asymmetric hyperdense vasculature at the skull base. Skull: Intact Sinuses/Orbits: Paranasal sinuses are clear. Orbits are unremarkable. Other: Mastoid air cells and middle ear cavities are clear. CT CERVICAL SPINE FINDINGS Alignment: Overall straightening.  No listhesis. Skull base and vertebrae: Craniocervical alignment is normal. Landau dental interval is not widened. No acute fracture of the cervical spine. Vertebral body height is preserved. Soft tissues and spinal canal: Posterior disc osteophyte complex  in combination with hypertrophy of the lamina propria results in mild central canal stenosis with flattening of the thecal sac at C4-5, C5-6 and C6-7. No canal hematoma. No prevertebral soft tissue swelling or fluid collection. Disc levels: There is diffuse severe intervertebral disc space narrowing and endplate remodeling throughout the cervical spine in keeping with changes of diffuse degenerative disc disease. The prevertebral soft tissues are not thickened on sagittal reformats. Review of the axial images demonstrates multilevel uncovertebral and facet arthrosis resulting in multilevel moderate to severe neuroforaminal narrowing, most severe at C2-3, C3-4, and C4-5. Upper chest: Multinodular thyroid goiter is partially visualized. The visualized upper thorax otherwise  unremarkable. Other: There is an asymmetric nodular mass soft tissue mass identified within the posterior oropharynx which appears separate from the uvula and is of unclear etiology on this noncontrast examination. IMPRESSION: No acute intracranial injury.  No calvarial fracture. No acute fracture or listhesis of the cervical spine. Nodular soft tissue mass within the posterior oropharynx. It is unclear whether this represents a primary mass or foreign body within the posterior oropharynx. Correlation with direct visualization is recommended. Incidental heterogeneous and enlarged thyroid. Recommend thyroid US if clinically warranted given patient age. Reference: J Am Coll Radiol. 2015 Feb;12(2): 143-50 Electronically Signed: By: Fidela Salisbury M.D. On: 11/20/2021 22:36   DG Knee Complete 4 Views Left  Result Date: 11/20/2021 CLINICAL DATA:  Trauma, fall, pain EXAM: LEFT KNEE - COMPLETE 4+ VIEW COMPARISON:  None. FINDINGS: No recent fracture or dislocation is seen. Osteopenia is seen in bony structures. Degenerative changes are noted with bony spurs in medial, lateral and patellofemoral compartments, more so in the lateral compartment. Osteopenia is seen in the bony structures. There is soft tissue fullness in the suprapatellar bursa. Arterial calcifications are seen in the soft tissues. IMPRESSION: No recent fracture or dislocation is seen. There is moderate to large effusion in the suprapatellar bursa. Osteopenia. Degenerative changes are noted, more severe in the lateral compartment. Electronically Signed   By: Elmer Picker M.D.   On: 11/20/2021 19:58      IMPRESSION/PLAN:  This is a delightful patient with head and neck cancer - DLBCL of nasopharynx - disease is Stage IV, with excellent response throughout body with exception to nasopharynx. I recommend palliative nasopharyngeal radiotherapy for this patient. I have personally reviewed her imaging and she has been discussed at ENT tumor board with  agreement that RT is in her best interest.  We discussed the potential risks, benefits, and side effects of a 2 week course of radiotherapy. We talked in detail about acute and late effects. We discussed that some of the most bothersome acute effects may be mucositis, dysgeusia, salivary changes, skin irritation, hair loss, fatigue. We talked about late effects which include but are not necessarily limited to  xerostomia and potential injury to any of the tissues in the head and neck region. No guarantees of treatment were given. A consent form was signed and placed in the patient's medical record. The patient is enthusiastic about proceeding with treatment. I look forward to participating in the patient's care.    Simulation (treatment planning) will take place next week, treatment soon thereafter.   On date of service, in total, I spent 45 minutes on this encounter. Patient was seen in person.  __________________________________________   Eppie Gibson, MD  This document serves as a record of services personally performed by Eppie Gibson, MD. It was created on her behalf by Roney Mans, a trained medical scribe. The  creation of this record is based on the scribe's personal observations and the provider's statements to them. This document has been checked and approved by the attending provider.

## 2021-12-11 ENCOUNTER — Encounter: Payer: Self-pay | Admitting: Radiation Oncology

## 2021-12-11 ENCOUNTER — Ambulatory Visit
Admission: RE | Admit: 2021-12-11 | Discharge: 2021-12-11 | Disposition: A | Discharge status: Home / Self Care | Payer: Medicare HMO | Source: Ambulatory Visit | Attending: Radiation Oncology | Admitting: Radiation Oncology

## 2021-12-11 ENCOUNTER — Ambulatory Visit (HOSPITAL_COMMUNITY)
Admission: RE | Admit: 2021-12-11 | Discharge: 2021-12-11 | Disposition: A | Discharge status: Home / Self Care | Payer: Medicare HMO | Source: Ambulatory Visit | Attending: Hematology and Oncology | Admitting: Hematology and Oncology

## 2021-12-11 ENCOUNTER — Other Ambulatory Visit: Payer: Self-pay

## 2021-12-11 VITALS — BP 123/71 | HR 89 | Temp 97.0°F | Resp 18 | Ht 59.0 in | Wt 109.1 lb

## 2021-12-11 DIAGNOSIS — C8331 Diffuse large B-cell lymphoma, lymph nodes of head, face, and neck: Secondary | ICD-10-CM | POA: Insufficient documentation

## 2021-12-11 DIAGNOSIS — E041 Nontoxic single thyroid nodule: Secondary | ICD-10-CM | POA: Insufficient documentation

## 2021-12-11 NOTE — Progress Notes (Signed)
Oncology Nurse Navigator Documentation   Met with patient during initial consult with Dr. Isidore Moos. She was accompanied by her son, Jenny Reichmann. Further introduced myself as her/their Navigator, explained my role as a member of the Care Team. Assisted with post-consult appt scheduling. They verbalized understanding of information provided. I encouraged them to call with questions/concerns moving forward.  Harlow Asa, RN, BSN, OCN Head & Neck Oncology Nurse [facility] [place] [place] [phone]

## 2021-12-13 ENCOUNTER — Other Ambulatory Visit: Payer: Self-pay | Admitting: Hematology and Oncology

## 2021-12-13 DIAGNOSIS — E041 Nontoxic single thyroid nodule: Secondary | ICD-10-CM

## 2021-12-15 ENCOUNTER — Ambulatory Visit
Admission: RE | Admit: 2021-12-15 | Discharge: 2021-12-15 | Disposition: A | Discharge status: Home / Self Care | Payer: Medicare HMO | Source: Ambulatory Visit | Attending: Radiation Oncology | Admitting: Radiation Oncology

## 2021-12-15 ENCOUNTER — Other Ambulatory Visit: Payer: Self-pay

## 2021-12-15 ENCOUNTER — Encounter: Payer: Medicare HMO | Admitting: Physician Assistant

## 2021-12-15 DIAGNOSIS — I10 Essential (primary) hypertension: Secondary | ICD-10-CM | POA: Diagnosis not present

## 2021-12-15 DIAGNOSIS — F0390 Unspecified dementia without behavioral disturbance: Secondary | ICD-10-CM | POA: Diagnosis not present

## 2021-12-15 DIAGNOSIS — C8331 Diffuse large B-cell lymphoma, lymph nodes of head, face, and neck: Secondary | ICD-10-CM | POA: Insufficient documentation

## 2021-12-15 DIAGNOSIS — L89152 Pressure ulcer of sacral region, stage 2: Secondary | ICD-10-CM | POA: Diagnosis not present

## 2021-12-15 DIAGNOSIS — Z510 Encounter for antineoplastic radiation therapy: Secondary | ICD-10-CM | POA: Diagnosis not present

## 2021-12-15 DIAGNOSIS — L0231 Cutaneous abscess of buttock: Secondary | ICD-10-CM | POA: Diagnosis not present

## 2021-12-15 DIAGNOSIS — L98412 Non-pressure chronic ulcer of buttock with fat layer exposed: Secondary | ICD-10-CM | POA: Diagnosis not present

## 2021-12-15 DIAGNOSIS — Z7901 Long term (current) use of anticoagulants: Secondary | ICD-10-CM | POA: Diagnosis not present

## 2021-12-15 NOTE — Progress Notes (Addendum)
Wist, Courtne I. ([id number]) Visit Report for 12/15/2021 Chief Complaint Document Details Patient Name: [name], [name]. Date of Service: 12/15/2021 2:00 PM Medical Record Number: [id number] Patient Account Number: [id number] Date of Birth/Sex: 1932-10-21 (89 y.o. F) Treating RN: Cornell Barman Primary Care Provider: Antony Contras Other Clinician: Referring Provider: Antony Contras Treating Provider/Extender: Skipper Cliche in Treatment: 10 Information Obtained from: Patient Chief Complaint Sacral pressure ulcer Electronic Signature(s) Signed: 12/15/2021 2:31:34 PM By: Worthy Keeler PA-C Entered By: Worthy Keeler on 12/15/2021 14:31:34 [name], [name]. ([id number]) -------------------------------------------------------------------------------- Debridement Details Patient Name: [name], [name]. Date of Service: 12/15/2021 2:00 PM Medical Record Number: [id number] Patient Account Number: [id number] Date of Birth/Sex: 03/13/1932 (89 y.o. F) Treating RN: Cornell Barman Primary Care Provider: Antony Contras Other Clinician: Referring Provider: Antony Contras Treating Provider/Extender: Skipper Cliche in Treatment: 10 Debridement Performed for Wound #2 Proximal,Midline Sacrum Assessment: Performed By: Physician Tommie Sams., PA-C Debridement Type: Chemical/Enzymatic/Mechanical Agent Used: saline and gauze Level of Consciousness (Pre- Awake and Alert procedure): Pre-procedure Verification/Time Out Yes - 15:00 Taken: Start Time: 15:00 Instrument: Other : saline and gauze Bleeding: Minimum Hemostasis Achieved: Pressure Response to Treatment: Procedure was tolerated well Level of Consciousness (Post- Awake and Alert procedure): Post Debridement Measurements of Total Wound Length: (cm) 0.5 Stage: Category/Stage II Width: (cm) 0.6 Depth: (cm) 0.1 Volume: (cm) 0.024 Character of Wound/Ulcer Post Debridement: Stable Post Procedure Diagnosis Same as Pre-procedure Electronic  Signature(s) Signed: 12/15/2021 5:12:14 PM By: Gretta Cool, BSN, RN, CWS, Kim RN, BSN Signed: 12/15/2021 6:21:10 PM By: Worthy Keeler PA-C Entered By: Gretta Cool, BSN, RN, CWS, Kim on 12/15/2021 15:20:43 Siegel, Jaedan I. ([id number]) -------------------------------------------------------------------------------- Debridement Details Patient Name: [name], [name]. Date of Service: 12/15/2021 2:00 PM Medical Record Number: [id number] Patient Account Number: [id number] Date of Birth/Sex: April 25, 1932 (89 y.o. F) Treating RN: Cornell Barman Primary Care Provider: Antony Contras Other Clinician: Referring Provider: Antony Contras Treating Provider/Extender: Skipper Cliche in Treatment: 10 Debridement Performed for Wound #1 Midline Sacrum Assessment: Performed By: Physician Tommie Sams., PA-C Debridement Type: Chemical/Enzymatic/Mechanical Agent Used: saline and gauze Level of Consciousness (Pre- Awake and Alert procedure): Pre-procedure Verification/Time Out Yes - 14:00 Taken: Start Time: 15:00 Instrument: Other : saline and gauze Bleeding: Minimum Hemostasis Achieved: Pressure Response to Treatment: Procedure was tolerated well Level of Consciousness (Post- Awake and Alert procedure): Post Debridement Measurements of Total Wound Length: (cm) 1.5 Width: (cm) 0.4 Depth: (cm) 0.3 Volume: (cm) 0.141 Character of Wound/Ulcer Post Debridement: Stable Post Procedure Diagnosis Same as Pre-procedure Electronic Signature(s) Signed: 12/15/2021 4:45:40 PM By: Gretta Cool, BSN, RN, CWS, Kim RN, BSN Signed: 12/15/2021 6:21:10 PM By: Worthy Keeler PA-C Entered By: Gretta Cool, BSN, RN, CWS, Kim on 12/15/2021 16:45:40 [name], [name]. ([id number]) -------------------------------------------------------------------------------- HPI Details Patient Name: [name], [name]. Date of Service: 12/15/2021 2:00 PM Medical Record Number: [id number] Patient Account Number: [id number] Date of Birth/Sex: [birth year] (89 y.o. F) Treating  RN: Cornell Barman Primary Care Provider: Antony Contras Other Clinician: Referring Provider: Antony Contras Treating Provider/Extender: Skipper Cliche in Treatment: 10 History of Present Illness HPI Description: 10/05/2021 patient presents today for a wound that actually began on around July 06, 2021. This appears initially to have been she tells me a blister that came up and then subsequently open. My concern here is that this was probably more of an abscess than a pressure ulcer she is actually a fairly active individual she does not lay in bed all the time she does ambulate and again I think  pressure is a much less likely because of this to be perfectly honest that an abscess. Nonetheless I see no signs of infection right now she does have some slough buildup on the surface of the wound but nothing too significant which is great news. No fevers, chills, nausea, vomiting, or diarrhea. The patient does have a history otherwise of hypertension, Eggenberger-[name] use of anticoagulant therapy, and she does have some dementia. 10/19/2021 upon evaluation today patient's wound showed some signs of improvement though she continues to have some issues as well with some slough buildup this can require little bit of sharp debridement. She did not have a dressing on today but she had just recently taken a shower. Obviously that is okay but we do want to try to keep this covered is much as possible to help keep it from getting too wet. She is also has been having unfortunately some issues with urinary symptoms frequent urination being the main thing here. Again I do believe that still of utmost concern for her to get checked out if she has a UTI that needs to be addressed sooner rather than later. 11/10/2021 upon evaluation today patient appears to be doing well with regard to her wound from the standpoint of there being no infection unfortunately she does have a lot of moisture and this really is not being changed  as far as the dressing is concerned. We have been using collagen but nobody is really been changing the dressings regularly as directed. Nonetheless her niece who is with her today says that she is can work on getting that done appropriately. 12/15/2021 upon evaluation today patient appears to be doing somewhat better in regard to the wound in the sacral region. Fortunately there does not appear to be any evidence of active infection locally nor systemically at this point she has a small pressure injury just above and to the left of the original wound this is very superficial but nonetheless present. Electronic Signature(s) Signed: 12/15/2021 5:52:17 PM By: Worthy Keeler PA-C Entered By: Worthy Keeler on 12/15/2021 17:52:17 [name], [name]. ([id number]) -------------------------------------------------------------------------------- Physical Exam Details Patient Name: [name], [name]. Date of Service: 12/15/2021 2:00 PM Medical Record Number: [id number] Patient Account Number: [id number] Date of Birth/Sex: 1932/10/29 (89 y.o. F) Treating RN: Cornell Barman Primary Care Provider: Antony Contras Other Clinician: Referring Provider: Antony Contras Treating Provider/Extender: Skipper Cliche in Treatment: 76 Constitutional Well-nourished and well-hydrated in no acute distress. Respiratory normal breathing without difficulty. Psychiatric Patient is not able to cooperate in decision making regarding care. Patient is oriented to person only. pleasant and cooperative. Notes Patient's wound that showed signs of good granulation and epithelization at this point. There was good tissue noted in the base of the wound and overall I do not see any signs of active infection locally nor systemically at this time. Overall I think that she is doing well as far as that is concerned the biggest thing that I see is that the patient is going require aggressive monitoring of the new area to the 10:00 location compared  to the original wound in the sacral area where she has a bony prominence that is threatening to open up bigger right now is very superficial I am hoping we can keep this from becoming a problem. Electronic Signature(s) Signed: 12/15/2021 5:53:09 PM By: Worthy Keeler PA-C Entered By: Worthy Keeler on 12/15/2021 17:53:09 [name], [name]. ([id number]) -------------------------------------------------------------------------------- Physician Orders Details Patient Name: Bordeau, Kaylei I. Date of Service: 12/15/2021  2:00 PM Medical Record Number: [id number] Patient Account Number: [id number] Date of Birth/Sex: [birth year] (89 y.o. F) Treating RN: Cornell Barman Primary Care Provider: Antony Contras Other Clinician: Referring Provider: Antony Contras Treating Provider/Extender: Skipper Cliche in Treatment: 10 Verbal / Phone Orders: No Diagnosis Coding ICD-10 Coding Code Description L02.31 Cutaneous abscess of buttock L98.412 Non-pressure chronic ulcer of buttock with fat layer exposed I10 Essential (primary) hypertension Z79.01 [place] [name] (current) use of anticoagulants F01.50 Vascular dementia without behavioral disturbance Follow-up Appointments o Return Appointment in 2 weeks. o Other: - Call the office with any concerns [phone] Bathing/ Shower/ Hygiene o May shower; gently cleanse wound with antibacterial soap, rinse and pat dry prior to dressing wounds Off-Loading o Turn and reposition every 2 hours - pt educated on importance to reposition every two hours to help with pressure on sacrum. Cushion for chair to be ordered [place] o Other: - pt also educated on importance to change dressing every three days as ordered. Patient states daughter helps with dressing changes. Wound Treatment Wound #1 - Sacrum Wound Laterality: Midline Cleanser: Normal Saline (DME) (Generic) 3 x Per Week/30 Days Discharge Instructions: Wash your hands with soap and water. Remove old  dressing, discard into plastic bag and place into trash. Cleanse the wound with Normal Saline prior to applying a clean dressing using gauze sponges, not tissues or cotton balls. Do not scrub or use excessive force. Pat dry using gauze sponges, not tissue or cotton balls. Primary Dressing: Prisma 4.34 (in) (DME) (Generic) 3 x Per Week/30 Days Discharge Instructions: Moisten w/normal saline or sterile water; Cover wound as directed. Do not remove from wound bed. Secondary Dressing: Mepilex Border Flex, 4x4 (in/in) (DME) (Generic) 3 x Per Week/30 Days Discharge Instructions: Apply to wound as directed. Do not cut. Electronic Signature(s) Signed: 12/15/2021 5:12:14 PM By: Gretta Cool, BSN, RN, CWS, Kim RN, BSN Signed: 12/15/2021 6:21:10 PM By: Worthy Keeler PA-C Entered By: Gretta Cool BSN, RN, CWS, Kim on 12/15/2021 17:07:43 Eschmann, Deerica I. ([id number]) -------------------------------------------------------------------------------- Problem List Details Patient Name: Pund, Esmee I. Date of Service: 12/15/2021 2:00 PM Medical Record Number: [id number] Patient Account Number: [id number] Date of Birth/Sex: September 16, 1932 (89 y.o. F) Treating RN: Cornell Barman Primary Care Provider: Antony Contras Other Clinician: Referring Provider: Antony Contras Treating Provider/Extender: Skipper Cliche in Treatment: 10 Active Problems ICD-10 Encounter Code Description Active Date MDM Diagnosis L02.31 Cutaneous abscess of buttock 10/05/2021 No Yes L98.412 Non-pressure chronic ulcer of buttock with fat layer exposed 10/05/2021 No Yes L89.152 Pressure ulcer of sacral region, stage 2 12/15/2021 No Yes I10 Essential (primary) hypertension 10/05/2021 No Yes Z79.01 [place] [name] (current) use of anticoagulants 10/05/2021 No Yes F01.50 Vascular dementia without behavioral disturbance 10/05/2021 No Yes Inactive Problems Resolved Problems Electronic Signature(s) Signed: 12/15/2021 4:53:26 PM By: Worthy Keeler PA-C Previous Signature:  12/15/2021 2:31:21 PM Version By: Worthy Keeler PA-C Entered By: Worthy Keeler on 12/15/2021 16:53:25 [name], [name]. ([id number]) -------------------------------------------------------------------------------- Progress Note Details Patient Name: [name], [name]. Date of Service: 12/15/2021 2:00 PM Medical Record Number: [id number] Patient Account Number: [id number] Date of Birth/Sex: Dec 04, 1931 (89 y.o. F) Treating RN: Cornell Barman Primary Care Provider: Antony Contras Other Clinician: Referring Provider: Antony Contras Treating Provider/Extender: Skipper Cliche in Treatment: 10 Subjective Chief Complaint Information obtained from Patient Sacral pressure ulcer History of Present Illness (HPI) 10/05/2021 patient presents today for a wound that actually began on around July 06, 2021. This appears initially to have been she tells me a blister  that came up and then subsequently open. My concern here is that this was probably more of an abscess than a pressure ulcer she is actually a fairly active individual she does not lay in bed all the time she does ambulate and again I think pressure is a much less likely because of this to be perfectly honest that an abscess. Nonetheless I see no signs of infection right now she does have some slough buildup on the surface of the wound but nothing too significant which is great news. No fevers, chills, nausea, vomiting, or diarrhea. The patient does have a history otherwise of hypertension, Hauss-[name] use of anticoagulant therapy, and she does have some dementia. 10/19/2021 upon evaluation today patient's wound showed some signs of improvement though she continues to have some issues as well with some slough buildup this can require little bit of sharp debridement. She did not have a dressing on today but she had just recently taken a shower. Obviously that is okay but we do want to try to keep this covered is much as possible to help keep it from getting too  wet. She is also has been having unfortunately some issues with urinary symptoms frequent urination being the main thing here. Again I do believe that still of utmost concern for her to get checked out if she has a UTI that needs to be addressed sooner rather than later. 11/10/2021 upon evaluation today patient appears to be doing well with regard to her wound from the standpoint of there being no infection unfortunately she does have a lot of moisture and this really is not being changed as far as the dressing is concerned. We have been using collagen but nobody is really been changing the dressings regularly as directed. Nonetheless her niece who is with her today says that she is can work on getting that done appropriately. 12/15/2021 upon evaluation today patient appears to be doing somewhat better in regard to the wound in the sacral region. Fortunately there does not appear to be any evidence of active infection locally nor systemically at this point she has a small pressure injury just above and to the left of the original wound this is very superficial but nonetheless present. Objective Constitutional Well-nourished and well-hydrated in no acute distress. Vitals Time Taken: 2:26 PM, Height: 60 in, Weight: 116 lbs, BMI: 22.7, Temperature: 98.8 F, Pulse: 101 bpm, Respiratory Rate: 16 breaths/min, Blood Pressure: 143/79 mmHg. Respiratory normal breathing without difficulty. Psychiatric Patient is not able to cooperate in decision making regarding care. Patient is oriented to person only. pleasant and cooperative. General Notes: Patient's wound that showed signs of good granulation and epithelization at this point. There was good tissue noted in the base of the wound and overall I do not see any signs of active infection locally nor systemically at this time. Overall I think that she is doing well as far as that is concerned the biggest thing that I see is that the patient is going require  aggressive monitoring of the new area to the 10:00 location compared to the original wound in the sacral area where she has a bony prominence that is threatening to open up bigger right now is very superficial I am hoping we can keep this from becoming a problem. Integumentary (Hair, Skin) Wound #1 status is Open. Original cause of wound was Pressure Injury. The date acquired was: 07/06/2021. The wound has been in treatment 10 weeks. The wound is located on the Midline Sacrum. The  wound measures 1.5cm length x 0.4cm width x 0.3cm depth; 0.471cm^2 area and 0.141cm^3 volume. There is Fat Layer (Subcutaneous Tissue) exposed. There is no tunneling noted, however, there is undermining starting at 12:00 and ending at 12:00 with a maximum distance of 0.5cm. There is a medium amount of serous drainage noted. The wound margin is flat Bonsignore, Deziree I. ([id number]) and intact. There is large (67-100%) pink granulation within the wound bed. There is a small (1-33%) amount of necrotic tissue within the wound bed including Adherent Slough. General Notes: Macerated around edges Wound #2 status is Open. Original cause of wound was Pressure Injury. The date acquired was: 12/07/2021. The wound is located on the Proximal,Midline Sacrum. The wound measures 0.5cm length x 0.6cm width x 0.1cm depth; 0.236cm^2 area and 0.024cm^3 volume. There is Fat Layer (Subcutaneous Tissue) exposed. There is a medium amount of serous drainage noted. The wound margin is flat and intact. There is medium (34-66%) pink granulation within the wound bed. There is a medium (34-66%) amount of necrotic tissue within the wound bed including Adherent Slough. Assessment Active Problems ICD-10 Cutaneous abscess of buttock Non-pressure chronic ulcer of buttock with fat layer exposed Pressure ulcer of sacral region, stage 2 Essential (primary) hypertension [name] [name] (current) use of anticoagulants Vascular dementia without behavioral  disturbance Procedures Wound #1 Pre-procedure diagnosis of Wound #1 is an Abscess located on the Midline Sacrum . There was a Chemical/Enzymatic/Mechanical debridement performed by Tommie Sams., PA-C. With the following instrument(s): saline and gauze. Other agent used was saline and gauze. A time out was conducted at 14:00, prior to the start of the procedure. A Minimum amount of bleeding was controlled with Pressure. The procedure was tolerated well. Post Debridement Measurements: 1.5cm length x 0.4cm width x 0.3cm depth; 0.141cm^3 volume. Character of Wound/Ulcer Post Debridement is stable. Post procedure Diagnosis Wound #1: Same as Pre-Procedure Wound #2 Pre-procedure diagnosis of Wound #2 is a Pressure Ulcer located on the Proximal,Midline Sacrum . There was a Chemical/Enzymatic/Mechanical debridement performed by Tommie Sams., PA-C. With the following instrument(s): saline and gauze. Other agent used was saline and gauze. A time out was conducted at 15:00, prior to the start of the procedure. A Minimum amount of bleeding was controlled with Pressure. The procedure was tolerated well. Post Debridement Measurements: 0.5cm length x 0.6cm width x 0.1cm depth; 0.024cm^3 volume. Post debridement Stage noted as Category/Stage II. Character of Wound/Ulcer Post Debridement is stable. Post procedure Diagnosis Wound #2: Same as Pre-Procedure Plan Follow-up Appointments: Return Appointment in 2 weeks. Other: - Call the office with any concerns [phone] Bathing/ Shower/ Hygiene: May shower; gently cleanse wound with antibacterial soap, rinse and pat dry prior to dressing wounds Off-Loading: Turn and reposition every 2 hours - pt educated on importance to reposition every two hours to help with pressure on sacrum. Cushion for chair to be ordered by family [place] Other: - pt also educated on importance to change dressing every three days as ordered. Patient states daughter helps with  dressing changes. WOUND #1: - Sacrum Wound Laterality: Midline Cleanser: Normal Saline (DME) (Generic) 3 x Per Week/30 Days Discharge Instructions: Wash your hands with soap and water. Remove old dressing, discard into plastic bag and place into trash. Cleanse the wound with Normal Saline prior to applying a clean dressing using gauze sponges, not tissues or cotton balls. Do not scrub or use excessive force. Pat dry using gauze sponges, not tissue or cotton balls. Primary Dressing: Prisma 4.34 (in) (DME) (Generic)  3 x Per Week/30 Days Discharge Instructions: Moisten w/normal saline or sterile water; Cover wound as directed. Do not remove from wound bed. Secondary Dressing: Mepilex Border Flex, 4x4 (in/in) (DME) (Generic) 3 x Per Week/30 Days Discharge Instructions: Apply to wound as directed. Do not cut. Swanger, Laurren I. ([id number]) 1. Would recommend currently that we continue with the wound care measures as before and this includes the use of the silver collagen dressing which I think is doing a good job for the sacral region. 2. I am also can recommend that we have the patient continue with the border foam dressing to cover which I think is also been of benefit. 3. I would also recommend that the patient continue with appropriate offloading to keep pressure off of the sacral region which I think is going to continue to be an ongoing concern. We will see patient back for reevaluation in 2 weeks here in the clinic. If anything worsens or changes patient will contact our office for additional recommendations. Electronic Signature(s) Signed: 12/15/2021 5:54:07 PM By: Worthy Keeler PA-C Entered By: Worthy Keeler on 12/15/2021 17:54:07 Kleiber, Caroleen I. ([id number]) -------------------------------------------------------------------------------- SuperBill Details Patient Name: Mannings, Sakeena I. Date of Service: 12/15/2021 Medical Record Number: [id number] Patient Account Number: [id number] Date of  Birth/Sex: 1932/01/17 (89 y.o. F) Treating RN: Cornell Barman Primary Care Provider: Antony Contras Other Clinician: Referring Provider: Antony Contras Treating Provider/Extender: Skipper Cliche in Treatment: 10 Diagnosis Coding ICD-10 Codes Code Description L02.31 Cutaneous abscess of buttock L98.412 Non-pressure chronic ulcer of buttock with fat layer exposed I10 Essential (primary) hypertension Z79.01 [place] [name] (current) use of anticoagulants F01.C0 Vascular dementia, severe, without behavioral disturbance, psychotic disturbance, mood disturbance, and anxiety Facility Procedures CPT4 Code: 85027741 Description: 99213 - WOUND CARE VISIT-LEV 3 EST PT Modifier: Quantity: 1 Physician Procedures CPT4 Code: 2878676 Description: 72094 - WC PHYS LEVEL 4 - EST PT Modifier: Quantity: 1 CPT4 Code: Description: ICD-10 Diagnosis Description L02.31 Cutaneous abscess of buttock L98.412 Non-pressure chronic ulcer of buttock with fat layer exposed I10 Essential (primary) hypertension Z79.01 [place] [name] (current) use of anticoagulants Modifier: Quantity: Electronic Signature(s) Signed: 12/15/2021 5:54:40 PM By: Worthy Keeler PA-C Previous Signature: 12/15/2021 5:12:14 PM Version By: Gretta Cool, BSN, RN, CWS, Kim RN, BSN Entered By: Worthy Keeler on 12/15/2021 17:54:40

## 2021-12-15 NOTE — Progress Notes (Signed)
Oncology Nurse Navigator Documentation   Ms. Tuch presented for her CT simulation today. She tolerated procedure without difficulty. She will start treatment on 1/30. Her son knows to call me if he has any questions before that appointment.   Harlow Asa RN, BSN, OCN Head & Neck Oncology Nurse [place] [facility] Phone # [phone]  Fax # [phone]

## 2021-12-15 NOTE — Progress Notes (Addendum)
Aslinger, Sojourner I. ([id number]) Visit Report for 12/15/2021 Arrival Information Details Patient Name: [name], [name]. Date of Service: 12/15/2021 2:00 PM Medical Record Number: [id number] Patient Account Number: [id number] Date of Birth/Sex: 09-Nov-1932 (89 y.o. F) Treating RN: Cornell Barman Primary Care Shadoe Cryan: Antony Contras Other Clinician: Referring Shanise Balch: Antony Contras Treating Kimberlyn Quiocho/Extender: Skipper Cliche in Treatment: 10 Visit Information History Since Last Visit Added or deleted any medications: No Patient Arrived: Cane Has Dressing in Place as Prescribed: Yes Arrival Time: 14:45 Pain Present Now: No Accompanied By: niece Transfer Assistance: None Patient Identification Verified: Yes Secondary Verification Process Completed: Yes Patient Requires Transmission-Based No Precautions: Patient Has Alerts: Yes Patient Alerts: Patient on Blood Thinner NOT DIABETIC ELIOQUIS Electronic Signature(s) Signed: 12/15/2021 5:12:14 PM By: Gretta Cool, BSN, RN, CWS, Kim RN, BSN Entered By: Gretta Cool, BSN, RN, CWS, Kim on 12/15/2021 14:46:14 [name], [name] ([id number]) -------------------------------------------------------------------------------- Clinic Level of Care Assessment Details Patient Name: [name], [name]. Date of Service: 12/15/2021 2:00 PM Medical Record Number: [id number] Patient Account Number: [id number] Date of Birth/Sex: 1932/03/22 (89 y.o. F) Treating RN: Cornell Barman Primary Care Loa Idler: Antony Contras Other Clinician: Referring Rainey Rodger: Antony Contras Treating Son Barkan/Extender: Skipper Cliche in Treatment: 10 Clinic Level of Care Assessment Items TOOL 4 Quantity Score []  - Use when only an EandM is performed on FOLLOW-UP visit 0 ASSESSMENTS - Nursing Assessment / Reassessment X - Reassessment of Co-morbidities (includes updates in patient status) 1 10 X- 1 5 Reassessment of Adherence to Treatment Plan ASSESSMENTS - Wound and Skin Assessment / Reassessment []  - Simple Wound  Assessment / Reassessment - one wound 0 X- 2 5 Complex Wound Assessment / Reassessment - multiple wounds []  - 0 Dermatologic / Skin Assessment (not related to wound area) ASSESSMENTS - Focused Assessment []  - Circumferential Edema Measurements - multi extremities 0 []  - 0 Nutritional Assessment / Counseling / Intervention []  - 0 Lower Extremity Assessment (monofilament, tuning fork, pulses) []  - 0 Peripheral Arterial Disease Assessment (using hand held doppler) ASSESSMENTS - Ostomy and/or Continence Assessment and Care []  - Incontinence Assessment and Management 0 []  - 0 Ostomy Care Assessment and Management (repouching, etc.) PROCESS - Coordination of Care X - Simple Patient / Family Education for ongoing care 1 15 []  - 0 Complex (extensive) Patient / Family Education for ongoing care X- 1 10 Staff obtains Consents, Records, Test Results / Process Orders []  - 0 Staff telephones HHA, Nursing Homes / Clarify orders / etc []  - 0 Routine Transfer to another Facility (non-emergent condition) []  - 0 Routine Hospital Admission (non-emergent condition) []  - 0 New Admissions / Biomedical engineer / Ordering NPWT, Apligraf, etc. []  - 0 Emergency Hospital Admission (emergent condition) X- 1 10 Simple Discharge Coordination []  - 0 Complex (extensive) Discharge Coordination PROCESS - Special Needs []  - Pediatric / Minor Patient Management 0 []  - 0 Isolation Patient Management []  - 0 Hearing / Language / Visual special needs []  - 0 Assessment of Community assistance (transportation, D/C planning, etc.) []  - 0 Additional assistance / Altered mentation []  - 0 Support Surface(s) Assessment (bed, cushion, seat, etc.) INTERVENTIONS - Wound Cleansing / Measurement [name], [name] ([id number]) []  - 0 Simple Wound Cleansing - one wound X- 2 5 Complex Wound Cleansing - multiple wounds X- 1 5 Wound Imaging (photographs - any number of wounds) []  - 0 Wound Tracing (instead of  photographs) []  - 0 Simple Wound Measurement - one wound X- 2 5 Complex Wound Measurement - multiple wounds INTERVENTIONS - Wound Dressings []  -  Small Wound Dressing one or multiple wounds 0 []  - 0 Medium Wound Dressing one or multiple wounds X- 1 20 Large Wound Dressing one or multiple wounds []  - 0 Application of Medications - topical []  - 0 Application of Medications - injection INTERVENTIONS - Miscellaneous []  - External ear exam 0 []  - 0 Specimen Collection (cultures, biopsies, blood, body fluids, etc.) []  - 0 Specimen(s) / Culture(s) sent or taken to Lab for analysis []  - 0 Patient Transfer (multiple staff / Civil Service fast streamer / Similar devices) []  - 0 Simple Staple / Suture removal (25 or less) []  - 0 Complex Staple / Suture removal (26 or more) []  - 0 Hypo / Hyperglycemic Management (close monitor of Blood Glucose) []  - 0 Ankle / Brachial Index (ABI) - do not check if billed separately X- 1 5 Vital Signs Has the patient been seen at the hospital within the last three years: Yes Total Score: 110 Level Of Care: New/Established - Level 3 Electronic Signature(s) Signed: 12/15/2021 5:12:14 PM By: Gretta Cool, BSN, RN, CWS, Kim RN, BSN Entered By: Gretta Cool, BSN, RN, CWS, Kim on 12/15/2021 15:22:08 [name], [name] ([id number]) -------------------------------------------------------------------------------- Lower Extremity Assessment Details Patient Name: [name], [name]. Date of Service: 12/15/2021 2:00 PM Medical Record Number: [id number] Patient Account Number: [id number] Date of Birth/Sex: [birth year] (89 y.o. F) Treating RN: Cornell Barman Primary Care Deano Tomaszewski: Antony Contras Other Clinician: Referring Ramanda Paules: Antony Contras Treating Mika Griffitts/Extender: Skipper Cliche in Treatment: 10 Electronic Signature(s) Signed: 12/15/2021 5:12:14 PM By: Gretta Cool, BSN, RN, CWS, Kim RN, BSN Entered By: Gretta Cool, BSN, RN, CWS, Kim on 12/15/2021 14:55:56 [name], [name].  ([id number]) -------------------------------------------------------------------------------- Multi Wound Chart Details Patient Name: [name], [name]. Date of Service: 12/15/2021 2:00 PM Medical Record Number: [id number] Patient Account Number: [id number] Date of Birth/Sex: [birth year] (89 y.o. F) Treating RN: Cornell Barman Primary Care Fantasha Daniele: Antony Contras Other Clinician: Referring Draven Natter: Antony Contras Treating Elliott Quade/Extender: Skipper Cliche in Treatment: 10 Vital Signs Height(in): 60 Pulse(bpm): 101 Weight(lbs): 116 Blood Pressure(mmHg): 143/79 Body Mass Index(BMI): 22.7 Temperature(F): 98.8 Respiratory Rate(breaths/min): 16 Photos: [N/A:N/A] Wound Location: Midline Sacrum Proximal, Midline Sacrum N/A Wounding Event: Pressure Injury Pressure Injury N/A Primary Etiology: Abscess Pressure Ulcer N/A Comorbid History: Hypertension, Peripheral Venous Hypertension, Peripheral Venous N/A Disease, Received Chemotherapy Disease, Received Chemotherapy Date Acquired: 07/06/2021 12/07/2021 N/A Weeks of Treatment: 10 0 N/A Wound Status: Open Open N/A Wound Recurrence: No No N/A Measurements L x W x D (cm) 1.5x0.4x0.3 0.5x0.6x0.1 N/A Area (cm) : 0.471 0.236 N/A Volume (cm) : 0.141 0.024 N/A % Reduction in Area: 91.40% N/A N/A % Reduction in Volume: 87.20% N/A N/A Starting Position 1 (o'clock): 12 Ending Position 1 (o'clock): 12 Maximum Distance 1 (cm): 0.5 Undermining: Yes N/A N/A Classification: Full Thickness Without Exposed Category/Stage II N/A Support Structures Exudate Amount: Medium Medium N/A Exudate Type: Serous Serous N/A Exudate Color: amber amber N/A Wound Margin: Flat and Intact Flat and Intact N/A Granulation Amount: Large (67-100%) Medium (34-66%) N/A Granulation Quality: Pink Pink N/A Necrotic Amount: Small (1-33%) Medium (34-66%) N/A Exposed Structures: Fat Layer (Subcutaneous Tissue): Fat Layer (Subcutaneous Tissue): N/A Yes Yes Fascia: No Fascia:  No Tendon: No Tendon: No Muscle: No Muscle: No Joint: No Joint: No Bone: No Bone: No Epithelialization: None N/A N/A Assessment Notes: Macerated around edges N/A N/A Treatment Notes Electronic Signature(s) Geffert, Sadako I. ([id number]) Signed: 12/15/2021 5:12:14 PM By: Gretta Cool, BSN, RN, CWS, Kim RN, BSN Entered By: Gretta Cool, BSN, RN, CWS, Kim on 12/15/2021 15:18:37 Poli, Lucianne I. ([id number]) --------------------------------------------------------------------------------  Multi-Disciplinary Care Plan Details Patient Name: [name], [name]. Date of Service: 12/15/2021 2:00 PM Medical Record Number: [id number] Patient Account Number: [id number] Date of Birth/Sex: Dec 01, 1931 (89 y.o. F) Treating RN: Cornell Barman Primary Care Najwa Spillane: Antony Contras Other Clinician: Referring Chiquita Heckert: Antony Contras Treating Chinedum Vanhouten/Extender: Skipper Cliche in Treatment: 10 Active Inactive Abuse / Safety / Falls / Self Care Management Nursing Diagnoses: Potential for falls Self care deficit: actual or potential Goals: Patient will remain injury free related to falls Date Initiated: 10/05/2021 Target Resolution Date: 10/05/2021 Goal Status: Active Interventions: Provide education on safe transfers Notes: Necrotic Tissue Nursing Diagnoses: Impaired tissue integrity related to necrotic/devitalized tissue Knowledge deficit related to management of necrotic/devitalized tissue Goals: Necrotic/devitalized tissue will be minimized in the wound bed Date Initiated: 10/05/2021 Target Resolution Date: 10/02/2021 Goal Status: Active Patient/caregiver will verbalize understanding of reason and process for debridement of necrotic tissue Date Initiated: 10/05/2021 Target Resolution Date: 10/02/2021 Goal Status: Active Interventions: Assess patient pain level pre-, during and post procedure and prior to discharge Provide education on necrotic tissue and debridement process Treatment Activities: Apply topical  anesthetic as ordered : 10/05/2021 Excisional debridement : 10/05/2021 Notes: Nutrition Nursing Diagnoses: Potential for alteratiion in Nutrition/Potential for imbalanced nutrition Goals: Patient/caregiver agrees to and verbalizes understanding of need to use nutritional supplements and/or vitamins as prescribed Date Initiated: 10/19/2021 Target Resolution Date: 10/19/2021 Goal Status: Active Interventions: Provide education on nutrition Treatment Activities: Geremia, Dura I. ([id number]) Education provided on Nutrition : 11/10/2021 Notes: Pain, Acute or Chronic Nursing Diagnoses: Pain Management - Non-cyclic Acute (Procedural) Goals: Patient will verbalize adequate pain control and receive pain control interventions during procedures as needed Date Initiated: 10/05/2021 Target Resolution Date: 10/02/2021 Goal Status: Active Interventions: Assess comfort goal upon admission Treatment Activities: Administer pain control measures as ordered : 10/05/2021 Notes: Wound/Skin Impairment Nursing Diagnoses: Impaired tissue integrity Knowledge deficit related to ulceration/compromised skin integrity Goals: Ulcer/skin breakdown will have a volume reduction of 30% by week 4 Date Initiated: 10/05/2021 Target Resolution Date: 10/30/2021 Goal Status: Active Ulcer/skin breakdown will have a volume reduction of 50% by week 8 Date Initiated: 10/05/2021 Target Resolution Date: 11/27/2021 Goal Status: Active Ulcer/skin breakdown will have a volume reduction of 80% by week 12 Date Initiated: 10/05/2021 Target Resolution Date: 12/25/2021 Goal Status: Active Ulcer/skin breakdown will heal within 14 weeks Date Initiated: 10/05/2021 Target Resolution Date: 01/08/2022 Goal Status: Active Interventions: Assess patient/caregiver ability to obtain necessary supplies Provide education on ulcer and skin care Treatment Activities: Referred to DME Mykell Rawl for dressing supplies : 10/05/2021 Topical  wound management initiated : 10/05/2021 Notes: Electronic Signature(s) Signed: 12/15/2021 5:12:14 PM By: Gretta Cool, BSN, RN, CWS, Kim RN, BSN Entered By: Gretta Cool, BSN, RN, CWS, Kim on 12/15/2021 15:18:18 [name], [name]. ([id number]) -------------------------------------------------------------------------------- Pain Assessment Details Patient Name: [name], [name]. Date of Service: 12/15/2021 2:00 PM Medical Record Number: [id number] Patient Account Number: [id number] Date of Birth/Sex: June 03, 1932 (89 y.o. F) Treating RN: Cornell Barman Primary Care Jayvon Mounger: Antony Contras Other Clinician: Referring Vivienne Sangiovanni: Antony Contras Treating Tashia Leiterman/Extender: Skipper Cliche in Treatment: 10 Active Problems Location of Pain Severity and Description of Pain Patient Has Paino No Site Locations Pain Management and Medication Current Pain Management: Notes Patient denies pain at this time. Electronic Signature(s) Signed: 12/15/2021 5:12:14 PM By: Gretta Cool, BSN, RN, CWS, Kim RN, BSN Entered By: Gretta Cool, BSN, RN, CWS, Kim on 12/15/2021 14:48:44 [name], [name]. ([id number]) -------------------------------------------------------------------------------- Wound Assessment Details Patient Name: [name], [name]. Date of Service: 12/15/2021 2:00 PM Medical Record Number:  [id number] Patient Account Number: [id number] Date of Birth/Sex: 1932/05/01 (89 y.o. F) Treating RN: Cornell Barman Primary Care Kaidyn Hernandes: Antony Contras Other Clinician: Referring Lela Gell: Antony Contras Treating Artez Regis/Extender: Skipper Cliche in Treatment: 10 Wound Status Wound Number: 1 Primary Abscess Etiology: Wound Location: Midline Sacrum Wound Status: Open Wounding Event: Pressure Injury Comorbid Hypertension, Peripheral Venous Disease, Received Date Acquired: 07/06/2021 History: Chemotherapy Weeks Of Treatment: 10 Clustered Wound: No Photos Photo Uploaded By: Gretta Cool, BSN, RN, CWS, Kim on 12/15/2021 15:00:35 Wound Measurements Length: (cm)  1.5 Width: (cm) 0.4 Depth: (cm) 0.3 Area: (cm) 0.471 Volume: (cm) 0.141 % Reduction in Area: 91.4% % Reduction in Volume: 87.2% Epithelialization: None Tunneling: No Undermining: Yes Starting Position (o'clock): 12 Ending Position (o'clock): 12 Maximum Distance: (cm) 0.5 Wound Description Classification: Full Thickness Without Exposed Support Structu Wound Margin: Flat and Intact Exudate Amount: Medium Exudate Type: Serous Exudate Color: amber res Foul Odor After Cleansing: No Slough/Fibrino Yes Wound Bed Granulation Amount: Large (67-100%) Exposed Structure Granulation Quality: Pink Fascia Exposed: No Necrotic Amount: Small (1-33%) Fat Layer (Subcutaneous Tissue) Exposed: Yes Necrotic Quality: Adherent Slough Tendon Exposed: No Muscle Exposed: No Joint Exposed: No Bone Exposed: No Assessment Notes Macerated around edges Electronic Signature(s) [name], [name]. ([id number]) Signed: 12/15/2021 5:12:14 PM By: Gretta Cool, BSN, RN, CWS, Kim RN, BSN Entered By: Gretta Cool, BSN, RN, CWS, Kim on 12/15/2021 14:54:03 [name], [name]. ([id number]) -------------------------------------------------------------------------------- Wound Assessment Details Patient Name: [name], [name] I. Date of Service: 12/15/2021 2:00 PM Medical Record Number: [id number] Patient Account Number: [id number] Date of Birth/Sex: 1931/12/10 (89 y.o. F) Treating RN: Cornell Barman Primary Care Yamil Dougher: Antony Contras Other Clinician: Referring Ante Arredondo: Antony Contras Treating Tammee Thielke/Extender: Skipper Cliche in Treatment: 10 Wound Status Wound Number: 2 Primary Pressure Ulcer Etiology: Wound Location: Proximal, Midline Sacrum Wound Status: Open Wounding Event: Pressure Injury Comorbid Hypertension, Peripheral Venous Disease, Received Date Acquired: 12/07/2021 History: Chemotherapy Weeks Of Treatment: 0 Clustered Wound: No Photos Wound Measurements Length: (cm) 0.5 Width: (cm) 0.6 Depth: (cm) 0.1 Area: (cm)  0.236 Volume: (cm) 0.024 % Reduction in Area: 0% % Reduction in Volume: 0% Wound Description Classification: Category/Stage II Wound Margin: Flat and Intact Exudate Amount: Medium Exudate Type: Serous Exudate Color: amber Foul Odor After Cleansing: No Slough/Fibrino Yes Wound Bed Granulation Amount: Medium (34-66%) Exposed Structure Granulation Quality: Pink Fascia Exposed: No Necrotic Amount: Medium (34-66%) Fat Layer (Subcutaneous Tissue) Exposed: Yes Necrotic Quality: Adherent Slough Tendon Exposed: No Muscle Exposed: No Joint Exposed: No Bone Exposed: No Electronic Signature(s) Signed: 12/15/2021 4:51:39 PM By: Gretta Cool, BSN, RN, CWS, Kim RN, BSN Entered By: Gretta Cool, BSN, RN, CWS, Kim on 12/15/2021 16:51:38 Lewinski, Suzannah I. ([id number]) -------------------------------------------------------------------------------- Vitals Details Patient Name: Longenecker, Jalie I. Date of Service: 12/15/2021 2:00 PM Medical Record Number: [id number] Patient Account Number: [id number] Date of Birth/Sex: May 28, 1932 (89 y.o. F) Treating RN: Cornell Barman Primary Care Devantae Babe: Antony Contras Other Clinician: Referring Jamielee Mchale: Antony Contras Treating Ski Polich/Extender: Skipper Cliche in Treatment: 10 Vital Signs Time Taken: 14:26 Temperature (F): 98.8 Height (in): 60 Pulse (bpm): 101 Weight (lbs): 116 Respiratory Rate (breaths/min): 16 Body Mass Index (BMI): 22.7 Blood Pressure (mmHg): 143/79 Reference Range: 80 - 120 mg / dl Electronic Signature(s) Signed: 12/15/2021 5:12:14 PM By: Gretta Cool, BSN, RN, CWS, Kim RN, BSN Entered By: Gretta Cool, BSN, RN, CWS, Kim on 12/15/2021 14:48:23

## 2021-12-16 ENCOUNTER — Telehealth: Payer: Self-pay | Admitting: *Deleted

## 2021-12-16 ENCOUNTER — Other Ambulatory Visit: Payer: Self-pay | Admitting: Hematology and Oncology

## 2021-12-16 DIAGNOSIS — Z510 Encounter for antineoplastic radiation therapy: Secondary | ICD-10-CM | POA: Diagnosis not present

## 2021-12-16 DIAGNOSIS — C8331 Diffuse large B-cell lymphoma, lymph nodes of head, face, and neck: Secondary | ICD-10-CM | POA: Diagnosis not present

## 2021-12-16 DIAGNOSIS — E041 Nontoxic single thyroid nodule: Secondary | ICD-10-CM

## 2021-12-16 NOTE — Telephone Encounter (Signed)
-----   Message from Benay Pike, MD sent at 12/16/2021  8:30 AM EST ----- Can you let her son know that I ordered biopsy of the thyroid nodules please.  Thanks,

## 2021-12-16 NOTE — Telephone Encounter (Signed)
This RN contacted the pt's son regarding review of US thyroid by MD with recommendation for bx. John verbalized understanding and agreement to plan.  No further questions at this time.

## 2021-12-17 ENCOUNTER — Other Ambulatory Visit: Payer: Self-pay | Admitting: *Deleted

## 2021-12-21 ENCOUNTER — Ambulatory Visit: Payer: Medicare HMO | Admitting: Radiation Oncology

## 2021-12-21 ENCOUNTER — Ambulatory Visit
Admission: RE | Admit: 2021-12-21 | Discharge: 2021-12-21 | Disposition: A | Discharge status: Home / Self Care | Payer: Medicare HMO | Source: Ambulatory Visit | Attending: Radiation Oncology | Admitting: Radiation Oncology

## 2021-12-21 DIAGNOSIS — Z510 Encounter for antineoplastic radiation therapy: Secondary | ICD-10-CM | POA: Diagnosis not present

## 2021-12-21 DIAGNOSIS — C8331 Diffuse large B-cell lymphoma, lymph nodes of head, face, and neck: Secondary | ICD-10-CM | POA: Diagnosis not present

## 2021-12-21 NOTE — Progress Notes (Signed)
Oncology Nurse Navigator Documentation   To provide support, encouragement and care continuity, met with Ms. Sampath for her initial RT.  She was accompanied by her son. I reviewed the 2-step treatment process, answered questions.  Ms. Florio completed treatment without difficulty, denied questions/concerns. I reviewed the registration/arrival procedure for subsequent treatments. I encouraged them to call me with questions/concerns as tmts proceed.   Harlow Asa RN, BSN, OCN Head & Neck Oncology Nurse [place] at [facility] Phone # [phone]  Fax # [phone]

## 2021-12-22 ENCOUNTER — Ambulatory Visit: Payer: Medicare HMO

## 2021-12-22 ENCOUNTER — Ambulatory Visit
Admission: RE | Admit: 2021-12-22 | Discharge: 2021-12-22 | Disposition: A | Discharge status: Home / Self Care | Payer: Medicare HMO | Source: Ambulatory Visit | Attending: Radiation Oncology | Admitting: Radiation Oncology

## 2021-12-22 ENCOUNTER — Other Ambulatory Visit: Payer: Self-pay

## 2021-12-22 DIAGNOSIS — C8331 Diffuse large B-cell lymphoma, lymph nodes of head, face, and neck: Secondary | ICD-10-CM | POA: Diagnosis not present

## 2021-12-22 DIAGNOSIS — Z510 Encounter for antineoplastic radiation therapy: Secondary | ICD-10-CM | POA: Diagnosis not present

## 2021-12-23 ENCOUNTER — Ambulatory Visit
Admission: RE | Admit: 2021-12-23 | Discharge: 2021-12-23 | Disposition: A | Discharge status: Home / Self Care | Payer: Medicare HMO | Source: Ambulatory Visit | Attending: Radiation Oncology | Admitting: Radiation Oncology

## 2021-12-23 ENCOUNTER — Ambulatory Visit: Payer: Medicare HMO

## 2021-12-23 DIAGNOSIS — Z510 Encounter for antineoplastic radiation therapy: Secondary | ICD-10-CM | POA: Insufficient documentation

## 2021-12-23 DIAGNOSIS — E041 Nontoxic single thyroid nodule: Secondary | ICD-10-CM | POA: Diagnosis not present

## 2021-12-23 DIAGNOSIS — C8331 Diffuse large B-cell lymphoma, lymph nodes of head, face, and neck: Secondary | ICD-10-CM | POA: Insufficient documentation

## 2021-12-23 DIAGNOSIS — E042 Nontoxic multinodular goiter: Secondary | ICD-10-CM | POA: Diagnosis not present

## 2021-12-24 ENCOUNTER — Ambulatory Visit: Payer: Medicare HMO

## 2021-12-24 ENCOUNTER — Ambulatory Visit
Admission: RE | Admit: 2021-12-24 | Discharge: 2021-12-24 | Disposition: A | Discharge status: Home / Self Care | Payer: Medicare HMO | Source: Ambulatory Visit | Attending: Radiation Oncology | Admitting: Radiation Oncology

## 2021-12-24 ENCOUNTER — Other Ambulatory Visit (HOSPITAL_COMMUNITY)
Admission: RE | Admit: 2021-12-24 | Discharge: 2021-12-24 | Disposition: A | Discharge status: Home / Self Care | Payer: Medicare HMO | Source: Ambulatory Visit | Attending: Hematology and Oncology | Admitting: Hematology and Oncology

## 2021-12-24 ENCOUNTER — Ambulatory Visit
Admission: RE | Admit: 2021-12-24 | Discharge: 2021-12-24 | Disposition: A | Discharge status: Home / Self Care | Payer: Medicare HMO | Source: Ambulatory Visit | Attending: Hematology and Oncology | Admitting: Hematology and Oncology

## 2021-12-24 ENCOUNTER — Other Ambulatory Visit: Payer: Self-pay

## 2021-12-24 DIAGNOSIS — Z510 Encounter for antineoplastic radiation therapy: Secondary | ICD-10-CM | POA: Diagnosis not present

## 2021-12-24 DIAGNOSIS — E042 Nontoxic multinodular goiter: Secondary | ICD-10-CM | POA: Insufficient documentation

## 2021-12-24 DIAGNOSIS — E041 Nontoxic single thyroid nodule: Secondary | ICD-10-CM | POA: Diagnosis not present

## 2021-12-24 DIAGNOSIS — C8331 Diffuse large B-cell lymphoma, lymph nodes of head, face, and neck: Secondary | ICD-10-CM | POA: Diagnosis not present

## 2021-12-25 ENCOUNTER — Ambulatory Visit: Payer: Medicare HMO

## 2021-12-25 ENCOUNTER — Ambulatory Visit
Admission: RE | Admit: 2021-12-25 | Discharge: 2021-12-25 | Disposition: A | Discharge status: Home / Self Care | Payer: Medicare HMO | Source: Ambulatory Visit | Attending: Radiation Oncology | Admitting: Radiation Oncology

## 2021-12-25 ENCOUNTER — Encounter: Payer: Self-pay | Admitting: Hematology and Oncology

## 2021-12-25 DIAGNOSIS — Z510 Encounter for antineoplastic radiation therapy: Secondary | ICD-10-CM | POA: Diagnosis not present

## 2021-12-25 DIAGNOSIS — C8331 Diffuse large B-cell lymphoma, lymph nodes of head, face, and neck: Secondary | ICD-10-CM | POA: Diagnosis not present

## 2021-12-25 LAB — CYTOLOGY - NON PAP

## 2021-12-28 ENCOUNTER — Ambulatory Visit
Admission: RE | Admit: 2021-12-28 | Discharge: 2021-12-28 | Disposition: A | Discharge status: Home / Self Care | Payer: Medicare HMO | Source: Ambulatory Visit | Attending: Radiation Oncology | Admitting: Radiation Oncology

## 2021-12-28 ENCOUNTER — Other Ambulatory Visit: Payer: Self-pay

## 2021-12-28 ENCOUNTER — Ambulatory Visit: Payer: Medicare HMO

## 2021-12-28 ENCOUNTER — Other Ambulatory Visit: Payer: Self-pay | Admitting: Radiation Oncology

## 2021-12-28 DIAGNOSIS — C8331 Diffuse large B-cell lymphoma, lymph nodes of head, face, and neck: Secondary | ICD-10-CM | POA: Diagnosis not present

## 2021-12-28 DIAGNOSIS — Z510 Encounter for antineoplastic radiation therapy: Secondary | ICD-10-CM | POA: Diagnosis not present

## 2021-12-28 MED ORDER — LIDOCAINE VISCOUS HCL 2 % MT SOLN: OROMUCOSAL | 4 refills | Status: DC

## 2021-12-29 ENCOUNTER — Inpatient Hospital Stay: Payer: Medicare HMO | Attending: Hematology and Oncology | Admitting: Dietician

## 2021-12-29 ENCOUNTER — Ambulatory Visit
Admission: RE | Admit: 2021-12-29 | Discharge: 2021-12-29 | Disposition: A | Discharge status: Home / Self Care | Payer: Medicare HMO | Source: Ambulatory Visit | Attending: Radiation Oncology | Admitting: Radiation Oncology

## 2021-12-29 ENCOUNTER — Ambulatory Visit: Payer: Medicare HMO

## 2021-12-29 DIAGNOSIS — C8331 Diffuse large B-cell lymphoma, lymph nodes of head, face, and neck: Secondary | ICD-10-CM | POA: Insufficient documentation

## 2021-12-29 DIAGNOSIS — Z7901 Long term (current) use of anticoagulants: Secondary | ICD-10-CM | POA: Insufficient documentation

## 2021-12-29 DIAGNOSIS — E042 Nontoxic multinodular goiter: Secondary | ICD-10-CM | POA: Insufficient documentation

## 2021-12-29 DIAGNOSIS — I10 Essential (primary) hypertension: Secondary | ICD-10-CM | POA: Insufficient documentation

## 2021-12-29 DIAGNOSIS — I82411 Acute embolism and thrombosis of right femoral vein: Secondary | ICD-10-CM | POA: Insufficient documentation

## 2021-12-29 DIAGNOSIS — D509 Iron deficiency anemia, unspecified: Secondary | ICD-10-CM | POA: Insufficient documentation

## 2021-12-29 DIAGNOSIS — Z510 Encounter for antineoplastic radiation therapy: Secondary | ICD-10-CM | POA: Diagnosis not present

## 2021-12-29 DIAGNOSIS — R600 Localized edema: Secondary | ICD-10-CM | POA: Insufficient documentation

## 2021-12-29 NOTE — Progress Notes (Signed)
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 She completed 6 cycles R mini CHOP (7/13-10/28). Patient is currently receiving palliative radiation. Patient is under the care of Dr. Chryl Heck and Dr. Isidore Moos.   Past medical history includes short term memory loss, anemia, GERD, arthritis, HLD, HTN, hiatal hernia s/p BIH.  Met with patient and granddaughter in office after radiation therapy. Patient reports tolerating treatment well. She has mild sore throat, denies swallowing difficulty. Granddaughter shares that patient has dementia and often times does not feel/remember pain with swallowing. They started giving her lidocaine rinse before meal times yesterday. Granddaughter reports family is with her in the evenings and overnight, but unsure of what she eats during the day. Yesterday she recalls 1/2 cheeseburger, fries, yogurt, oatmeal, mixed vegetables, and drank 2 Ensure Original. Patient reports she likes these a lot. Patient reports she drinks ~1 quart of water everyday.    Nutrition Focused Physical Exam: deferred   Medications: xylocaine, ativan, eliquis, zyloprim, namenda, zetia, lipitor, norvasc   Labs: 1/5 labs reviewed and include glucose 112   Anthropometrics: Last weight ([facility])108.4 lb on 2/6 decreased 7.4% from 117 lb on 12/30; significant   Height: 4'11" Weight: 108.4 lb  UBW: 112-117 lb (Aug-Dec 2022) BMI: 21.9    NUTRITION DIAGNOSIS: Inadequate oral intake related to cancer and associated treatment side effects (DLBCL of nasopharynx undergoing palliative radiation therapy) as evidenced by sore throat, pain with swallowing regular textures, 7.4% weight decrease in 6 weeks which is significant for time frame.   INTERVENTION:  Educated on increased calorie and protein needs to maintain weights/strength Encouraged small frequent meals and snacks with soft smooth foods for ease of intake - handout  with ideas and ways to alter texture of foods provided Discussed ways to add calories and protein to foods (using milk in oatmeal, adding Ensure to coffee, using to prepare milkshakes, adding cheese to foods, cooking with butter) - high calorie shake recipes provided Continue drinking 2-3 Ensure daily, recommend switching to Ensure Plus/equivalent for added calories - samples and coupons provided Continue lidocaine rinse per MD Contact information provided    MONITORING, EVALUATION, GOAL: Patient will tolerate increased calories and protein to promote weight gain/stability   Next Visit: To be scheduled as needed

## 2021-12-30 ENCOUNTER — Ambulatory Visit: Payer: Medicare HMO

## 2021-12-30 ENCOUNTER — Ambulatory Visit
Admission: RE | Admit: 2021-12-30 | Discharge: 2021-12-30 | Disposition: A | Discharge status: Home / Self Care | Payer: Medicare HMO | Source: Ambulatory Visit | Attending: Radiation Oncology | Admitting: Radiation Oncology

## 2021-12-30 ENCOUNTER — Other Ambulatory Visit: Payer: Self-pay

## 2021-12-30 DIAGNOSIS — C8331 Diffuse large B-cell lymphoma, lymph nodes of head, face, and neck: Secondary | ICD-10-CM | POA: Diagnosis not present

## 2021-12-30 DIAGNOSIS — Z510 Encounter for antineoplastic radiation therapy: Secondary | ICD-10-CM | POA: Diagnosis not present

## 2021-12-31 ENCOUNTER — Ambulatory Visit: Payer: Medicare HMO | Admitting: Physician Assistant

## 2021-12-31 ENCOUNTER — Ambulatory Visit: Payer: Medicare HMO

## 2021-12-31 ENCOUNTER — Ambulatory Visit
Admission: RE | Admit: 2021-12-31 | Discharge: 2021-12-31 | Disposition: A | Discharge status: Home / Self Care | Payer: Medicare HMO | Source: Ambulatory Visit | Attending: Radiation Oncology | Admitting: Radiation Oncology

## 2021-12-31 DIAGNOSIS — E441 Mild protein-calorie malnutrition: Secondary | ICD-10-CM | POA: Diagnosis not present

## 2021-12-31 DIAGNOSIS — C8331 Diffuse large B-cell lymphoma, lymph nodes of head, face, and neck: Secondary | ICD-10-CM | POA: Diagnosis not present

## 2021-12-31 DIAGNOSIS — Z510 Encounter for antineoplastic radiation therapy: Secondary | ICD-10-CM | POA: Diagnosis not present

## 2022-01-01 ENCOUNTER — Ambulatory Visit
Admission: RE | Admit: 2022-01-01 | Discharge: 2022-01-01 | Disposition: A | Discharge status: Home / Self Care | Payer: Medicare HMO | Source: Ambulatory Visit | Attending: Radiation Oncology | Admitting: Radiation Oncology

## 2022-01-01 ENCOUNTER — Encounter: Payer: Self-pay | Admitting: Radiation Oncology

## 2022-01-01 ENCOUNTER — Ambulatory Visit: Payer: Medicare HMO

## 2022-01-01 ENCOUNTER — Other Ambulatory Visit: Payer: Self-pay

## 2022-01-01 DIAGNOSIS — C8331 Diffuse large B-cell lymphoma, lymph nodes of head, face, and neck: Secondary | ICD-10-CM | POA: Diagnosis not present

## 2022-01-01 DIAGNOSIS — Z510 Encounter for antineoplastic radiation therapy: Secondary | ICD-10-CM | POA: Diagnosis not present

## 2022-01-01 NOTE — Progress Notes (Signed)
Oncology Nurse Navigator Documentation   Met with [name] and her daughter after final RT to offer support and to celebrate end of radiation treatment.   Provided verbal post-RT guidance: Importance of keeping all follow-up appts, especially those with Nutrition. Importance of protecting treatment area from sun. Continuation of Sonafine application 2-3 times daily, application of antibiotic ointment to areas of raw skin; when supply of Sonafine exhausted transition to OTC lotion with vitamin E.  Explained my role as navigator will continue for several more months, encouraged him to call me with needs/concerns.    Harlow Asa RN, BSN, OCN Head & Neck Oncology Nurse [place] at [facility] Phone # [phone]  Fax # [phone]

## 2022-01-04 ENCOUNTER — Encounter: Payer: Medicare HMO | Attending: Physician Assistant | Admitting: Physician Assistant

## 2022-01-04 ENCOUNTER — Telehealth: Payer: Self-pay

## 2022-01-04 ENCOUNTER — Other Ambulatory Visit: Payer: Self-pay

## 2022-01-04 ENCOUNTER — Telehealth: Payer: Self-pay | Admitting: *Deleted

## 2022-01-04 DIAGNOSIS — F0150 Vascular dementia without behavioral disturbance: Secondary | ICD-10-CM | POA: Diagnosis not present

## 2022-01-04 DIAGNOSIS — L98412 Non-pressure chronic ulcer of buttock with fat layer exposed: Secondary | ICD-10-CM | POA: Diagnosis not present

## 2022-01-04 DIAGNOSIS — I10 Essential (primary) hypertension: Secondary | ICD-10-CM | POA: Diagnosis not present

## 2022-01-04 DIAGNOSIS — L0231 Cutaneous abscess of buttock: Secondary | ICD-10-CM | POA: Diagnosis not present

## 2022-01-04 DIAGNOSIS — L89152 Pressure ulcer of sacral region, stage 2: Secondary | ICD-10-CM | POA: Diagnosis not present

## 2022-01-04 DIAGNOSIS — L03317 Cellulitis of buttock: Secondary | ICD-10-CM | POA: Diagnosis not present

## 2022-01-04 DIAGNOSIS — Z7901 Long term (current) use of anticoagulants: Secondary | ICD-10-CM | POA: Insufficient documentation

## 2022-01-04 NOTE — Telephone Encounter (Signed)
Call and spoke with son Jenny Reichmann and he will callback and schedule new appointment

## 2022-01-04 NOTE — Progress Notes (Addendum)
[name], [name] ([id number]) Visit Report for 01/04/2022 Chief Complaint Document Details Patient Name: [name], [name]. Date of Service: 01/04/2022 2:30 PM Medical Record Number: [id number] Patient Account Number: [id number] Date of Birth/Sex: [birth year] (88 y.o. F) Treating RN: Donnamarie Poag Primary Care Provider: Antony Contras Other Clinician: Referring Provider: Antony Contras Treating Provider/Extender: Skipper Cliche in Treatment: 13 Information Obtained from: Patient Chief Complaint Sacral pressure ulcer Electronic Signature(s) Signed: 01/04/2022 2:42:48 PM By: Worthy Keeler PA-C Entered By: Worthy Keeler on 01/04/2022 14:42:47 [name], [name]. ([id number]) -------------------------------------------------------------------------------- HPI Details Patient Name: [name], [name]. Date of Service: 01/04/2022 2:30 PM Medical Record Number: [id number] Patient Account Number: [id number] Date of Birth/Sex: [birth year] (88 y.o. F) Treating RN: Donnamarie Poag Primary Care Provider: Antony Contras Other Clinician: Referring Provider: Antony Contras Treating Provider/Extender: Skipper Cliche in Treatment: 13 History of Present Illness HPI Description: 10/05/2021 patient presents today for a wound that actually began on around July 06, 2021. This appears initially to have been she tells me a blister that came up and then subsequently open. My concern here is that this was probably more of an abscess than a pressure ulcer she is actually a fairly active individual she does not lay in bed all the time she does ambulate and again I think pressure is a much less likely because of this to be perfectly honest that an abscess. Nonetheless I see no signs of infection right now she does have some slough buildup on the surface of the wound but nothing too significant which is great news. No fevers, chills, nausea, vomiting, or diarrhea. The patient does have a history otherwise of hypertension, [place]-term use of  anticoagulant therapy, and she does have some dementia. 10/19/2021 upon evaluation today patient's wound showed some signs of improvement though she continues to have some issues as well with some slough buildup this can require little bit of sharp debridement. She did not have a dressing on today but she had just recently taken a shower. Obviously that is okay but we do want to try to keep this covered is much as possible to help keep it from getting too wet. She is also has been having unfortunately some issues with urinary symptoms frequent urination being the main thing here. Again I do believe that still of utmost concern for her to get checked out if she has a UTI that needs to be addressed sooner rather than later. 11/10/2021 upon evaluation today patient appears to be doing well with regard to her wound from the standpoint of there being no infection unfortunately she does have a lot of moisture and this really is not being changed as far as the dressing is concerned. We have been using collagen but nobody is really been changing the dressings regularly as directed. Nonetheless her niece who is with her today says that she is can work on getting that done appropriately. 12/15/2021 upon evaluation today patient appears to be doing somewhat better in regard to the wound in the sacral region. Fortunately there does not appear to be any evidence of active infection locally nor systemically at this point she has a small pressure injury just above and to the left of the original wound this is very superficial but nonetheless present. 01/04/2022 upon evaluation today patient appears to be doing well with regard to her wound. This is showing signs of improvement one of the 2 wounds is completely healed at the original is still open but smaller. Overall I am in  agreement that this seems to be heading in the right direction. Electronic Signature(s) Signed: 01/04/2022 3:15:12 PM By: Worthy Keeler  PA-C Entered By: Worthy Keeler on 01/04/2022 15:15:12 [name], [name]. ([id number]) -------------------------------------------------------------------------------- Physical Exam Details Patient Name: [name], [name] Date of Service: 01/04/2022 2:30 PM Medical Record Number: [id number] Patient Account Number: [id number] Date of Birth/Sex: 12-19-1931 (88 y.o. F) Treating RN: Donnamarie Poag Primary Care Provider: Antony Contras Other Clinician: Referring Provider: Antony Contras Treating Provider/Extender: Skipper Cliche in Treatment: 45 Constitutional Well-nourished and well-hydrated in no acute distress. Respiratory normal breathing without difficulty. Psychiatric this patient is able to make decisions and demonstrates good insight into disease process. Alert and Oriented x 3. pleasant and cooperative. Notes Upon inspection patient's wound bed actually showed signs of good granulation and epithelization at this point. Fortunately I do not see any evidence of active infection locally nor systemically at this time which is great news and overall I am extremely pleased with where we stand today. Electronic Signature(s) Signed: 01/04/2022 3:15:29 PM By: Worthy Keeler PA-C Entered By: Worthy Keeler on 01/04/2022 15:15:29 [name], [name]. ([id number]) -------------------------------------------------------------------------------- Physician Orders Details Patient Name: [name], [name] Date of Service: 01/04/2022 2:30 PM Medical Record Number: [id number] Patient Account Number: [id number] Date of Birth/Sex: January 13, 1932 (88 y.o. F) Treating RN: Donnamarie Poag Primary Care Provider: Antony Contras Other Clinician: Referring Provider: Antony Contras Treating Provider/Extender: Skipper Cliche in Treatment: 46 Verbal / Phone Orders: No Diagnosis Coding ICD-10 Coding Code Description L02.31 Cutaneous abscess of buttock L98.412 Non-pressure chronic ulcer of buttock with fat layer exposed L89.152  Pressure ulcer of sacral region, stage 2 I10 Essential (primary) hypertension Z79.01 [place] term (current) use of anticoagulants F01.50 Vascular dementia without behavioral disturbance Follow-up Appointments o Return Appointment in 2 weeks. o Other: - Call the office with any concerns [phone] Bathing/ Shower/ Hygiene o May shower; gently cleanse wound with antibacterial soap, rinse and pat dry prior to dressing wounds Anesthetic (Use 'Patient Medications' Section for Anesthetic Order Entry) o Lidocaine applied to wound bed Off-Loading o Turn and reposition every 2 hours - pt educated on importance to reposition every two hours to help with pressure on sacrum. Cushion for chair to be ordered [place] o Other: - pt also educated on importance to change dressing every three days as ordered. Patient states daughter helps with dressing changes. Additional Orders / Instructions o Follow Nutritious Diet and Increase Protein Intake Wound Treatment Wound #1 - Sacrum Wound Laterality: Midline Cleanser: Normal Saline (Generic) 3 x Per Week/30 Days Discharge Instructions: Wash your hands with soap and water. Remove old dressing, discard into plastic bag and place into trash. Cleanse the wound with Normal Saline prior to applying a clean dressing using gauze sponges, not tissues or cotton balls. Do not scrub or use excessive force. Pat dry using gauze sponges, not tissue or cotton balls. Primary Dressing: Prisma 4.34 (in) (Generic) 3 x Per Week/30 Days Discharge Instructions: Moisten w/normal saline or sterile water; Cover wound as directed. Do not remove from wound bed. Secondary Dressing: Mepilex Border Flex, 4x4 (in/in) (Generic) 3 x Per Week/30 Days Discharge Instructions: Apply to wound as directed. Do not cut. Electronic Signature(s) Signed: 01/04/2022 3:22:57 PM By: Donnamarie Poag Signed: 01/04/2022 3:27:38 PM By: Worthy Keeler PA-C Entered By: Donnamarie Poag on  01/04/2022 15:11:15 [name], [name]. ([id number]) -------------------------------------------------------------------------------- Problem List Details Patient Name: [name], [name]. Date of Service: 01/04/2022 2:30 PM Medical Record Number: [id number] Patient Account  Number: [id number] Date of Birth/Sex: [birth year] (88 y.o. F) Treating RN: Donnamarie Poag Primary Care Provider: Antony Contras Other Clinician: Referring Provider: Antony Contras Treating Provider/Extender: Skipper Cliche in Treatment: 13 Active Problems ICD-10 Encounter Code Description Active Date MDM Diagnosis L02.31 Cutaneous abscess of buttock 10/05/2021 No Yes L98.412 Non-pressure chronic ulcer of buttock with fat layer exposed 10/05/2021 No Yes L89.152 Pressure ulcer of sacral region, stage 2 12/15/2021 No Yes I10 Essential (primary) hypertension 10/05/2021 No Yes Z79.01 [place] term (current) use of anticoagulants 10/05/2021 No Yes F01.50 Vascular dementia without behavioral disturbance 10/05/2021 No Yes Inactive Problems Resolved Problems Electronic Signature(s) Signed: 01/04/2022 2:42:44 PM By: Worthy Keeler PA-C Entered By: Worthy Keeler on 01/04/2022 14:42:43 [name], [name]. ([id number]) -------------------------------------------------------------------------------- Progress Note Details Patient Name: [name], [name]. Date of Service: 01/04/2022 2:30 PM Medical Record Number: [id number] Patient Account Number: [id number] Date of Birth/Sex: May 25, 1932 (88 y.o. F) Treating RN: Donnamarie Poag Primary Care Provider: Antony Contras Other Clinician: Referring Provider: Antony Contras Treating Provider/Extender: Skipper Cliche in Treatment: 13 Subjective Chief Complaint Information obtained from Patient Sacral pressure ulcer History of Present Illness (HPI) 10/05/2021 patient presents today for a wound that actually began on around July 06, 2021. This appears initially to have been she tells me a blister that came up and  then subsequently open. My concern here is that this was probably more of an abscess than a pressure ulcer she is actually a fairly active individual she does not lay in bed all the time she does ambulate and again I think pressure is a much less likely because of this to be perfectly honest that an abscess. Nonetheless I see no signs of infection right now she does have some slough buildup on the surface of the wound but nothing too significant which is great news. No fevers, chills, nausea, vomiting, or diarrhea. The patient does have a history otherwise of hypertension, [place]-term use of anticoagulant therapy, and she does have some dementia. 10/19/2021 upon evaluation today patient's wound showed some signs of improvement though she continues to have some issues as well with some slough buildup this can require little bit of sharp debridement. She did not have a dressing on today but she had just recently taken a shower. Obviously that is okay but we do want to try to keep this covered is much as possible to help keep it from getting too wet. She is also has been having unfortunately some issues with urinary symptoms frequent urination being the main thing here. Again I do believe that still of utmost concern for her to get checked out if she has a UTI that needs to be addressed sooner rather than later. 11/10/2021 upon evaluation today patient appears to be doing well with regard to her wound from the standpoint of there being no infection unfortunately she does have a lot of moisture and this really is not being changed as far as the dressing is concerned. We have been using collagen but nobody is really been changing the dressings regularly as directed. Nonetheless her niece who is with her today says that she is can work on getting that done appropriately. 12/15/2021 upon evaluation today patient appears to be doing somewhat better in regard to the wound in the sacral region. Fortunately there  does not appear to be any evidence of active infection locally nor systemically at this point she has a small pressure injury just above and to the left of the original wound this is very  superficial but nonetheless present. 01/04/2022 upon evaluation today patient appears to be doing well with regard to her wound. This is showing signs of improvement one of the 2 wounds is completely healed at the original is still open but smaller. Overall I am in agreement that this seems to be heading in the right direction. Objective Constitutional Well-nourished and well-hydrated in no acute distress. Vitals Time Taken: 2:55 PM, Height: 60 in, Weight: 116 lbs, BMI: 22.7, Temperature: 98.4 F, Pulse: 98 bpm, Respiratory Rate: 16 breaths/min, Blood Pressure: 130/84 mmHg. Respiratory normal breathing without difficulty. Psychiatric this patient is able to make decisions and demonstrates good insight into disease process. Alert and Oriented x 3. pleasant and cooperative. General Notes: Upon inspection patient's wound bed actually showed signs of good granulation and epithelization at this point. Fortunately I do not see any evidence of active infection locally nor systemically at this time which is great news and overall I am extremely pleased with where we stand today. Integumentary (Hair, Skin) Wound #1 status is Open. Original cause of wound was Pressure Injury. The date acquired was: 07/06/2021. The wound has been in treatment 13 weeks. The wound is located on the Midline Sacrum. The wound measures 0.8cm length x 0.3cm width x 0.2cm depth; 0.188cm^2 area and [name], [name]. ([id number]) 0.038cm^3 volume. There is Fat Layer (Subcutaneous Tissue) exposed. There is no tunneling noted, however, there is undermining starting at 1:00 and ending at 4:00 with a maximum distance of 0.4cm. There is a medium amount of serous drainage noted. The wound margin is flat and intact. There is large (67-100%) pink granulation  within the wound bed. There is a small (1-33%) amount of necrotic tissue within the wound bed including Adherent Slough. Wound #2 status is Healed - Epithelialized. Original cause of wound was Pressure Injury. The date acquired was: 12/07/2021. The wound has been in treatment 2 weeks. The wound is located on the Proximal,Midline Sacrum. The wound measures 0cm length x 0cm width x 0cm depth; 0cm^2 area and 0cm^3 volume. There is a none present amount of drainage noted. The wound margin is flat and intact. There is no granulation within the wound bed. There is no necrotic tissue within the wound bed. Assessment Active Problems ICD-10 Cutaneous abscess of buttock Non-pressure chronic ulcer of buttock with fat layer exposed Pressure ulcer of sacral region, stage 2 Essential (primary) hypertension [name] term (current) use of anticoagulants Vascular dementia without behavioral disturbance Plan Follow-up Appointments: Return Appointment in 2 weeks. Other: - Call the office with any concerns [phone] Bathing/ Shower/ Hygiene: May shower; gently cleanse wound with antibacterial soap, rinse and pat dry prior to dressing wounds Anesthetic (Use 'Patient Medications' Section for Anesthetic Order Entry): Lidocaine applied to wound bed Off-Loading: Turn and reposition every 2 hours - pt educated on importance to reposition every two hours to help with pressure on sacrum. Cushion for chair to be ordered by family [place] Other: - pt also educated on importance to change dressing every three days as ordered. Patient states daughter helps with dressing changes. Additional Orders / Instructions: Follow Nutritious Diet and Increase Protein Intake WOUND #1: - Sacrum Wound Laterality: Midline Cleanser: Normal Saline (Generic) 3 x Per Week/30 Days Discharge Instructions: Wash your hands with soap and water. Remove old dressing, discard into plastic bag and place into trash. Cleanse the wound with  Normal Saline prior to applying a clean dressing using gauze sponges, not tissues or cotton balls. Do not scrub or use excessive force. [name]  dry using gauze sponges, not tissue or cotton balls. Primary Dressing: Prisma 4.34 (in) (Generic) 3 x Per Week/30 Days Discharge Instructions: Moisten w/normal saline or sterile water; Cover wound as directed. Do not remove from wound bed. Secondary Dressing: Mepilex Border Flex, 4x4 (in/in) (Generic) 3 x Per Week/30 Days Discharge Instructions: Apply to wound as directed. Do not cut. 1. We will continue with the silver alginate dressing which I think is doing a great job for the patient. 2. I am also can recommend that we continue with the border foam dressing to cover. 3. We will also continue with having the patient avoid excessive sitting for Plaut periods of time which again should allow this area to continue to heal appropriately. We will see patient back for reevaluation in 1 week here in the clinic. If anything worsens or changes patient will contact our office for additional recommendations. Electronic Signature(s) Signed: 01/04/2022 3:15:58 PM By: Worthy Keeler PA-C Entered By: Worthy Keeler on 01/04/2022 15:15:58 [name], [name]. ([id number]) -------------------------------------------------------------------------------- SuperBill Details Patient Name: Homewood, Hiliana I. Date of Service: 01/04/2022 Medical Record Number: [id number] Patient Account Number: [id number] Date of Birth/Sex: [birth year] (88 y.o. F) Treating RN: Donnamarie Poag Primary Care Provider: Antony Contras Other Clinician: Referring Provider: Antony Contras Treating Provider/Extender: Skipper Cliche in Treatment: 13 Diagnosis Coding ICD-10 Codes Code Description L02.31 Cutaneous abscess of buttock L98.412 Non-pressure chronic ulcer of buttock with fat layer exposed L89.152 Pressure ulcer of sacral region, stage 2 I10 Essential (primary) hypertension Z79.01 [place] term (current) use  of anticoagulants Facility Procedures CPT4 Code: [id number] Description: 99213 - WOUND CARE VISIT-LEV 3 EST PT Modifier: Quantity: 1 Physician Procedures CPT4 Code: 5956387 Description: 56433 - WC PHYS LEVEL 3 - EST PT Modifier: Quantity: 1 CPT4 Code: Description: ICD-10 Diagnosis Description L02.31 Cutaneous abscess of buttock L98.412 Non-pressure chronic ulcer of buttock with fat layer exposed L89.152 Pressure ulcer of sacral region, stage 2 I10 Essential (primary) hypertension Modifier: Quantity: Electronic Signature(s) Signed: 01/04/2022 3:17:27 PM By: Worthy Keeler PA-C Entered By: Worthy Keeler on 01/04/2022 15:17:26

## 2022-01-04 NOTE — Telephone Encounter (Signed)
Dr. Chryl Heck office is calling to follow up on referral that was placed. Would like a callback on status at [phone]. I don't show that it has been okay by Dr. Kelton Pillar.

## 2022-01-04 NOTE — Progress Notes (Addendum)
[name], [name]. ([id number]) Visit Report for 01/04/2022 Arrival Information Details Patient Name: [name]. Date of Service: 01/04/2022 2:30 PM Medical Record Number: [id number] Patient Account Number: [id number] Date of Birth/Sex: [birth year] (88 y.o. F) Treating RN: Donnamarie Poag Primary Care Gabrianna Fassnacht: Antony Contras Other Clinician: Referring Jaiceon Collister: Antony Contras Treating Alin Hutchins/Extender: Skipper Cliche in Treatment: 13 Visit Information History Since Last Visit Added or deleted any medications: No Patient Arrived: [name] Had a fall or experienced change in No Arrival Time: 14:52 activities of daily living that may affect Accompanied By: neice risk of falls: Transfer Assistance: EasyPivot Patient Lift Hospitalized since last visit: No Patient Identification Verified: Yes Has Dressing in Place as Prescribed: Yes Secondary Verification Process Completed: Yes Pain Present Now: No Patient Requires Transmission-Based No Precautions: Patient Has Alerts: Yes Patient Alerts: Patient on Blood Thinner NOT DIABETIC ELIOQUIS Electronic Signature(s) Signed: 01/04/2022 3:22:57 PM By: Donnamarie Poag Entered By: Donnamarie Poag on 01/04/2022 14:53:57 [name], [name]. ([id number]) -------------------------------------------------------------------------------- Clinic Level of Care Assessment Details Patient Name: [name]. Date of Service: 01/04/2022 2:30 PM Medical Record Number: [id number] Patient Account Number: [id number] Date of Birth/Sex: August 02, 1932 (88 y.o. F) Treating RN: Donnamarie Poag Primary Care Deliyah Muckle: Antony Contras Other Clinician: Referring Pierrette Scheu: Antony Contras Treating Chayce Robbins/Extender: Skipper Cliche in Treatment: 13 Clinic Level of Care Assessment Items TOOL 4 Quantity Score _0  - Use when only an EandM is performed on FOLLOW-UP visit 0 ASSESSMENTS - Nursing Assessment / Reassessment _1  - Reassessment of Co-morbidities (includes updates in patient status) 0 _2  -  0 Reassessment of Adherence to Treatment Plan ASSESSMENTS - Wound and Skin Assessment / Reassessment _3  - Simple Wound Assessment / Reassessment - one wound 0 X- 2 5 Complex Wound Assessment / Reassessment - multiple wounds _4  - 0 Dermatologic / Skin Assessment (not related to wound area) ASSESSMENTS - Focused Assessment _5  - Circumferential Edema Measurements - multi extremities 0 _6  - 0 Nutritional Assessment / Counseling / Intervention _7  - 0 Lower Extremity Assessment (monofilament, tuning fork, pulses) _8  - 0 Peripheral Arterial Disease Assessment (using hand held doppler) ASSESSMENTS - Ostomy and/or Continence Assessment and Care _9  - Incontinence Assessment and Management 0 _10  - 0 Ostomy Care Assessment and Management (repouching, etc.) PROCESS - Coordination of Care X - Simple Patient / Family Education for ongoing care 1 15 _11  - 0 Complex (extensive) Patient / Family Education for ongoing care _12  - 0 Staff obtains Programmer, systems, Records, Test Results / Process Orders _13  - 0 Staff telephones HHA, Nursing Homes / Clarify orders / etc _14  - 0 Routine Transfer to another Facility (non-emergent condition) _15  - 0 Routine Hospital Admission (non-emergent condition) _16  - 0 New Admissions / Biomedical engineer / Ordering NPWT, Apligraf, etc. _17  - 0 Emergency Hospital Admission (emergent condition) X- 1 10 Simple Discharge Coordination _18  - 0 Complex (extensive) Discharge Coordination PROCESS - Special Needs _19  - Pediatric / Minor Patient Management 0 _20  - 0 Isolation Patient Management _21  - 0 Hearing / Language / Visual special needs _22  - 0 Assessment of Community assistance (transportation, D/C planning, etc.) _23  - 0 Additional assistance / Altered mentation _24  - 0 Support Surface(s) Assessment (bed, cushion, seat, etc.) INTERVENTIONS - Wound Cleansing / Measurement [name]. ([id number]) _25  - 0 Simple Wound Cleansing - one wound X- 2 5 Complex Wound  Cleansing - multiple wounds _26  - 0 Wound Imaging (photographs - any number of wounds) _27  - 0 Wound Tracing (instead of photographs) _28  - 0 Simple Wound Measurement -  one wound X- 2 5 Complex Wound Measurement - multiple wounds INTERVENTIONS - Wound Dressings X - Small Wound Dressing one or multiple wounds 2 10 _0  - 0 Medium Wound Dressing one or multiple wounds _1  - 0 Large Wound Dressing one or multiple wounds X- 1 5 Application of Medications - topical <YQMVHQIONGEXBMWU>_1<\/LKGMWNUUVOZDGUYQ>_0  - 0 Application of Medications - injection INTERVENTIONS - Miscellaneous _3  - External ear exam 0 _4  - 0 Specimen Collection (cultures, biopsies, blood, body fluids, etc.) _5  - 0 Specimen(s) / Culture(s) sent or taken to Lab for analysis _6  - 0 Patient Transfer (multiple staff / Civil Service fast streamer / Similar devices) _7  - 0 Simple Staple / Suture removal (25 or less) _8  - 0 Complex Staple / Suture removal (26 or more) _9  - 0 Hypo / Hyperglycemic Management (close monitor of Blood Glucose) _10  - 0 Ankle / Brachial Index (ABI) - do not check if billed separately X- 1 5 Vital Signs Has the patient been seen at the hospital within the last three years: Yes Total Score: 85 Level Of Care: New/Established - Level 3 Electronic Signature(s) Signed: 01/04/2022 3:22:57 PM By: Donnamarie Poag Entered By: Donnamarie Poag on 01/04/2022 15:15:14 [name], [name]. ([id number]) -------------------------------------------------------------------------------- Encounter Discharge Information Details Patient Name: Lobello, Alahia [name]. Date of Service: 01/04/2022 2:30 PM Medical Record Number: [id number] Patient Account Number: [id number] Date of Birth/Sex: June 25, 1932 (88 y.o. F) Treating RN: Donnamarie Poag Primary Care Yenni Carra: Antony Contras Other Clinician: Referring Can Lucci: Antony Contras Treating Paris Hohn/Extender: Skipper Cliche in Treatment: 13 Encounter Discharge Information Items Discharge Condition: Stable Ambulatory Status: Cane Discharge  Destination: Home Transportation: Private Auto Accompanied By: Saddie Benders Schedule Follow-up Appointment: Yes Clinical Summary of Care: Electronic Signature(s) Signed: 01/04/2022 3:22:57 PM By: Donnamarie Poag Entered By: Donnamarie Poag on 01/04/2022 15:18:36 [name], [name]. ([id number]) -------------------------------------------------------------------------------- Lower Extremity Assessment Details Patient Name: Magnan, Disha [name]. Date of Service: 01/04/2022 2:30 PM Medical Record Number: [id number] Patient Account Number: [id number] Date of Birth/Sex: Feb 21, 1932 (88 y.o. F) Treating RN: Donnamarie Poag Primary Care Marsha Hillman: Antony Contras Other Clinician: Referring Vika Buske: Antony Contras Treating Beverlyn Mcginness/Extender: Skipper Cliche in Treatment: 13 Electronic Signature(s) Signed: 01/04/2022 3:22:57 PM By: Donnamarie Poag Entered By: Donnamarie Poag on 01/04/2022 15:03:55 Reigel, Afra [name]. ([id number]) -------------------------------------------------------------------------------- Multi Wound Chart Details Patient Name: Barnhart, Emilianna [name]. Date of Service: 01/04/2022 2:30 PM Medical Record Number: [id number] Patient Account Number: [id number] Date of Birth/Sex: 07/10/1932 (88 y.o. F) Treating RN: Donnamarie Poag Primary Care Lorence Nagengast: Antony Contras Other Clinician: Referring Vivian Neuwirth: Antony Contras Treating Linh Hedberg/Extender: Skipper Cliche in Treatment: 13 Vital Signs Height(in): 60 Pulse(bpm): 74 Weight(lbs): 116 Blood Pressure(mmHg): 130/84 Body Mass Index(BMI): 22.7 Temperature(F): 98.4 Respiratory Rate(breaths/min): 16 Photos: [N/A:N/A] Wound Location: Midline Sacrum Proximal, Midline Sacrum N/A Wounding Event: Pressure Injury Pressure Injury N/A Primary Etiology: Abscess Pressure Ulcer N/A Comorbid History: Hypertension, Peripheral Venous Hypertension, Peripheral Venous N/A Disease, Received Chemotherapy Disease, Received Chemotherapy Date Acquired: 07/06/2021 12/07/2021 N/A Weeks of Treatment: 13 2  N/A Wound Status: Open Open N/A Wound Recurrence: No No N/A Measurements L x W x D (cm) 0.8x0.3x0.2 0.1x0.1x0.1 N/A Area (cm) : 0.188 0.008 N/A Volume (cm) : 0.038 0.001 N/A % Reduction in Area: 96.60% 96.60% N/A % Reduction in Volume: 96.50% 95.80% N/A Starting Position 1 (o'clock): 1 Ending Position 1 (o'clock): 4 Maximum Distance 1 (cm): 0.4 Undermining: Yes N/A N/A Classification: Full Thickness Without Exposed Category/Stage II N/A Support Structures Exudate Amount: Medium None Present N/A Exudate Type: Serous N/A N/A Exudate Color: amber N/A N/A Wound Margin: Flat  and Intact Flat and Intact N/A Granulation Amount: Large (67-100%) None Present (0%) N/A Granulation Quality: Pink N/A N/A Necrotic Amount: Small (1-33%) None Present (0%) N/A Exposed Structures: Fat Layer (Subcutaneous Tissue): Fascia: No N/A Yes Fat Layer (Subcutaneous Tissue): Fascia: No No Tendon: No Tendon: No Muscle: No Muscle: No Joint: No Joint: No Bone: No Bone: No Epithelialization: None N/A N/A Treatment Notes Electronic Signature(s) Signed: 01/04/2022 3:22:57 PM By: Ellis Savage, Maryagnes [name]. ([id number]) Entered By: Donnamarie Poag on 01/04/2022 15:09:34 [name], [name]. ([id number]) -------------------------------------------------------------------------------- Multi-Disciplinary Care Plan Details Patient Name: [name], [name]. Date of Service: 01/04/2022 2:30 PM Medical Record Number: [id number] Patient Account Number: [id number] Date of Birth/Sex: [birth year] (88 y.o. F) Treating RN: Donnamarie Poag Primary Care Alson Mcpheeters: Antony Contras Other Clinician: Referring Aleksandar Duve: Antony Contras Treating Roy Snuffer/Extender: Skipper Cliche in Treatment: 13 Active Inactive Wound/Skin Impairment Nursing Diagnoses: Impaired tissue integrity Knowledge deficit related to ulceration/compromised skin integrity Goals: Ulcer/skin breakdown will have a volume reduction of 30% by week 4 Date Initiated:  10/05/2021 Date Inactivated: 01/04/2022 Target Resolution Date: 10/30/2021 Goal Status: Met Ulcer/skin breakdown will have a volume reduction of 50% by week 8 Date Initiated: 10/05/2021 Target Resolution Date: 11/27/2021 Goal Status: Active Ulcer/skin breakdown will have a volume reduction of 80% by week 12 Date Initiated: 10/05/2021 Target Resolution Date: 12/25/2021 Goal Status: Active Ulcer/skin breakdown will heal within 14 weeks Date Initiated: 10/05/2021 Target Resolution Date: 01/08/2022 Goal Status: Active Interventions: Assess patient/caregiver ability to obtain necessary supplies Provide education on ulcer and skin care Treatment Activities: Referred to DME Mouhamad Teed for dressing supplies : 10/05/2021 Topical wound management initiated : 10/05/2021 Notes: Electronic Signature(s) Signed: 01/04/2022 3:22:57 PM By: Donnamarie Poag Entered By: Donnamarie Poag on 01/04/2022 15:04:23 [name], [name]. ([id number]) -------------------------------------------------------------------------------- Pain Assessment Details Patient Name: Masin, Keosha [name]. Date of Service: 01/04/2022 2:30 PM Medical Record Number: [id number] Patient Account Number: [id number] Date of Birth/Sex: 01-01-1932 (88 y.o. F) Treating RN: Donnamarie Poag Primary Care Menachem Urbanek: Antony Contras Other Clinician: Referring Clayton Bosserman: Antony Contras Treating Shaunak Kreis/Extender: Skipper Cliche in Treatment: 13 Active Problems Location of Pain Severity and Description of Pain Patient Has Paino No Site Locations Rate the pain. Current Pain Level: 0 Pain Management and Medication Current Pain Management: Electronic Signature(s) Signed: 01/04/2022 3:22:57 PM By: Donnamarie Poag Entered By: Donnamarie Poag on 01/04/2022 14:55:52 [name], [name]. ([id number]) -------------------------------------------------------------------------------- Patient/Caregiver Education Details Patient Name: Gobert, Karthika [name]. Date of Service: 01/04/2022 2:30 PM Medical Record  Number: [id number] Patient Account Number: [id number] Date of Birth/Gender: [birth year] (88 y.o. F) Treating RN: Donnamarie Poag Primary Care Physician: Antony Contras Other Clinician: Referring Physician: Antony Contras Treating Physician/Extender: Skipper Cliche in Treatment: 13 Education Assessment Education Provided To: Patient Education Topics Provided Basic Hygiene: Nutrition: Offloading: Wound/Skin Impairment: Electronic Signature(s) Signed: 01/04/2022 3:22:57 PM By: Donnamarie Poag Entered By: Donnamarie Poag on 01/04/2022 15:17:52 [name], [name]. ([id number]) -------------------------------------------------------------------------------- Wound Assessment Details Patient Name: [name], [name]. Date of Service: 01/04/2022 2:30 PM Medical Record Number: [id number] Patient Account Number: [id number] Date of Birth/Sex: October 23, 1932 (88 y.o. F) Treating RN: Donnamarie Poag Primary Care Keysean Savino: Antony Contras Other Clinician: Referring Jayveion Stalling: Antony Contras Treating Leandre Wien/Extender: Skipper Cliche in Treatment: 13 Wound Status Wound Number: 1 Primary Abscess Etiology: Wound Location: Midline Sacrum Wound Status: Open Wounding Event: Pressure Injury Comorbid Hypertension, Peripheral Venous Disease, Received Date Acquired: 07/06/2021 History: Chemotherapy Weeks Of Treatment: 13 Clustered Wound: No Photos Wound Measurements Length: (cm) 0.8 Width: (cm) 0.3 Depth: (cm) 0.2 Area: (cm) 0.188 Volume: (cm)  0.038 % Reduction in Area: 96.6% % Reduction in Volume: 96.5% Epithelialization: None Tunneling: No Undermining: Yes Starting Position (o'clock): 1 Ending Position (o'clock): 4 Maximum Distance: (cm) 0.4 Wound Description Classification: Full Thickness Without Exposed Support Structu Wound Margin: Flat and Intact Exudate Amount: Medium Exudate Type: Serous Exudate Color: amber res Foul Odor After Cleansing: No Slough/Fibrino Yes Wound Bed Granulation Amount: Large (67-100%)  Exposed Structure Granulation Quality: Pink Fascia Exposed: No Necrotic Amount: Small (1-33%) Fat Layer (Subcutaneous Tissue) Exposed: Yes Necrotic Quality: Adherent Slough Tendon Exposed: No Muscle Exposed: No Joint Exposed: No Bone Exposed: No Treatment Notes Wound #1 (Sacrum) Wound Laterality: Midline Cleanser [name], [name]. ([id number]) Normal Saline Discharge Instruction: Wash your hands with soap and water. Remove old dressing, discard into plastic bag and place into trash. Cleanse the wound with Normal Saline prior to applying a clean dressing using gauze sponges, not tissues or cotton balls. Do not scrub or use excessive force. Pat dry using gauze sponges, not tissue or cotton balls. Peri-Wound Care Topical Primary Dressing Prisma 4.34 (in) Discharge Instruction: Moisten w/normal saline or sterile water; Cover wound as directed. Do not remove from wound bed. Secondary Dressing Mepilex Border Flex, 4x4 (in/in) Discharge Instruction: Apply to wound as directed. Do not cut. Secured With Compression Wrap Compression Stockings Environmental education officer) Signed: 01/04/2022 3:22:57 PM By: Donnamarie Poag Entered By: Donnamarie Poag on 01/04/2022 15:01:45 [name], [name]. ([id number]) -------------------------------------------------------------------------------- Wound Assessment Details Patient Name: Farrington, Marcelina [name]. Date of Service: 01/04/2022 2:30 PM Medical Record Number: [id number] Patient Account Number: [id number] Date of Birth/Sex: 05-16-1932 (88 y.o. F) Treating RN: Donnamarie Poag Primary Care Ashlee Bewley: Antony Contras Other Clinician: Referring Shaguana Love: Antony Contras Treating Kadeisha Betsch/Extender: Skipper Cliche in Treatment: 13 Wound Status Wound Number: 2 Primary Pressure Ulcer Etiology: Wound Location: Proximal, Midline Sacrum Wound Status: Healed - Epithelialized Wounding Event: Pressure Injury Comorbid Hypertension, Peripheral Venous Disease, Received Date Acquired:  12/07/2021 History: Chemotherapy Weeks Of Treatment: 2 Clustered Wound: No Photos Wound Measurements Length: (cm) 0 % Red Width: (cm) 0 % Red Depth: (cm) 0 Area: (cm) 0 Volume: (cm) 0 uction in Area: 100% uction in Volume: 100% Wound Description Classification: Category/Stage II Foul Wound Margin: Flat and Intact Slou Exudate Amount: None Present Odor After Cleansing: No gh/Fibrino No Wound Bed Granulation Amount: None Present (0%) Exposed Structure Necrotic Amount: None Present (0%) Fascia Exposed: No Fat Layer (Subcutaneous Tissue) Exposed: No Tendon Exposed: No Muscle Exposed: No Joint Exposed: No Bone Exposed: No Treatment Notes Wound #2 (Sacrum) Wound Laterality: Midline, Proximal Cleanser Peri-Wound Care Topical Primary Dressing Situ, Daphne [name]. ([id number]) Secondary Dressing Secured With Compression Wrap Compression Stockings Add-Ons Electronic Signature(s) Signed: 01/04/2022 3:22:57 PM By: Donnamarie Poag Entered By: Donnamarie Poag on 01/04/2022 15:09:57 [name], [name]. ([id number]) -------------------------------------------------------------------------------- Vitals Details Patient Name: Chovanec, Jerilee [name]. Date of Service: 01/04/2022 2:30 PM Medical Record Number: [id number] Patient Account Number: [id number] Date of Birth/Sex: Nov 05, 1932 (88 y.o. F) Treating RN: Donnamarie Poag Primary Care Britny Riel: Antony Contras Other Clinician: Referring Clarisa Danser: Antony Contras Treating Lismary Kiehn/Extender: Skipper Cliche in Treatment: 13 Vital Signs Time Taken: 14:55 Temperature (F): 98.4 Height (in): 60 Pulse (bpm): 98 Weight (lbs): 116 Respiratory Rate (breaths/min): 16 Body Mass Index (BMI): 22.7 Blood Pressure (mmHg): 130/84 Reference Range: 80 - 120 mg / dl Electronic Signature(s) Signed: 01/04/2022 3:22:57 PM By: Donnamarie Poag Entered ByDonnamarie Poag on 01/04/2022 14:55:43

## 2022-01-04 NOTE — Telephone Encounter (Signed)
Notified son of message below.  LM for Dr [facility] office to follow up on referral to Endocrinology

## 2022-01-04 NOTE — Telephone Encounter (Signed)
-----   Message from Merril Abbe, LPN sent at 1/85/9093  2:13 PM EST -----  ----- Message ----- From: Benay Pike, MD Sent: 12/30/2021   8:27 AM EST To: Merril Abbe, LPN  Thyroid biopsy so far, no definitive evidence of cancer. But there appears to be more testing going on. Could you please see, if she can be scheduled with endocrinology for further monitoring and follow up. I think we have previously scheduled endocrinology referral.  Thanks,

## 2022-01-06 ENCOUNTER — Encounter (HOSPITAL_BASED_OUTPATIENT_CLINIC_OR_DEPARTMENT_OTHER): Payer: Medicare HMO | Admitting: Physician Assistant

## 2022-01-07 ENCOUNTER — Other Ambulatory Visit: Payer: Medicare HMO

## 2022-01-07 ENCOUNTER — Ambulatory Visit: Payer: Medicare HMO | Admitting: Hematology and Oncology

## 2022-01-11 ENCOUNTER — Encounter: Payer: Self-pay | Admitting: Hematology and Oncology

## 2022-01-11 NOTE — Progress Notes (Signed)
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, lymph nodes of head, face, and neck  Cancer Staging:  Cancer Staging  Diffuse large B-cell lymphoma of lymph nodes of neck ([place]) Staging form: Hodgkin and Non-Hodgkin Lymphoma, AJCC 8th Edition - Clinical stage from 12/11/2021: Stage IV (Diffuse large B-cell lymphoma) - Signed by Eppie Gibson, MD on 12/11/2021 Stage prefix: Initial diagnosis  Intent: Curative  Radiation Treatment Dates: 12/21/2021 through 01/01/2022 Site Technique Total Dose (Gy) Dose per Fx (Gy) Completed Fx Beam Energies  Nasopharynx: HN_NasoP 3D 25/25 2.5 10/10 6X   Narrative: The patient tolerated radiation therapy relatively well with reduction in tumor bulk noted on daily cone beam CT imaging.  Plan: The patient will follow-up with radiation oncology in half a month. -----------------------------------  Eppie Gibson, MD

## 2022-01-12 ENCOUNTER — Encounter (HOSPITAL_COMMUNITY): Payer: Self-pay

## 2022-01-14 ENCOUNTER — Encounter: Payer: Self-pay | Admitting: Hematology and Oncology

## 2022-01-14 ENCOUNTER — Other Ambulatory Visit: Payer: Self-pay

## 2022-01-14 ENCOUNTER — Inpatient Hospital Stay: Payer: Medicare HMO

## 2022-01-14 ENCOUNTER — Inpatient Hospital Stay (HOSPITAL_BASED_OUTPATIENT_CLINIC_OR_DEPARTMENT_OTHER): Payer: Medicare HMO | Admitting: Hematology and Oncology

## 2022-01-14 VITALS — BP 122/65 | HR 79 | Temp 97.7°F | Wt 106.3 lb

## 2022-01-14 DIAGNOSIS — E042 Nontoxic multinodular goiter: Secondary | ICD-10-CM | POA: Insufficient documentation

## 2022-01-14 DIAGNOSIS — I82411 Acute embolism and thrombosis of right femoral vein: Secondary | ICD-10-CM | POA: Insufficient documentation

## 2022-01-14 DIAGNOSIS — C8331 Diffuse large B-cell lymphoma, lymph nodes of head, face, and neck: Secondary | ICD-10-CM | POA: Insufficient documentation

## 2022-01-14 DIAGNOSIS — R600 Localized edema: Secondary | ICD-10-CM | POA: Diagnosis not present

## 2022-01-14 DIAGNOSIS — I10 Essential (primary) hypertension: Secondary | ICD-10-CM | POA: Insufficient documentation

## 2022-01-14 DIAGNOSIS — D509 Iron deficiency anemia, unspecified: Secondary | ICD-10-CM | POA: Diagnosis not present

## 2022-01-14 DIAGNOSIS — C8330 Diffuse large B-cell lymphoma, unspecified site: Secondary | ICD-10-CM

## 2022-01-14 DIAGNOSIS — Z7901 Long term (current) use of anticoagulants: Secondary | ICD-10-CM | POA: Insufficient documentation

## 2022-01-14 LAB — T4, FREE: Free T4: 1.02 ng/dL (ref 0.61–1.12)

## 2022-01-14 LAB — COMPREHENSIVE METABOLIC PANEL
ALT: 23 U/L (ref 0–44)
AST: 22 U/L (ref 15–41)
Albumin: 3.6 g/dL (ref 3.5–5.0)
Alkaline Phosphatase: 104 U/L (ref 38–126)
Anion gap: 7 (ref 5–15)
BUN: 20 mg/dL (ref 8–23)
CO2: 29 mmol/L (ref 22–32)
Calcium: 9 mg/dL (ref 8.9–10.3)
Chloride: 107 mmol/L (ref 98–111)
Creatinine, Ser: 0.58 mg/dL (ref 0.44–1.00)
GFR, Estimated: >60 mL/min (ref >60)
Glucose, Bld: 90 mg/dL (ref 70–99)
Potassium: 3.9 mmol/L (ref 3.5–5.1)
Sodium: 143 mmol/L (ref 135–145)
Total Bilirubin: 0.4 mg/dL (ref 0.3–1.2)
Total Protein: 6.3 g/dL — ABNORMAL LOW (ref 6.5–8.1)

## 2022-01-14 LAB — CBC WITH DIFFERENTIAL/PLATELET
Abs Immature Granulocytes: 0.01 10*3/uL (ref 0.00–0.07)
Basophils Absolute: 0 10*3/uL (ref 0.0–0.1)
Basophils Relative: 0 %
Eosinophils Absolute: 0.2 10*3/uL (ref 0.0–0.5)
Eosinophils Relative: 5 %
HCT: 30.6 % — ABNORMAL LOW (ref 36.0–46.0)
Hemoglobin: 9.9 g/dL — ABNORMAL LOW (ref 12.0–15.0)
Immature Granulocytes: 0 %
Lymphocytes Relative: 39 %
Lymphs Abs: 1.8 10*3/uL (ref 0.7–4.0)
MCH: 22.7 pg — ABNORMAL LOW (ref 26.0–34.0)
MCHC: 32.4 g/dL (ref 30.0–36.0)
MCV: 70 fL — ABNORMAL LOW (ref 80.0–100.0)
Monocytes Absolute: 0.4 10*3/uL (ref 0.1–1.0)
Monocytes Relative: 8 %
Neutro Abs: 2.2 10*3/uL (ref 1.7–7.7)
Neutrophils Relative %: 48 %
Platelets: 224 10*3/uL (ref 150–400)
RBC: 4.37 MIL/uL (ref 3.87–5.11)
RDW: 16.9 % — ABNORMAL HIGH (ref 11.5–15.5)
WBC: 4.6 10*3/uL (ref 4.0–10.5)
nRBC: 0 % (ref 0.0–0.2)

## 2022-01-14 LAB — LACTATE DEHYDROGENASE: LDH: 210 U/L — ABNORMAL HIGH (ref 98–192)

## 2022-01-14 LAB — FERRITIN: Ferritin: 222 ng/mL (ref 11–307)

## 2022-01-14 NOTE — Progress Notes (Signed)
[name] NOTE  Patient Care Team: Antony Contras, MD as PCP - General (Family Medicine) Benay Pike, MD as Consulting Physician (Hematology and Oncology) Eppie Gibson, MD as Consulting Physician (Radiation Oncology) Malmfelt, Stephani Police, RN as Oncology Nurse Navigator  CHIEF COMPLAINTS/PURPOSE OF CONSULTATION:  DLBCL of nasopharynx.  ASSESSMENT & PLAN:   GCB subtype, DLBCL of nasopharynx, Stage IV  Diffuse large B-cell lymphoma (HCC) This is a very pleasant 88 year old female patient with diffuse large B-cell lymphoma of the nasopharynx with a metastatic disease noticed on imaging who is here for a follow-up after completing 6 cycles of mini RCHOP.   She had excellent response in all metastatic sites however had residual disease at the nasopharynx on most recent PET/CT. Given her age, mentation and quality of life, I have recommended considering palliative radiation to the nasopharynx followed by repeat imaging and surveillance. She was discussed in [facility], and she was not thought to have any additional disease in oropharynx, according to our radiologist in [facility], She doesn't have disease in oropharynx, its the same disease in her nasopharynx that we are already familiar with. She completed palliative radiation from 12/21/2021 through 01/01/2022. No concerning review of systems or physical examination findings today. CBC, CMP and LDH today.  PET/CT anticipated February 11, 2022  She will return to clinic after PET/CT to review results and to discuss any additional recommendations  Microcytic anemia, last hemoglobin around 10 g/dL. I wonder if she has thalassemia trait.  We will continue to monitor this on labs today. CBC and ferritin ordered for today.  Bilateral lower extremity edema, unrelated to the lymphoma.  This has significantly improved since her last visit.  Recommended elevating legs and using compression socks.  DVT of right femoral vein,  on anticoagulation.  No adverse effects with anticoagulation.  Continue anticoagulation at this time.  Thyroid nodule, status postbiopsy one of the nodules was consistent with benign follicular nodule.  The nodule showed atypia of undetermined significance, Afirma testing done which showed benign ( risk of malignancy 4%) She also has an endocrinology follow-up at the end of March.  Return end of March to review PET/CT results. No orders of the defined types were placed in this encounter.   HISTORY OF PRESENTING ILLNESS:   [name] 88 y.o. female is here because of new diagnosis of DLBCL of nasopharynx.  [name] is a very pleasant 88 year old female patient who arrived today with her granddaughter [name] to the appointment.  She does not quite remember things as explained and has short-term memory loss.  After a bit of the conversation, she did admit to me that she was told about a cancer in her nose which was biopsied last week but she cannot remember the details.  [name] mentioned that for the past year or so grandma has been having more nasal drainage and has progressively lost weight in the past couple months of at least 20 pounds, has become relatively more sedentary and inactive, has no appetite.  They initially thought she might be having a stroke when she was not getting out of bed for 3days and has not been eating.  When she was in the ER.  She had some imaging CT head without contrast which showed left maxillary and ethmoid sinus disease. She then had MRI brain on the same day which showed 3.2 x 2 x 3.5 cm heterogeneous mass positioned at the left nasopharynx near the fossa  of [place].  Finding is most concerning for a primary nasopharyngeal neoplasm.  ENT consultation was recommended.  She then went on to see [name].She had nasal endoscopy with biopsy of left nasopharyngeal mass.  According to [name], polypoid mass lesion was noted to be lymphoma from the  posterior aspect of the left middle turbinate.  Pathology showed findings consistent with diffuse large B-cell lymphoma of the left nasopharyngeal mass as well as left middle turbinate.   PET/CT showed hypermetabolic left nasopharyngeal mass, no associated neck adenopathy, 2 large hypermetabolic liver lesions, destructive lytic metastatic bone lesions.  Since there was no lymphadenopathy and no unusual appearance of spleen, second biopsy was recommended to confirm the diagnosis.  Left ilium  Biopsy showed findings consistent with involvement by non-Hodgkin B-cell lymphoma.  Features are similar to previously known large B-cell lymphoma consistent with involvement by the same disease process.  Cycle day 1 of R mini CHOP on 06/03/2021. C2D1 06/23/2021 Cycle 3-day 1 completed on July 15, 2021 She had an interim PET/CT which showed remarkable response. C4D1 on 08/05/2021 C5D1 on 08/26/2021 C6D1 09/18/2021 PET/CT 10/23/2021 showed persistent hypermetabolic thickening in the posterior left nasopharynx, otherwise interval decrease in size of hepatic lesions without metabolic activity, no metabolic activity in the lytic skeletal lesions.  [place] segment of mild metabolic activity in the esophagus suggest benign esophagitis.  She completed radiation to the nasopharynx on 01/01/2022.  Interval History  She is doing well since completion of radiation. She lost 2 lbs likely because of some mucositis, and dry mouth, but appetite is better, eating well. She denies any fevers, drenching night sweats.  No change in breathing. BLE swelling significantly improved. She has been taking her anticoagulation. No bleeding issues. She has a follow up with endocrinology March 24. 2023 PET scheduled for 3/23.  MEDICAL HISTORY:  Past Medical History:  Diagnosis Date   Anemia    Arthritis    knees    Cancer ([place])    GERD (gastroesophageal reflux disease)    Hiatal hernia    History of inguinal hernia repair     BIH   Hyperlipidemia    Hypertension    Memory problem     SURGICAL HISTORY: Past Surgical History:  Procedure Laterality Date   COLONOSCOPY     HERNIA REPAIR  10/13/11   lap BIH rep w/ mesh- Dr. Tora Kindred HERNIA REPAIR  10/13/2011   Procedure: LAPAROSCOPIC INGUINAL HERNIA;  Surgeon: Adin Hector, MD;  Location: [facility];  Service: General;  Laterality: Bilateral;  Laparoscopic bilateral inguinal hernia repair, converted to open bilateral inguinal hernia repairs with mesh   IR IMAGING GUIDED PORT INSERTION  05/06/2021   NASAL ENDOSCOPY Left 04/08/2021   Procedure: NASAL ENDOSCOPY WITH BIOPSY OF LEFT NASOPPHARYNGEAL MASS;  Surgeon: Jason Coop, DO;  Location: [facility];  Service: ENT;  Laterality: Left;  Frozen Section    SOCIAL HISTORY: Social History   Socioeconomic History   Marital status: Single    Spouse name: Not on file   Number of children: Not on file   Years of education: Not on file   Highest education level: Not on file  Occupational History   Not on file  Tobacco Use   Smoking status: Never   Smokeless tobacco: Never  Vaping Use   Vaping Use: Never used  Substance and Sexual Activity   Alcohol use: No   Drug use: No   Sexual activity: Not Currently  Other Topics Concern  Not on file  Social History Narrative   Not on file   Social Determinants of Health   Financial Resource Strain: Not on file  Food Insecurity: Not on file  Transportation Needs: Not on file  Physical Activity: Not on file  Stress: Not on file  Social Connections: Not on file  Intimate Partner Violence: Not on file    FAMILY HISTORY: Family History  Problem Relation Age of Onset   Heart disease Mother     ALLERGIES:  has No Known Allergies.  MEDICATIONS:  Current Outpatient Medications  Medication Sig Dispense Refill   acetaminophen (TYLENOL) 500 MG tablet Take 1,000 mg by mouth every 6 (six) hours as needed for mild pain or fever.     allopurinol (ZYLOPRIM)  300 MG tablet Take 1 tablet (300 mg total) by mouth daily. 30 tablet 0   amLODipine (NORVASC) 10 MG tablet Take 10 mg by mouth daily.     apixaban (ELIQUIS) 5 MG TABS tablet Take 1 tablet (5 mg total) by mouth 2 (two) times daily. 60 tablet 2   atorvastatin (LIPITOR) 80 MG tablet Take 80 mg by mouth every morning.     ezetimibe (ZETIA) 10 MG tablet Take 10 mg by mouth every morning.     lidocaine (XYLOCAINE) 2 % solution Patient: Mix 1part 2% viscous lidocaine, 1part H20. Swish & swallow 52mL of diluted mixture, 32min before meals and at bedtime, up to QID 100 mL 4   LORazepam (ATIVAN) 0.5 MG tablet Take 1 tablet by mouth at bedtime as needed.     memantine (NAMENDA) 10 MG tablet Take 1/2 tablet (5 mg) for 1 week at bedtime, then 1 tablet (10 mg) at bedtime. 27 tablet 0   memantine (NAMENDA) 10 MG tablet Take 1 tablet (10 mg total) by mouth at bedtime. 90 tablet 1   No current facility-administered medications for this visit.    PHYSICAL EXAMINATION:  ECOG PERFORMANCE STATUS: 1/2  Physical Exam Constitutional:      Appearance: Normal appearance.  HENT:     Head: Normocephalic and atraumatic.  Eyes:     Pupils: Pupils are equal, round, and reactive to light.  Neck:     Comments: Palpable thyroid nodules on exam Cardiovascular:     Rate and Rhythm: Normal rate and regular rhythm.     Pulses: Normal pulses.     Heart sounds: Normal heart sounds.  Pulmonary:     Effort: Pulmonary effort is normal.     Breath sounds: Normal breath sounds.  Abdominal:     General: Abdomen is flat. Bowel sounds are normal.     Palpations: Abdomen is soft.  Musculoskeletal:        General: Swelling (Swelling improved remarkably) present. No tenderness or deformity.     Cervical back: Normal range of motion and neck supple. No rigidity.  Lymphadenopathy:     Cervical: No cervical adenopathy.  Skin:    General: Skin is warm and dry.  Neurological:     General: No focal deficit present.     Mental  Status: She is alert.  Psychiatric:        Mood and Affect: Mood normal.     LABORATORY DATA:  I have reviewed the data as listed Lab Results  Component Value Date   WBC 8.7 11/26/2021   HGB 10.2 (L) 11/26/2021   HCT 33.0 (L) 11/26/2021   MCV 72.2 (L) 11/26/2021   PLT 255 11/26/2021     Chemistry  Component Value Date/Time   NA 138 11/26/2021 1502   K 3.8 11/26/2021 1502   CL 102 11/26/2021 1502   CO2 28 11/26/2021 1502   BUN 17 11/26/2021 1502   CREATININE 0.70 11/26/2021 1502      Component Value Date/Time   CALCIUM 8.9 11/26/2021 1502   ALKPHOS 113 11/26/2021 1502   AST 16 11/26/2021 1502   ALT 15 11/26/2021 1502   BILITOT 0.4 11/26/2021 1502      RADIOGRAPHIC STUDIES: [place] FNA BX THYROID 1ST LESION AFIRMA  Result Date: 12/24/2021 INDICATION: Indeterminate thyroid nodules EXAM: ULTRASOUND GUIDED FINE NEEDLE ASPIRATION OF INDETERMINATE THYROID NODULE COMPARISON:  [place] 12/11/21 MEDICATIONS: None COMPLICATIONS: None immediate. TECHNIQUE: Informed written consent was obtained from the patient after a discussion of the risks, benefits and alternatives to treatment. Questions regarding the procedure were encouraged and answered. A timeout was performed prior to the initiation of the procedure. Pre-procedural ultrasound scanning demonstrated unchanged size and appearance of the indeterminate nodules within the right lobe and isthmus The procedure was planned. The neck was prepped in the usual sterile fashion, and a sterile drape was applied covering the operative field. A timeout was performed prior to the initiation of the procedure. Local anesthesia was provided with 1% lidocaine. Under direct ultrasound guidance, 5 FNA biopsies were performed of the right lobe nodule with a 27 gauge needle. Multiple ultrasound images were saved for procedural documentation purposes. The samples were prepared and submitted to pathology. Two of these specimens were reserved for Afirma testing. Under  direct ultrasound guidance, 5 FNA biopsies were performed of the isthmus nodule with a 27 gauge needle. Multiple ultrasound images were saved for procedural documentation purposes. The samples were prepared and submitted to pathology. Two of these specimens were reserved for Afirma testing. Limited post procedural scanning was negative for hematoma or additional complication. Dressings were placed. The patient tolerated the above procedures procedure well without immediate postprocedural complication. FINDINGS: Nodule reference number based on prior diagnostic ultrasound: 1 Maximum size: 3.4cm Location: Right; Inferior ACR TI-RADS risk category: TR3 (3 points) Reason for biopsy: meets ACR TI-RADS criteria _________________________________________________________ Nodule reference number based on prior diagnostic ultrasound: 2 Maximum size: 1.7cm Location: Isthmus; Superior ACR TI-RADS risk category: TR4 (4-6 points) Reason for biopsy: meets ACR TI-RADS criteria Ultrasound imaging confirms appropriate placement of the needles within the thyroid nodule. IMPRESSION: 1. Technically successful ultrasound guided fine needle aspiration of right inferior lobe nodule 2. Technically successful ultrasound guided fine needle aspiration of superior isthmus nodule Performed and read by Pasty Spillers, PA-C Electronically Signed   By: Jerilynn Mages.  Shick M.D.   On: 12/24/2021 17:32   [place] FNA BX THYROID 1ST LESION AFIRMA  Result Date: 12/24/2021 INDICATION: Indeterminate thyroid nodules EXAM: ULTRASOUND GUIDED FINE NEEDLE ASPIRATION OF INDETERMINATE THYROID NODULE COMPARISON:  [place] 12/11/21 MEDICATIONS: None COMPLICATIONS: None immediate. TECHNIQUE: Informed written consent was obtained from the patient after a discussion of the risks, benefits and alternatives to treatment. Questions regarding the procedure were encouraged and answered. A timeout was performed prior to the initiation of the procedure. Pre-procedural ultrasound scanning  demonstrated unchanged size and appearance of the indeterminate nodules within the right lobe and isthmus The procedure was planned. The neck was prepped in the usual sterile fashion, and a sterile drape was applied covering the operative field. A timeout was performed prior to the initiation of the procedure. Local anesthesia was provided with 1% lidocaine. Under direct ultrasound guidance, 5 FNA biopsies were performed of the right lobe nodule  with a 27 gauge needle. Multiple ultrasound images were saved for procedural documentation purposes. The samples were prepared and submitted to pathology. Two of these specimens were reserved for Afirma testing. Under direct ultrasound guidance, 5 FNA biopsies were performed of the isthmus nodule with a 27 gauge needle. Multiple ultrasound images were saved for procedural documentation purposes. The samples were prepared and submitted to pathology. Two of these specimens were reserved for Afirma testing. Limited post procedural scanning was negative for hematoma or additional complication. Dressings were placed. The patient tolerated the above procedures procedure well without immediate postprocedural complication. FINDINGS: Nodule reference number based on prior diagnostic ultrasound: 1 Maximum size: 3.4cm Location: Right; Inferior ACR TI-RADS risk category: TR3 (3 points) Reason for biopsy: meets ACR TI-RADS criteria _________________________________________________________ Nodule reference number based on prior diagnostic ultrasound: 2 Maximum size: 1.7cm Location: Isthmus; Superior ACR TI-RADS risk category: TR4 (4-6 points) Reason for biopsy: meets ACR TI-RADS criteria Ultrasound imaging confirms appropriate placement of the needles within the thyroid nodule. IMPRESSION: 1. Technically successful ultrasound guided fine needle aspiration of right inferior lobe nodule 2. Technically successful ultrasound guided fine needle aspiration of superior isthmus nodule Performed  and read by Pasty Spillers, PA-C Electronically Signed   By: Jerilynn Mages.  Shick M.D.   On: 12/24/2021 17:32    All questions were answered. The patient knows to call the clinic with any problems, questions or concerns.  I spent 30 minutes in the care of this patient including reviewing plan of care, labs, discussion about follow-up and treatment plan.  Benay Pike MD

## 2022-01-14 NOTE — Progress Notes (Incomplete)
[name] presents today for follow up after completing radiation to her nasopharynx on 01/01/2022  Pain issues, if any: *** Using a feeding tube?: N/A Weight changes, if any: ***  Swallowing issues, if any: *** Smoking or chewing tobacco? None Using fluoride trays daily? ***N/A Last ENT visit was on: Not since diagnosis  Other notable issues, if any: ***Had F/U with her medical oncologist Dr. Benay Pike yesterday 01/14/22

## 2022-01-15 ENCOUNTER — Ambulatory Visit: Payer: Medicare HMO | Admitting: Radiation Oncology

## 2022-01-15 ENCOUNTER — Telehealth: Payer: Self-pay

## 2022-01-15 NOTE — Telephone Encounter (Signed)
Received VM from patient's son that patient's niece was not able to pick patient up and bring her to her F/U appointment with Dr. Isidore Moos today. He wanted to know if patient should still come in later today, or reschedule.   Updated Dr. Isidore Moos who stated since patient saw Dr. Chryl Heck yesterday 01/14/22, unless patient/son had any new issues or concerns they did not need to come back into the clinic and could follow-up as needed with radiation oncology.   Returned call and spoke with son directly. He stated patient was doing well, and had not indicated to him that she had any lingering issues or concerns. Son stated patient is scheduled for a restaging PET scan in March, and if they have any questions or needs for Dr. Isidore Moos following that appointment, they will reach out to our department. No other needs identified at this time, but son has my direct office number should something arise in the future.

## 2022-01-19 ENCOUNTER — Encounter: Payer: Medicare HMO | Admitting: Physician Assistant

## 2022-01-19 ENCOUNTER — Other Ambulatory Visit: Payer: Self-pay

## 2022-01-19 DIAGNOSIS — L89152 Pressure ulcer of sacral region, stage 2: Secondary | ICD-10-CM | POA: Diagnosis not present

## 2022-01-19 DIAGNOSIS — Z7901 Long term (current) use of anticoagulants: Secondary | ICD-10-CM | POA: Diagnosis not present

## 2022-01-19 DIAGNOSIS — L98412 Non-pressure chronic ulcer of buttock with fat layer exposed: Secondary | ICD-10-CM | POA: Diagnosis not present

## 2022-01-19 DIAGNOSIS — F0150 Vascular dementia without behavioral disturbance: Secondary | ICD-10-CM | POA: Diagnosis not present

## 2022-01-19 DIAGNOSIS — L0231 Cutaneous abscess of buttock: Secondary | ICD-10-CM | POA: Diagnosis not present

## 2022-01-19 DIAGNOSIS — I10 Essential (primary) hypertension: Secondary | ICD-10-CM | POA: Diagnosis not present

## 2022-01-19 DIAGNOSIS — T8189XA Other complications of procedures, not elsewhere classified, initial encounter: Secondary | ICD-10-CM | POA: Diagnosis not present

## 2022-01-19 NOTE — Progress Notes (Addendum)
Marling, Averil I. ([id number]) Visit Report for 01/19/2022 Chief Complaint Document Details Patient Name: [name], [name]. Date of Service: 01/19/2022 3:30 PM Medical Record Number: [id number] Patient Account Number: [id number] Date of Birth/Sex: [birth year] (88 y.o. F) Treating RN: Donnamarie Poag Primary Care Provider: Antony Contras Other Clinician: Referring Provider: Antony Contras Treating Provider/Extender: Skipper Cliche in Treatment: 15 Information Obtained from: Patient Chief Complaint Sacral pressure ulcer Electronic Signature(s) Signed: 01/19/2022 3:45:00 PM By: Worthy Keeler PA-C Entered By: Worthy Keeler on 01/19/2022 15:45:00 [name], [name]. ([id number]) -------------------------------------------------------------------------------- HPI Details Patient Name: [name], [name] Date of Service: 01/19/2022 3:30 PM Medical Record Number: [id number] Patient Account Number: [id number] Date of Birth/Sex: May 09, 1932 (88 y.o. F) Treating RN: Donnamarie Poag Primary Care Provider: Antony Contras Other Clinician: Referring Provider: Antony Contras Treating Provider/Extender: Skipper Cliche in Treatment: 15 History of Present Illness HPI Description: 10/05/2021 patient presents today for a wound that actually began on around July 06, 2021. This appears initially to have been she tells me a blister that came up and then subsequently open. My concern here is that this was probably more of an abscess than a pressure ulcer she is actually a fairly active individual she does not lay in bed all the time she does ambulate and again I think pressure is a much less likely because of this to be perfectly honest that an abscess. Nonetheless I see no signs of infection right now she does have some slough buildup on the surface of the wound but nothing too significant which is great news. No fevers, chills, nausea, vomiting, or diarrhea. The patient does have a history otherwise of hypertension, [place]-term use of  anticoagulant therapy, and she does have some dementia. 10/19/2021 upon evaluation today patient's wound showed some signs of improvement though she continues to have some issues as well with some slough buildup this can require little bit of sharp debridement. She did not have a dressing on today but she had just recently taken a shower. Obviously that is okay but we do want to try to keep this covered is much as possible to help keep it from getting too wet. She is also has been having unfortunately some issues with urinary symptoms frequent urination being the main thing here. Again I do believe that still of utmost concern for her to get checked out if she has a UTI that needs to be addressed sooner rather than later. 11/10/2021 upon evaluation today patient appears to be doing well with regard to her wound from the standpoint of there being no infection unfortunately she does have a lot of moisture and this really is not being changed as far as the dressing is concerned. We have been using collagen but nobody is really been changing the dressings regularly as directed. Nonetheless her niece who is with her today says that she is can work on getting that done appropriately. 12/15/2021 upon evaluation today patient appears to be doing somewhat better in regard to the wound in the sacral region. Fortunately there does not appear to be any evidence of active infection locally nor systemically at this point she has a small pressure injury just above and to the left of the original wound this is very superficial but nonetheless present. 01/04/2022 upon evaluation today patient appears to be doing well with regard to her wound. This is showing signs of improvement one of the 2 wounds is completely healed at the original is still open but smaller. Overall I am in  agreement that this seems to be heading in the right direction. 01/19/2022 upon evaluation today patient appears to be doing excellent in regard  to her wound. Fortunately there is no signs of infection and everything appears to be completely healed today. I am very pleased in this regard. Electronic Signature(s) Signed: 01/19/2022 4:14:06 PM By: Worthy Keeler PA-C Entered By: Worthy Keeler on 01/19/2022 16:14:06 [name], [name]. ([id number]) -------------------------------------------------------------------------------- Physical Exam Details Patient Name: [name], [name] Date of Service: 01/19/2022 3:30 PM Medical Record Number: [id number] Patient Account Number: [id number] Date of Birth/Sex: [birth year] (88 y.o. F) Treating RN: Donnamarie Poag Primary Care Provider: Antony Contras Other Clinician: Referring Provider: Antony Contras Treating Provider/Extender: Skipper Cliche in Treatment: 4 Constitutional Well-nourished and well-hydrated in no acute distress. Respiratory normal breathing without difficulty. Psychiatric this patient is able to make decisions and demonstrates good insight into disease process. Alert and Oriented x 3. pleasant and cooperative. Notes Upon inspection patient's wound bed showed signs of good epithelization at this point. Fortunately I do not see any evidence of active infection locally nor systemically which is great news and overall I think that we are ready for discharge today. Electronic Signature(s) Signed: 01/19/2022 4:14:30 PM By: Worthy Keeler PA-C Entered By: Worthy Keeler on 01/19/2022 16:14:30 [name], [name]. ([id number]) -------------------------------------------------------------------------------- Physician Orders Details Patient Name: [name], [name] Date of Service: 01/19/2022 3:30 PM Medical Record Number: [id number] Patient Account Number: [id number] Date of Birth/Sex: 24-Sep-1932 (88 y.o. F) Treating RN: Donnamarie Poag Primary Care Provider: Antony Contras Other Clinician: Referring Provider: Antony Contras Treating Provider/Extender: Skipper Cliche in Treatment: 15 Verbal / Phone  Orders: No Diagnosis Coding ICD-10 Coding Code Description L02.31 Cutaneous abscess of buttock L98.412 Non-pressure chronic ulcer of buttock with fat layer exposed L89.152 Pressure ulcer of sacral region, stage 2 I10 Essential (primary) hypertension Z79.01 [place] term (current) use of anticoagulants F01.50 Vascular dementia without behavioral disturbance Discharge From [facility] Services o Discharge from [place] [facility] Complete Off-Loading o Turn and reposition every 2 hours o Other: - recommend to keep newly healed skin covered for a week and keep pressure off of the wound Wound Treatment Electronic Signature(s) Signed: 01/19/2022 4:37:47 PM By: Worthy Keeler PA-C Signed: 01/19/2022 4:38:22 PM By: Donnamarie Poag Entered By: Donnamarie Poag on 01/19/2022 16:09:56 [name], [name]. ([id number]) -------------------------------------------------------------------------------- Problem List Details Patient Name: [name], [name]. Date of Service: 01/19/2022 3:30 PM Medical Record Number: [id number] Patient Account Number: [id number] Date of Birth/Sex: 05/26/1932 (88 y.o. F) Treating RN: Donnamarie Poag Primary Care Provider: Antony Contras Other Clinician: Referring Provider: Antony Contras Treating Provider/Extender: Skipper Cliche in Treatment: 15 Active Problems ICD-10 Encounter Code Description Active Date MDM Diagnosis L02.31 Cutaneous abscess of buttock 10/05/2021 No Yes L98.412 Non-pressure chronic ulcer of buttock with fat layer exposed 10/05/2021 No Yes L89.152 Pressure ulcer of sacral region, stage 2 12/15/2021 No Yes I10 Essential (primary) hypertension 10/05/2021 No Yes Z79.01 [place] term (current) use of anticoagulants 10/05/2021 No Yes F01.50 Vascular dementia without behavioral disturbance 10/05/2021 No Yes Inactive Problems Resolved Problems Electronic Signature(s) Signed: 01/19/2022 3:44:54 PM By: Worthy Keeler PA-C Entered By: Worthy Keeler on 01/19/2022  15:44:54 [name], [name]. ([id number]) -------------------------------------------------------------------------------- Progress Note Details Patient Name: [name], [name]. Date of Service: 01/19/2022 3:30 PM Medical Record Number: [id number] Patient Account Number: [id number] Date of Birth/Sex: [birth year] (88 y.o. F) Treating RN: Donnamarie Poag Primary Care Provider: Antony Contras Other Clinician: Referring Provider: Antony Contras Treating Provider/Extender: Jeri Cos  Weeks in Treatment: 15 Subjective Chief Complaint Information obtained from Patient Sacral pressure ulcer History of Present Illness (HPI) 10/05/2021 patient presents today for a wound that actually began on around July 06, 2021. This appears initially to have been she tells me a blister that came up and then subsequently open. My concern here is that this was probably more of an abscess than a pressure ulcer she is actually a fairly active individual she does not lay in bed all the time she does ambulate and again I think pressure is a much less likely because of this to be perfectly honest that an abscess. Nonetheless I see no signs of infection right now she does have some slough buildup on the surface of the wound but nothing too significant which is great news. No fevers, chills, nausea, vomiting, or diarrhea. The patient does have a history otherwise of hypertension, [place]-term use of anticoagulant therapy, and she does have some dementia. 10/19/2021 upon evaluation today patient's wound showed some signs of improvement though she continues to have some issues as well with some slough buildup this can require little bit of sharp debridement. She did not have a dressing on today but she had just recently taken a shower. Obviously that is okay but we do want to try to keep this covered is much as possible to help keep it from getting too wet. She is also has been having unfortunately some issues with urinary symptoms frequent  urination being the main thing here. Again I do believe that still of utmost concern for her to get checked out if she has a UTI that needs to be addressed sooner rather than later. 11/10/2021 upon evaluation today patient appears to be doing well with regard to her wound from the standpoint of there being no infection unfortunately she does have a lot of moisture and this really is not being changed as far as the dressing is concerned. We have been using collagen but nobody is really been changing the dressings regularly as directed. Nonetheless her niece who is with her today says that she is can work on getting that done appropriately. 12/15/2021 upon evaluation today patient appears to be doing somewhat better in regard to the wound in the sacral region. Fortunately there does not appear to be any evidence of active infection locally nor systemically at this point she has a small pressure injury just above and to the left of the original wound this is very superficial but nonetheless present. 01/04/2022 upon evaluation today patient appears to be doing well with regard to her wound. This is showing signs of improvement one of the 2 wounds is completely healed at the original is still open but smaller. Overall I am in agreement that this seems to be heading in the right direction. 01/19/2022 upon evaluation today patient appears to be doing excellent in regard to her wound. Fortunately there is no signs of infection and everything appears to be completely healed today. I am very pleased in this regard. Objective Constitutional Well-nourished and well-hydrated in no acute distress. Vitals Time Taken: 3:53 PM, Height: 60 in, Weight: 116 lbs, BMI: 22.7, Temperature: 98.8 F, Pulse: 92 bpm, Respiratory Rate: 16 breaths/min, Blood Pressure: 136/75 mmHg. Respiratory normal breathing without difficulty. Psychiatric this patient is able to make decisions and demonstrates good insight into disease  process. Alert and Oriented x 3. pleasant and cooperative. General Notes: Upon inspection patient's wound bed showed signs of good epithelization at this point. Fortunately I do not  see any evidence of active infection locally nor systemically which is great news and overall I think that we are ready for discharge today. Integumentary (Hair, Skin) [name], [name]. ([id number]) Wound #1 status is Healed - Epithelialized. Original cause of wound was Pressure Injury. The date acquired was: 07/06/2021. The wound has been in treatment 15 weeks. The wound is located on the Midline Sacrum. The wound measures 0cm length x 0cm width x 0cm depth; 0cm^2 area and 0cm^3 volume. There is a none present amount of drainage noted. The wound margin is flat and intact. There is no granulation within the wound bed. There is no necrotic tissue within the wound bed. Assessment Active Problems ICD-10 Cutaneous abscess of buttock Non-pressure chronic ulcer of buttock with fat layer exposed Pressure ulcer of sacral region, stage 2 Essential (primary) hypertension [place] term (current) use of anticoagulants Vascular dementia without behavioral disturbance Plan Discharge From [facility] Services: Discharge from [place] [facility] Complete Off-Loading: Turn and reposition every 2 hours Other: - recommend to keep newly healed skin covered for a week and keep pressure off of the wound 1. Would recommend currently that we go ahead and discontinue wound care services as the patient is completely healed and this is excellent news. I am very pleased with how things have turned out here. We will see her back for follow-up visit as needed. Electronic Signature(s) Signed: 01/19/2022 4:17:14 PM By: Worthy Keeler PA-C Entered By: Worthy Keeler on 01/19/2022 16:17:14 [name], [name]. ([id number]) -------------------------------------------------------------------------------- SuperBill Details Patient Name: [name], [name]. Date  of Service: 01/19/2022 Medical Record Number: [id number] Patient Account Number: [id number] Date of Birth/Sex: 1932/04/30 (88 y.o. F) Treating RN: Donnamarie Poag Primary Care Provider: Antony Contras Other Clinician: Referring Provider: Antony Contras Treating Provider/Extender: Skipper Cliche in Treatment: 15 Diagnosis Coding ICD-10 Codes Code Description L02.31 Cutaneous abscess of buttock L98.412 Non-pressure chronic ulcer of buttock with fat layer exposed L89.152 Pressure ulcer of sacral region, stage 2 I10 Essential (primary) hypertension Z79.01 [place] term (current) use of anticoagulants Vascular dementia, unspecified severity, without behavioral disturbance, psychotic disturbance, mood disturbance, and F01.50 anxiety Facility Procedures CPT4 Code: [id number] Description: [phone] - WOUND CARE VISIT-LEV 2 EST PT Modifier: Quantity: 1 Physician Procedures CPT4 Code: [id number] Description: [id number] - WC PHYS LEVEL 4 - EST PT Modifier: Quantity: 1 CPT4 Code: Description: ICD-10 Diagnosis Description L02.31 Cutaneous abscess of buttock L98.412 Non-pressure chronic ulcer of buttock with fat layer exposed L89.152 Pressure ulcer of sacral region, stage 2 I10 Essential (primary) hypertension Modifier: Quantity: Electronic Signature(s) Signed: 01/19/2022 4:17:40 PM By: Worthy Keeler PA-C Entered By: Worthy Keeler on 01/19/2022 16:17:39

## 2022-01-19 NOTE — Progress Notes (Addendum)
[name], [name]. ([id number]) Visit Report for 01/19/2022 Arrival Information Details Patient Name: [name], [name]. Date of Service: 01/19/2022 3:30 PM Medical Record Number: [id number] Patient Account Number: [id number] Date of Birth/Sex: June 22, 1932 (88 y.o. F) Treating RN: [name] Primary Care [name]: [name] Other Clinician: Referring [name]: [name] Treating [name]/Extender: [name] in Treatment: 15 Visit Information History Since Last Visit Added or deleted any medications: No Patient Arrived: Ambulatory Had a fall or experienced change in No Arrival Time: 15:51 activities of daily living that may affect Accompanied By: niece risk of falls: Transfer Assistance: EasyPivot Patient Lift Hospitalized since last visit: No Patient Identification Verified: Yes Has Dressing in Place as Prescribed: Yes Secondary Verification Process Completed: Yes Pain Present Now: No Patient Requires Transmission-Based No Precautions: Patient Has Alerts: Yes Patient Alerts: Patient on Blood Thinner NOT DIABETIC ELIOQUIS Electronic Signature(s) Signed: 01/19/2022 4:01:45 PM By: [name] Entered By: [name] on 01/19/2022 15:51:58 [name], [name]. ([id number]) -------------------------------------------------------------------------------- Clinic Level of Care Assessment Details Patient Name: [name], [name]. Date of Service: 01/19/2022 3:30 PM Medical Record Number: [id number] Patient Account Number: [id number] Date of Birth/Sex: 1932/06/08 (88 y.o. F) Treating RN: [name] Primary Care [name]: [name] Other Clinician: Referring [name]: [name] Treating Katalia Choma/Extender: [name] in Treatment: 15 Clinic Level of Care Assessment Items TOOL 4 Quantity Score []  - Use when only an EandM is performed on FOLLOW-UP visit 0 ASSESSMENTS - Nursing Assessment / Reassessment []  - Reassessment of Co-morbidities (includes updates in patient status) 0 []   - 0 Reassessment of Adherence to Treatment Plan ASSESSMENTS - Wound and Skin Assessment / Reassessment X - Simple Wound Assessment / Reassessment - one wound 1 5 []  - 0 Complex Wound Assessment / Reassessment - multiple wounds []  - 0 Dermatologic / Skin Assessment (not related to wound area) ASSESSMENTS - Focused Assessment []  - Circumferential Edema Measurements - multi extremities 0 []  - 0 Nutritional Assessment / Counseling / Intervention []  - 0 Lower Extremity Assessment (monofilament, tuning fork, pulses) []  - 0 Peripheral Arterial Disease Assessment (using hand held doppler) ASSESSMENTS - Ostomy and/or Continence Assessment and Care []  - Incontinence Assessment and Management 0 []  - 0 Ostomy Care Assessment and Management (repouching, etc.) PROCESS - Coordination of Care X - Simple Patient / Family Education for ongoing care 1 15 []  - 0 Complex (extensive) Patient / Family Education for ongoing care []  - 0 Staff obtains Programmer, systems, Records, Test Results / Process Orders []  - 0 Staff telephones HHA, Nursing Homes / Clarify orders / etc []  - 0 Routine Transfer to another Facility (non-emergent condition) []  - 0 Routine Hospital Admission (non-emergent condition) []  - 0 New Admissions / Biomedical engineer / Ordering NPWT, Apligraf, etc. []  - 0 Emergency Hospital Admission (emergent condition) X- 1 10 Simple Discharge Coordination []  - 0 Complex (extensive) Discharge Coordination PROCESS - Special Needs []  - Pediatric / Minor Patient Management 0 []  - 0 Isolation Patient Management []  - 0 Hearing / Language / Visual special needs []  - 0 Assessment of Community assistance (transportation, D/C planning, etc.) []  - 0 Additional assistance / Altered mentation []  - 0 Support Surface(s) Assessment (bed, cushion, seat, etc.) INTERVENTIONS - Wound Cleansing / Measurement [name], [name]. ([id number]) X- 1 5 Simple Wound Cleansing - one wound []  - 0 Complex Wound  Cleansing - multiple wounds X- 1 5 Wound Imaging (photographs - any number of wounds) []  - 0 Wound Tracing (instead of photographs) X- 1 5 Simple Wound  Measurement - one wound []  - 0 Complex Wound Measurement - multiple wounds INTERVENTIONS - Wound Dressings X - Small Wound Dressing one or multiple wounds 1 10 []  - 0 Medium Wound Dressing one or multiple wounds []  - 0 Large Wound Dressing one or multiple wounds []  - 0 Application of Medications - topical []  - 0 Application of Medications - injection INTERVENTIONS - Miscellaneous []  - External ear exam 0 []  - 0 Specimen Collection (cultures, biopsies, blood, body fluids, etc.) []  - 0 Specimen(s) / Culture(s) sent or taken to Lab for analysis []  - 0 Patient Transfer (multiple staff / Civil Service fast streamer / Similar devices) []  - 0 Simple Staple / Suture removal (25 or less) []  - 0 Complex Staple / Suture removal (26 or more) []  - 0 Hypo / Hyperglycemic Management (close monitor of Blood Glucose) []  - 0 Ankle / Brachial Index (ABI) - do not check if billed separately X- 1 5 Vital Signs Has the patient been seen at the hospital within the last three years: Yes Total Score: 60 Level Of Care: New/Established - Level 2 Electronic Signature(s) Signed: 01/19/2022 4:38:22 PM By: [name] Entered By: [name] on 01/19/2022 16:11:10 [name], [name]. ([id number]) -------------------------------------------------------------------------------- Encounter Discharge Information Details Patient Name: [name], [name]. Date of Service: 01/19/2022 3:30 PM Medical Record Number: [id number] Patient Account Number: [id number] Date of Birth/Sex: 03/30/1932 (88 y.o. F) Treating RN: [name] Primary Care [name]: [name] Other Clinician: Referring [name]: [name] Treating Hadlie Gipson/Extender: [name] in Treatment: 15 Encounter Discharge Information Items Discharge Condition: Stable Ambulatory Status: Ambulatory Discharge  Destination: Home Transportation: Private Auto Accompanied By: niece Schedule Follow-up Appointment: Yes Clinical Summary of Care: Electronic Signature(s) Signed: 01/19/2022 4:38:22 PM By: [name] Entered By: [name] on 01/19/2022 16:12:31 [name], [name]. ([id number]) -------------------------------------------------------------------------------- Lower Extremity Assessment Details Patient Name: [name], [name]. Date of Service: 01/19/2022 3:30 PM Medical Record Number: [id number] Patient Account Number: [id number] Date of Birth/Sex: May 21, 1932 (88 y.o. F) Treating RN: [name] Primary Care Chelbie Jarnagin: [name] Other Clinician: Referring Zyliah Schier: [name] Treating Rishikesh Khachatryan/Extender: [name] in Treatment: 15 Electronic Signature(s) Signed: 01/19/2022 4:01:45 PM By: [name] Entered By: [name] on 01/19/2022 15:58:38 [name], [name]. ([id number]) -------------------------------------------------------------------------------- Multi Wound Chart Details Patient Name: [name], [name]. Date of Service: 01/19/2022 3:30 PM Medical Record Number: [id number] Patient Account Number: [id number] Date of Birth/Sex: 07-27-1932 (88 y.o. F) Treating RN: [name] Primary Care Shalay Carder: [name] Other Clinician: Referring Enoc Getter: [name] Treating Kinsley Nicklaus/Extender: [name] in Treatment: 15 Vital Signs Height(in): 60 Pulse(bpm): 92 Weight(lbs): 116 Blood Pressure(mmHg): 136/75 Body Mass Index(BMI): 22.7 Temperature(F): 98.8 Respiratory Rate(breaths/min): 16 Photos: [N/A:N/A] Wound Location: Midline Sacrum N/A N/A Wounding Event: Pressure Injury N/A N/A Primary Etiology: Abscess N/A N/A Comorbid History: Hypertension, Peripheral Venous N/A N/A Disease, Received Chemotherapy Date Acquired: 07/06/2021 N/A N/A Weeks of Treatment: 15 N/A N/A Wound Status: Open N/A N/A Wound Recurrence: No N/A N/A Measurements L x W x D (cm) 0.1x0.1x0.1 N/A  N/A Area (cm) : 0.008 N/A N/A Volume (cm) : 0.001 N/A N/A % Reduction in Area: 99.90% N/A N/A % Reduction in Volume: 99.90% N/A N/A Classification: Full Thickness Without Exposed N/A N/A Support Structures Exudate Amount: None Present N/A N/A Wound Margin: Flat and Intact N/A N/A Granulation Amount: None Present (0%) N/A N/A Necrotic Amount: None Present (0%) N/A N/A Exposed Structures: Fascia: No N/A N/A Fat Layer (Subcutaneous Tissue): No Tendon: No Muscle: No Joint: No Bone: No  Epithelialization: None N/A N/A Treatment Notes Electronic Signature(s) Signed: 01/19/2022 4:38:22 PM By: [name] Entered By: [name] on 01/19/2022 16:07:43 Gautney, Shoua [name]. ([id number]) -------------------------------------------------------------------------------- Multi-Disciplinary Care Plan Details Patient Name: [name], [name]. Date of Service: 01/19/2022 3:30 PM Medical Record Number: [id number] Patient Account Number: [id number] Date of Birth/Sex: Aug 08, 1932 (88 y.o. F) Treating RN: [name] Primary Care Emberleigh Reily: [name] Other Clinician: Referring Dolph Tavano: [name] Treating Karelyn Brisby/Extender: [name] in Treatment: 15 Active Inactive Electronic Signature(s) Signed: 01/19/2022 4:38:22 PM By: [name] Previous Signature: 01/19/2022 4:01:45 PM Version By: [name] Entered By: [name] on 01/19/2022 16:06:01 [name], [name]. ([id number]) -------------------------------------------------------------------------------- Pain Assessment Details Patient Name: [name], [name]. Date of Service: 01/19/2022 3:30 PM Medical Record Number: [id number] Patient Account Number: [id number] Date of Birth/Sex: 1932/03/20 (88 y.o. F) Treating RN: [name] Primary Care Carlean Crowl: [name] Other Clinician: Referring Erico Stan: [name] Treating Chelcea Zahn/Extender: [name] in Treatment: 15 Active Problems Location of Pain Severity and Description of Pain Patient  Has Paino No Site Locations Rate the pain. Current Pain Level: 0 Pain Management and Medication Current Pain Management: Electronic Signature(s) Signed: 01/19/2022 4:01:45 PM By: [name] Entered By: [name] on 01/19/2022 15:54:03 [name], [name]. ([id number]) -------------------------------------------------------------------------------- Patient/Caregiver Education Details Patient Name: Stampley, Yousra [name]. Date of Service: 01/19/2022 3:30 PM Medical Record Number: [id number] Patient Account Number: [id number] Date of Birth/Gender: 06/21/1932 (88 y.o. F) Treating RN: [name] Primary Care Physician: [name] Other Clinician: Referring Physician: Antony [name] Treating Physician/Extender: [name] in Treatment: 15 Education Assessment Education Provided To: Patient Education Topics Provided Basic Hygiene: Offloading: Electronic Signature(s) Signed: 01/19/2022 4:38:22 PM By: [name] Entered By: [name] on 01/19/2022 16:11:54 [name], [name]. ([id number]) -------------------------------------------------------------------------------- Wound Assessment Details Patient Name: [name], [name]. Date of Service: 01/19/2022 3:30 PM Medical Record Number: [id number] Patient Account Number: [id number] Date of Birth/Sex: [birth year] (88 y.o. F) Treating RN: [name] Primary Care Shawn Dannenberg: [name] Other Clinician: Referring Arnie Clingenpeel: [name] Treating Sajad Glander/Extender: [name] in Treatment: 15 Wound Status Wound Number: 1 Primary Abscess Etiology: Wound Location: Midline Sacrum Wound Status: Healed - Epithelialized Wounding Event: Pressure Injury Comorbid Hypertension, Peripheral Venous Disease, Received Date Acquired: 07/06/2021 History: Chemotherapy Weeks Of Treatment: 15 Clustered Wound: No Photos Wound Measurements Length: (cm) 0 Width: (cm) 0 Depth: (cm) 0 Area: (cm) 0 Volume: (cm) 0 % Reduction in Area: 100% % Reduction in Volume:  100% Epithelialization: None Wound Description Classification: Full Thickness Without Exposed Support Structure Wound Margin: Flat and Intact Exudate Amount: None Present s Foul Odor After Cleansing: No Slough/Fibrino No Wound Bed Granulation Amount: None Present (0%) Exposed Structure Necrotic Amount: None Present (0%) Fascia Exposed: No Fat Layer (Subcutaneous Tissue) Exposed: No Tendon Exposed: No Muscle Exposed: No Joint Exposed: No Bone Exposed: No Treatment Notes Wound #1 (Sacrum) Wound Laterality: Midline Cleanser Peri-Wound Care Topical Primary Dressing Clouse, Atlas [name]. ([id number]) Secondary Dressing Secured With Compression Wrap Compression Stockings Add-Ons Electronic Signature(s) Signed: 01/19/2022 4:38:22 PM By: [name] Previous Signature: 01/19/2022 4:01:45 PM Version By: [name] Entered By: [name] on 01/19/2022 16:10:15 Erhard, Kalya [name]. ([id number]) -------------------------------------------------------------------------------- Vitals Details Patient Name: Westergren, Harleyquinn [name]. Date of Service: 01/19/2022 3:30 PM Medical Record Number: [id number] Patient Account Number: [id number] Date of Birth/Sex: 1932-01-25 (88 y.o. F) Treating RN: [name] Primary Care Dariane Natzke: [name] Other Clinician: Referring Claris Pech: [name] Treating Terrika Zuver/Extender: [name] in Treatment: 15 Vital Signs Time Taken: 15:53 Temperature (F): 98.8  Height (in): 60 Pulse (bpm): 92 Weight (lbs): 116 Respiratory Rate (breaths/min): 16 Body Mass Index (BMI): 22.7 Blood Pressure (mmHg): 136/75 Reference Range: 80 - 120 mg / dl Electronic Signature(s) Signed: 01/19/2022 4:01:45 PM By: [name] Entered ByDonnamarie [name] on 01/19/2022 15:53:43

## 2022-02-11 ENCOUNTER — Other Ambulatory Visit: Payer: Self-pay

## 2022-02-11 ENCOUNTER — Encounter (HOSPITAL_COMMUNITY): Payer: Self-pay

## 2022-02-11 ENCOUNTER — Ambulatory Visit (HOSPITAL_COMMUNITY)
Admission: RE | Admit: 2022-02-11 | Discharge: 2022-02-11 | Disposition: A | Discharge status: Home / Self Care | Payer: Medicare HMO | Source: Ambulatory Visit | Attending: Hematology and Oncology | Admitting: Hematology and Oncology

## 2022-02-11 DIAGNOSIS — I2510 Atherosclerotic heart disease of native coronary artery without angina pectoris: Secondary | ICD-10-CM | POA: Diagnosis not present

## 2022-02-11 DIAGNOSIS — C8330 Diffuse large B-cell lymphoma, unspecified site: Secondary | ICD-10-CM | POA: Diagnosis not present

## 2022-02-11 DIAGNOSIS — M160 Bilateral primary osteoarthritis of hip: Secondary | ICD-10-CM | POA: Diagnosis not present

## 2022-02-11 DIAGNOSIS — K769 Liver disease, unspecified: Secondary | ICD-10-CM | POA: Diagnosis not present

## 2022-02-11 LAB — GLUCOSE, CAPILLARY: Glucose-Capillary: 109 mg/dL — ABNORMAL HIGH (ref 70–99)

## 2022-02-11 MED ORDER — FLUDEOXYGLUCOSE F - 18 (FDG) INJECTION
5.2700 | Freq: Once | INTRAVENOUS | Status: AC | PRN
  Administered 2022-02-11: 5.27 via INTRAVENOUS

## 2022-02-12 ENCOUNTER — Encounter: Payer: Self-pay | Admitting: Internal Medicine

## 2022-02-12 ENCOUNTER — Ambulatory Visit: Payer: Medicare HMO | Admitting: Internal Medicine

## 2022-02-12 VITALS — BP 118/72 | HR 94 | Ht 59.0 in | Wt 106.0 lb

## 2022-02-12 DIAGNOSIS — E0590 Thyrotoxicosis, unspecified without thyrotoxic crisis or storm: Secondary | ICD-10-CM | POA: Diagnosis not present

## 2022-02-12 DIAGNOSIS — E042 Nontoxic multinodular goiter: Secondary | ICD-10-CM | POA: Diagnosis not present

## 2022-02-12 NOTE — Progress Notes (Signed)
? ? ?Name: [name]  ?MRN/ DOB: [id number], [birth year]    ?Age/ Sex: 88 y.o., female   ? ?PCP: Antony Contras, MD   ?Reason for Endocrinology Evaluation: Multinodular goiter  ?   ?Date of Initial Endocrinology Evaluation: 02/12/2022   ? ? ?HPI: ?[name] is a 88 y.o. female with a past medical history of HTN, MNG, lymphoma. The patient presented for initial endocrinology clinic visit on 02/12/2022 for consultative assistance with her multinodular goiter.  ? ?She has been diagnosed with multinodular goiter on 11/2021 following thyroid ultrasound.  Of right inferior and superior isthmic nodule met criteria for FNA which was done on 12/24/2021.  Right thyroid nodule cytology was benign and the superior isthmic nodule cytology was consistent with atypia of undetermined significance (Bethesda category III) with benign Afirma. ? ?Patient also has been noted with a low TSH at 0.245 uIU/mL with normal free T4 on 06/30/2021. ? ? ? ?Pt has been noted with gradual weight loss since 10/2021 ? ?She is accompanied by her grand daughter today  ?Has occasional diarrhea or loose stools  ?Denies hand tremors  ?Has noted local neck swelling  ? ? ?No family history of thyroid disease ? ? ?HISTORY:  ?Past Medical History:  ?Past Medical History:  ?Diagnosis Date  ? Anemia   ? Arthritis   ? knees   ? Cancer [facility])   ? GERD (gastroesophageal reflux disease)   ? Hiatal hernia   ? History of inguinal hernia repair   ? BIH  ? Hyperlipidemia   ? Hypertension   ? Memory problem   ? ?Past Surgical History:  ?Past Surgical History:  ?Procedure Laterality Date  ? COLONOSCOPY    ? HERNIA REPAIR  10/13/11  ? lap BIH rep w/ mesh- Dr. Dalbert Batman   ? [place]  10/13/2011  ? Procedure: LAPAROSCOPIC INGUINAL HERNIA;  Surgeon: Adin Hector, MD;  Location: [facility];  Service: General;  Laterality: Bilateral;  Laparoscopic bilateral inguinal hernia repair, converted to open bilateral inguinal hernia repairs with mesh  ? IR IMAGING GUIDED PORT  INSERTION  05/06/2021  ? NASAL ENDOSCOPY Left 04/08/2021  ? Procedure: NASAL ENDOSCOPY WITH BIOPSY OF LEFT NASOPPHARYNGEAL MASS;  Surgeon: Jason Coop, DO;  Location: [place];  Service: ENT;  Laterality: Left;  [place] Section  ?  ?Social History:  reports that she has never smoked. She has never used smokeless tobacco. She reports that she does not drink alcohol and does not use drugs. ?Family History: family history includes Heart disease in her mother. ? ? ?HOME MEDICATIONS: ?Allergies as of 02/12/2022   ?No Known Allergies ?  ? ?  ?Medication List  ?  ? ?  ? Accurate as of February 12, 2022 11:10 AM. If you have any questions, ask your nurse or doctor.  ?  ?  ? ?  ? ?STOP taking these medications   ? ?triamterene-hydrochlorothiazide 37.5-25 MG tablet ?Commonly known as: MAXZIDE-25 ?Stopped by: Dorita Sciara, MD ?  ? ?  ? ?TAKE these medications   ? ?acetaminophen 500 MG tablet ?Commonly known as: TYLENOL ?Take 1,000 mg by mouth every 6 (six) hours as needed for mild pain or fever. ?  ?allopurinol 300 MG tablet ?Commonly known as: ZYLOPRIM ?Take 1 tablet (300 mg total) by mouth daily. ?  ?amLODipine 10 MG tablet ?Commonly known as: NORVASC ?Take 10 mg by mouth daily. ?  ?apixaban 5 MG Tabs tablet ?Commonly known as: ELIQUIS ?Take 1 tablet (  5 mg total) by mouth 2 (two) times daily. ?  ?atorvastatin 80 MG tablet ?Commonly known as: LIPITOR ?Take 80 mg by mouth every morning. ?  ?ezetimibe 10 MG tablet ?Commonly known as: ZETIA ?Take 10 mg by mouth every morning. ?  ?lidocaine 2 % solution ?Commonly known as: XYLOCAINE ?Patient: Mix 1part 2% viscous lidocaine, 1part H20. Swish & swallow 30m of diluted mixture, 335m before meals and at bedtime, up to QID ?  ?LORazepam 0.5 MG tablet ?Commonly known as: ATIVAN ?Take 1 tablet by mouth at bedtime as needed. ?  ?memantine 10 MG tablet ?Commonly known as: NAMENDA ?Take 1/2 tablet (5 mg) for 1 week at bedtime, then 1 tablet (10 mg) at bedtime. ?What changed: Another  medication with the same name was removed. Continue taking this medication, and follow the directions you see here. ?Changed by: IbDorita SciaraMD ?  ? ?  ?  ? ? ?REVIEW OF SYSTEMS: ?A comprehensive ROS was conducted with the patient and is negative except as per HPI  ? ? ?OBJECTIVE:  ?VS: BP 118/72 (BP Location: Left Arm, Patient Position: Sitting, Cuff Size: Small)   Pulse 94   Ht _0  (1.499 m)   Wt 106 lb (48.1 kg)   SpO2 98%   BMI 21.41 kg/m?   ? ?Wt Readings from Last 3 Encounters:  ?02/12/22 106 lb (48.1 kg)  ?01/14/22 106 lb 4.8 oz (48.2 kg)  ?12/11/21 109 lb 2 oz (49.5 kg)  ? ? ? ?EXAM: ?General: Pt appears well and is in NAD, patient in a wheelchair  ?Neck: General: Supple without adenopathy. ?Thyroid: Thyroid size normal.  Isthmic nodule appreciated.   ?Lungs: Clear with good BS bilat with no rales, rhonchi, or wheezes  ?Heart: Auscultation: RRR.  ?Abdomen: soft, nontender  ?Extremities:  ?BL LE: Trace pretibial edema   ?Mental Status: Judgment, insight: Intact ?Orientation: Oriented to time, place, and person ?Mood and affect: No depression, anxiety, or agitation  ? ? ? ?DATA REVIEWED: ? ? Latest Reference Range & Units 02/12/22 11:08  ?TSH 0.35 - 5.50 uIU/mL 1.23  ?Triiodothyronine (T3) 76 - 181 ng/dL 117  ?T4,Free(Direct) 0.60 - 1.60 ng/dL 0.90  ? ? Latest Reference Range & Units 02/12/22 11:08  ?TRAB <=2.00 IU/L 1.62  ? ? ? ? ? Latest Reference Range & Units 01/14/22 08:59  ?Sodium 135 - 145 mmol/L 143  ?Potassium 3.5 - 5.1 mmol/L 3.9  ?Chloride 98 - 111 mmol/L 107  ?CO2 22 - 32 mmol/L 29  ?Glucose 70 - 99 mg/dL 90  ?BUN 8 - 23 mg/dL 20  ?Creatinine 0.44 - 1.00 mg/dL 0.58  ?Calcium 8.9 - 10.3 mg/dL 9.0  ?Anion gap 5 - 15  7  ?Alkaline Phosphatase 38 - 126 U/L 104  ?Albumin 3.5 - 5.0 g/dL 3.6  ?AST 15 - 41 U/L 22  ?ALT 0 - 44 U/L 23  ?Total Protein 6.5 - 8.1 g/dL 6.3 (L)  ? ? Latest Reference Range & Units 01/14/22 08:26  ?WBC 4.0 - 10.5 K/uL 4.6  ?RBC 3.87 - 5.11 MIL/uL 4.37  ?Hemoglobin  12.0 - 15.0 g/dL 9.9 (L)  ?HCT 36.0 - 46.0 % 30.6 (L)  ?MCV 80.0 - 100.0 fL 70.0 (L)  ?MCH 26.0 - 34.0 pg 22.7 (L)  ?MCHC 30.0 - 36.0 g/dL 32.4  ?RDW 11.5 - 15.5 % 16.9 (H)  ?Platelets 150 - 400 K/uL 224  ?nRBC 0.0 - 0.2 % 0.0  ?Neutrophils % 48  ?Lymphocytes % 39  ?Monocytes  Relative % 8  ?Eosinophil % 5  ?Basophil % 0  ?Immature Granulocytes % 0  ?NEUT# 1.7 - 7.7 K/uL 2.2  ?Lymphocyte # 0.7 - 4.0 K/uL 1.8  ?Monocyte # 0.1 - 1.0 K/uL 0.4  ?Eosinophils Absolute 0.0 - 0.5 K/uL 0.2  ?Basophils Absolute 0.0 - 0.1 K/uL 0.0  ?Abs Immature Granulocytes 0.00 - 0.07 K/uL 0.01  ? ? ? ? ?Thyroid ULtrasound 12/11/2021 ? ? Estimated total number of nodules >/= 1 cm: 2 ?  ?Number of spongiform nodules >/=  2 cm not described below (TR1): 0 ?  ?Number of mixed cystic and solid nodules >/= 1.5 cm not described ?below ([place]): 0 ?  ?_________________________________________________________ ?  ?Nodule # 1: ?  ?Location: Right; Inferior ?  ?Maximum size: 3.4 cm; Other 2 dimensions: 1.2 x 2.8 cm ?  ?Composition: solid/almost completely solid (2) ?  ?Echogenicity: isoechoic (1) ?  ?Shape: not taller-than-wide (0) ?  ?Margins: ill-defined (0) ?  ?Echogenic foci: none (0) ?  ?ACR TI-RADS total points: 3. ?  ?ACR TI-RADS risk category: TR3 (3 points). ?  ?ACR TI-RADS recommendations: ?  ?**Given size (>/= 2.5 cm) and appearance, fine needle aspiration of ?this mildly suspicious nodule should be considered based on TI-RADS ?criteria. ?  ?_________________________________________________________ ?  ?Nodule # 2: ?  ?Location: [place]; [place] ?  ?Maximum size: 1.7 cm; Other 2 dimensions: 1.5 x 1.3 cm ?  ?Composition: solid/almost completely solid (2) ?  ?Echogenicity: hypoechoic (2) ?  ?Shape: not taller-than-wide (0) ?  ?Margins: ill-defined (0) ?  ?Echogenic foci: macrocalcifications (1) ?  ?ACR TI-RADS total points: 5. ?  ?ACR TI-RADS risk category: TR4 (4-6 points). ?  ?ACR TI-RADS recommendations: ?  ?**Given size (>/= 1.5 cm) and  appearance, fine needle aspiration of ?this moderately suspicious nodule should be considered based on ?TI-RADS criteria. ?  ?_________________________________________________________ ?  ?IMPRESSION: ?1. Approxi

## 2022-02-12 NOTE — Patient Instructions (Signed)
You have thyroid nodules/cysts/growths- these are benign but will need to continue to monitor them through serial ultrasounds  ? ? ? ? ? ?We recommend that you follow these hyperthyroidism instructions at home: ? ?1) Take Methimazole ( dose will be determined after your thyroid test)  ?If you develop severe sore throat with high fevers OR develop unexplained yellowing of your skin, eyes, under your tongue, severe abdominal pain with nausea or vomiting --> then please get evaluated immediately. ? ? ? ?

## 2022-02-14 LAB — TRAB (TSH RECEPTOR BINDING ANTIBODY): TRAB: 1.62 IU/L (ref <=2.00)

## 2022-02-14 LAB — T3: T3, Total: 117 ng/dL (ref 76–181)

## 2022-02-16 ENCOUNTER — Encounter: Payer: Self-pay | Admitting: Internal Medicine

## 2022-02-16 LAB — TSH: TSH: 1.23 u[IU]/mL (ref 0.35–5.50)

## 2022-02-16 LAB — T4, FREE: Free T4: 0.9 ng/dL (ref 0.60–1.60)

## 2022-02-19 ENCOUNTER — Inpatient Hospital Stay: Payer: Medicare HMO | Attending: Hematology and Oncology | Admitting: Hematology and Oncology

## 2022-02-19 ENCOUNTER — Encounter: Payer: Self-pay | Admitting: Hematology and Oncology

## 2022-02-19 ENCOUNTER — Other Ambulatory Visit: Payer: Self-pay

## 2022-02-19 VITALS — BP 123/65 | HR 89 | Temp 97.9°F | Resp 18 | Ht 59.0 in | Wt 107.5 lb

## 2022-02-19 DIAGNOSIS — I10 Essential (primary) hypertension: Secondary | ICD-10-CM | POA: Insufficient documentation

## 2022-02-19 DIAGNOSIS — C8330 Diffuse large B-cell lymphoma, unspecified site: Secondary | ICD-10-CM

## 2022-02-19 DIAGNOSIS — E041 Nontoxic single thyroid nodule: Secondary | ICD-10-CM

## 2022-02-19 DIAGNOSIS — C8331 Diffuse large B-cell lymphoma, lymph nodes of head, face, and neck: Secondary | ICD-10-CM | POA: Diagnosis not present

## 2022-02-19 NOTE — Progress Notes (Signed)
[name] ?CONSULT NOTE ? ?Patient Care Team: ?Antony Contras, MD as PCP - General (Family Medicine) ?Benay Pike, MD as Consulting Physician (Hematology and Oncology) ?Eppie Gibson, MD as Consulting Physician (Radiation Oncology) ?Malmfelt, Stephani Police, RN as Oncology Nurse Navigator ? ?CHIEF COMPLAINTS/PURPOSE OF CONSULTATION:  ?DLBCL of nasopharynx. ? ?ASSESSMENT & PLAN:  ? ?GCB subtype, DLBCL of nasopharynx, Stage IV ? ?Diffuse large B-cell lymphoma ([place]) ?This is a very pleasant 88 year old female patient with diffuse large B-cell lymphoma of the nasopharynx with a metastatic disease noticed on imaging who is here for a follow-up after completing 6 cycles of mini RCHOP.  ? ?She had excellent response in all metastatic sites however had residual disease at the nasopharynx on most recent PET/CT. Given her age, mentation and quality of life, we proceeded with adjuvant radiation although we have clearly discussed that this is not ideal treatment. ?She completed palliative radiation from 12/21/2021 through 01/01/2022. ?She had another PET for surveillance. ?PET showed interval development of hypermetabolic skeletal lesions in the left humerus, left scapula, right femoral neck, Deauville 4. ?She is not quite symptomatic however it is hard to rely on her history because she is not a good historian.  ?She listens to the conversation but it is not clear if she actually understands gravity of the situation.  She asks me interesting questions in the middle of a very serious conversation like what should she tell her friend who asked her some questions about her cancer which makes me believe that she does not really understand the seriousness of the situation. ?At this time, she can certainly try more chemotherapy but refractory lymphomas in general tend to have very poor prognosis.  And she is not a candidate for any intensive chemotherapy regimen.  At the best I can offer her rituximab with Gemzar  which may not cause significant change in the quantity of her life.  Son and the rest of the family are hoping to proceed with comfort care at this point.  I will talk to Dr. Isidore Moos if there is any role for radiating the femur since this is a major weightbearing bone. ?Son will talk to the rest of the family and give me a call next week about the decision. ? ?No orders of the defined types were placed in this encounter. ? ? ?HISTORY OF PRESENTING ILLNESS:  ? ?[name] 88 y.o. female is here because of new diagnosis of DLBCL of nasopharynx. ? ?[name] is a very pleasant 88 year old female patient who arrived today with her granddaughter [name] to the appointment.  She does not quite remember things as explained and has short-term memory loss.  After a bit of the conversation, she did admit to me that she was told about a cancer in her nose which was biopsied last week but she cannot remember the details.  Ms. [name] mentioned that for the past year or so grandma has been having more nasal drainage and has progressively lost weight in the past couple months of at least 20 pounds, has become relatively more sedentary and inactive, has no appetite.  They initially thought she might be having a stroke when she was not getting out of bed for 3days and has not been eating.  When she was in the ER.  She had some imaging CT head without contrast which showed left maxillary and ethmoid sinus disease. ?She then had MRI brain on the same day which showed 3.2 x 2 x 3.5 cm heterogeneous  mass positioned at the left nasopharynx near the fossa of Rosenmuller.  Finding is most concerning for a primary nasopharyngeal neoplasm.  ENT consultation was recommended. ? ?She then went on to see [name].She had nasal endoscopy with biopsy of left nasopharyngeal mass.  According to [name], polypoid mass lesion was noted to be lymphoma from the posterior aspect of the left middle turbinate. ? ?Pathology showed findings  consistent with diffuse large B-cell lymphoma of the left nasopharyngeal mass as well as left middle turbinate.  ? ?PET/CT showed hypermetabolic left nasopharyngeal mass, no associated neck adenopathy, 2 large hypermetabolic liver lesions, destructive lytic metastatic bone lesions.  Since there was no lymphadenopathy and no unusual appearance of spleen, second biopsy was recommended to confirm the diagnosis. ? ?Left ilium  Biopsy showed findings consistent with involvement by non-Hodgkin B-cell lymphoma.  Features are similar to previously known large B-cell lymphoma consistent with involvement by the same disease process. ? ?Cycle day 1 of R mini CHOP on 06/03/2021. ?C2D1 06/23/2021 ?Cycle 3-day 1 completed on July 15, 2021 ?She had an interim PET/CT which showed remarkable response. ?C4D1 on 08/05/2021 ?C5D1 on 08/26/2021 ?C6D1 09/18/2021 ?PET/CT 10/23/2021 showed persistent hypermetabolic thickening in the posterior left nasopharynx, otherwise interval decrease in size of hepatic lesions without metabolic activity, no metabolic activity in the lytic skeletal lesions.  Bur segment of mild metabolic activity in the esophagus suggest benign esophagitis. ? ?She completed radiation to the nasopharynx on 01/01/2022. ? ?PET showed interval development of hypermetabolic skeletal lesions in the left humerus, left scapula, right femoral neck, Deauville 4. No other sites concerning for disease relapse. ? ?Interval History ? ?Patient is here for follow up with her son.  She is in a wheelchair.  She tells me that she feels a bit weaker, last night she had a fall because she was trying to hold her great granddaughter.  Son states that her balance is a bit off.  She otherwise denies any B symptoms.  She has some underlying urinary incontinence.  She went to see the endocrinologist and no evidence of persistent hypothyroidism repeat labs. ?Rest of the pertinent 10 point ROS reviewed and negative. ? ?MEDICAL HISTORY:  ?Past Medical  History:  ?Diagnosis Date  ? Anemia   ? Arthritis   ? knees   ? Cancer [facility])   ? GERD (gastroesophageal reflux disease)   ? Hiatal hernia   ? History of inguinal hernia repair   ? BIH  ? Hyperlipidemia   ? Hypertension   ? Memory problem   ? ? ?SURGICAL HISTORY: ?Past Surgical History:  ?Procedure Laterality Date  ? COLONOSCOPY    ? HERNIA REPAIR  10/13/11  ? lap BIH rep w/ mesh- Dr. Dalbert Batman   ? [place]  10/13/2011  ? Procedure: LAPAROSCOPIC INGUINAL HERNIA;  Surgeon: Adin Hector, MD;  Location: [facility];  Service: General;  Laterality: Bilateral;  Laparoscopic bilateral inguinal hernia repair, converted to open bilateral inguinal hernia repairs with mesh  ? IR IMAGING GUIDED PORT INSERTION  05/06/2021  ? NASAL ENDOSCOPY Left 04/08/2021  ? Procedure: NASAL ENDOSCOPY WITH BIOPSY OF LEFT NASOPPHARYNGEAL MASS;  Surgeon: Jason Coop, DO;  Location: [facility];  Service: ENT;  Laterality: Left;  Frozen Section  ? ? ?SOCIAL HISTORY: ?Social History  ? ?Socioeconomic History  ? Marital status: Single  ?  Spouse name: Not on file  ? Number of children: Not on file  ? Years of education: Not on file  ? Highest  education level: Not on file  ?Occupational History  ? Not on file  ?Tobacco Use  ? Smoking status: Never  ? Smokeless tobacco: Never  ?Vaping Use  ? Vaping Use: Never used  ?Substance and Sexual Activity  ? Alcohol use: No  ? Drug use: No  ? Sexual activity: Not Currently  ?Other Topics Concern  ? Not on file  ?Social History Narrative  ? Not on file  ? ?Social Determinants of Health  ? ?Financial Resource Strain: Not on file  ?Food Insecurity: Not on file  ?Transportation Needs: Not on file  ?Physical Activity: Not on file  ?Stress: Not on file  ?Social Connections: Not on file  ?Intimate Partner Violence: Not on file  ? ? ?FAMILY HISTORY: ?Family History  ?Problem Relation Age of Onset  ? Heart disease Mother   ? ? ?ALLERGIES:  has No Known Allergies. ? ?MEDICATIONS:  ?Current Outpatient  Medications  ?Medication Sig Dispense Refill  ? acetaminophen (TYLENOL) 500 MG tablet Take 1,000 mg by mouth every 6 (six) hours as needed for mild pain or fever.    ? allopurinol (ZYLOPRIM) 300 MG tablet Take 1 tablet (

## 2022-03-02 NOTE — Progress Notes (Incomplete)
?Radiation Oncology         [phone] ?________________________________ ? ?Outpatient Re-Consultation ? ?Name: [name] MRN: [id number]  ?Date: 03/03/2022  DOB: 1932/02/07 ? ?MW:[id number], Shanon Brow, MD  Benay Pike, MD  ? ?REFERRING PHYSICIAN: Benay Pike, MD ? ?DIAGNOSIS: No diagnosis found. ? ? Cancer Staging  ?Diffuse large B-cell lymphoma of lymph nodes of neck (HCC) ?Staging form: Hodgkin and Non-Hodgkin Lymphoma, AJCC 8th Edition ?- Clinical stage from 12/11/2021: Stage IV (Diffuse large B-cell lymphoma) - Signed by Eppie Gibson, MD on 12/11/2021 ?Stage prefix: Initial diagnosis ? ?Diffuse large B-cell lymphoma of nasopharynx; metastatic ? ?Now with new osseous metastases to the left humerus, left scapula, and right femoral neck ? ?Interval since last radiation: 2 months and 2 days  ? ?Intent: Curative ? ?Radiation Treatment Dates: 12/21/2021 through 01/01/2022 ?Site Technique Total Dose (Gy) Dose per Fx (Gy) Completed Fx Beam Energies  ?Nasopharynx: HN_NasoP 3D 25/25 2.5 10/10 6X  ? ?CHIEF COMPLAINT: Here to discuss management of bony metastases from nasopharyngeal cancer primary ? ?HISTORY OF PRESENT ILLNESS::[name] is a 88 y.o. female who presents today for re-evaluation and consideration of further radiation in management of new bony metastases (femur). I last met with the patient for her final radiation treatment this past February directed to residual disease in the nasopharynx.  ? ?Since then, the patient underwent a restaging PET on 02/11/22 which revealed new osseous metastatic disease, including: a new hypermetabolic medullary lesion in the proximal left humerus with an SUV max of 4.4; a new focus of hypermetabolism in the left glenoid corresponding to a 1.4 cm lucent lesion with an SUV max of 5.2; and a new hypermetabolism in the right femoral neck with an SUV max of 4.4. PET otherwise showed interval resolution of the hypermetabolism seen previously in the posterior left nasopharynx, and  stable appearing hypoattenuating liver lesions without hypermetabolism. Mild metabolic activity previously seen in the distal esophagus was also noted to have resolved in the interval. ? ?Pet her most recent follow-up visit with Dr. Chryl Heck on 02/19/22, the patient was noted as mostly asymptomatic, however the patient is not a good historian. Further treatment options were discussed with the patient, though given that refractory lymphomas in general tend to have a very poor prognosis, she is not a candidate for any intensive chemotherapy regimen.  At best, Dr. Chryl Heck states that she could try her on rituximab with Gemzar, but these may not cause significant change in the quantity of her life. Dr. Chryl Heck also spoke with the patient's family who are hoping to proceed with comfort care for her at his point. In terms of RT, Dr. Chryl Heck referred the patient to me here today for consideration of radiation to the femur given that it is a major weightbearing bone.  ? ? ?PAST MEDICAL HISTORY:  has a past medical history of Anemia, Arthritis, Cancer ([place]), GERD (gastroesophageal reflux disease), Hiatal hernia, History of inguinal hernia repair, Hyperlipidemia, Hypertension, and Memory problem.   ? ?PAST SURGICAL HISTORY: ?Past Surgical History:  ?Procedure Laterality Date  ? COLONOSCOPY    ? HERNIA REPAIR  10/13/11  ? lap BIH rep w/ mesh- Dr. Dalbert Batman   ? [place]  10/13/2011  ? Procedure: LAPAROSCOPIC INGUINAL HERNIA;  Surgeon: Adin Hector, MD;  Location: [facility];  Service: General;  Laterality: Bilateral;  Laparoscopic bilateral inguinal hernia repair, converted to open bilateral inguinal hernia repairs with mesh  ? IR IMAGING GUIDED PORT INSERTION  05/06/2021  ? NASAL  ENDOSCOPY Left 04/08/2021  ? Procedure: NASAL ENDOSCOPY WITH BIOPSY OF LEFT NASOPPHARYNGEAL MASS;  Surgeon: Jason Coop, DO;  Location: [facility];  Service: ENT;  Laterality: Left;  Frozen Section  ? ? ?FAMILY HISTORY: family history includes  Heart disease in her mother. ? ?SOCIAL HISTORY:  reports that she has never smoked. She has never used smokeless tobacco. She reports that she does not drink alcohol and does not use drugs. ? ?ALLERGIES: Patient has no known allergies. ? ?MEDICATIONS:  ?Current Outpatient Medications  ?Medication Sig Dispense Refill  ? acetaminophen (TYLENOL) 500 MG tablet Take 1,000 mg by mouth every 6 (six) hours as needed for mild pain or fever.    ? allopurinol (ZYLOPRIM) 300 MG tablet Take 1 tablet (300 mg total) by mouth daily. 30 tablet 0  ? amLODipine (NORVASC) 10 MG tablet Take 10 mg by mouth daily.    ? apixaban (ELIQUIS) 5 MG TABS tablet Take 1 tablet (5 mg total) by mouth 2 (two) times daily. 60 tablet 2  ? atorvastatin (LIPITOR) 80 MG tablet Take 80 mg by mouth every morning.    ? ezetimibe (ZETIA) 10 MG tablet Take 10 mg by mouth every morning.    ? lidocaine (XYLOCAINE) 2 % solution Patient: Mix 1part 2% viscous lidocaine, 1part H20. Swish & swallow 42m of diluted mixture, 37m before meals and at bedtime, up to QID 100 mL 4  ? LORazepam (ATIVAN) 0.5 MG tablet Take 1 tablet by mouth at bedtime as needed.    ? memantine (NAMENDA) 10 MG tablet Take 1/2 tablet (5 mg) for 1 week at bedtime, then 1 tablet (10 mg) at bedtime. 27 tablet 0  ? ?No current facility-administered medications for this encounter.  ? ? ?REVIEW OF SYSTEMS:  Notable for that above. ?  ?PHYSICAL EXAM:  vitals were not taken for this visit.   ?General: Alert and oriented, in no acute distress *** ?HEENT: Head is normocephalic. Extraocular movements are intact. Oropharynx is clear. ?Neck: Neck is supple, no palpable cervical or supraclavicular lymphadenopathy. ?Heart: Regular in rate and rhythm with no murmurs, rubs, or gallops. ?Chest: Clear to auscultation bilaterally, with no rhonchi, wheezes, or rales. ?Abdomen: Soft, nontender, nondistended, with no rigidity or guarding. ?Extremities: No cyanosis or edema. ?Lymphatics: see Neck Exam ?Skin: No  concerning lesions. ?Musculoskeletal: symmetric strength and muscle tone throughout. ?Neurologic: Cranial nerves II through XII are grossly intact. No obvious focalities. Speech is fluent. Coordination is intact. ?Psychiatric: Judgment and insight are intact. Affect is appropriate. ? ? ?ECOG = *** ? ?0 - Asymptomatic (Fully active, able to carry on all predisease activities without restriction) ? ?1 - Symptomatic but completely ambulatory (Restricted in physically strenuous activity but ambulatory and able to carry out work of a light or sedentary nature. For example, light housework, office work) ? ?2 - Symptomatic, <50% in bed during the day (Ambulatory and capable of all self care but unable to carry out any work activities. Up and about more than 50% of waking hours) ? ?3 - Symptomatic, >50% in bed, but not bedbound (Capable of only limited self-care, confined to bed or chair 50% or more of waking hours) ? ?4 - Bedbound (Completely disabled. Cannot carry on any self-care. Totally confined to bed or chair) ? ?5 - Death ? ? Oken MM, Creech RH, Tormey DC, et al. ([phone] "Toxicity and response criteria of the [facility]". AmDunkertonncol. 5 (6): 649-55 ? ? ?LABORATORY DATA:  ?Lab Results  ?  Component Value Date  ? WBC 4.6 01/14/2022  ? HGB 9.9 (L) 01/14/2022  ? HCT 30.6 (L) 01/14/2022  ? MCV 70.0 (L) 01/14/2022  ? PLT 224 01/14/2022  ? ?CMP  ?   ?Component Value Date/Time  ? NA 143 01/14/2022 0859  ? K 3.9 01/14/2022 0859  ? CL 107 01/14/2022 0859  ? CO2 29 01/14/2022 0859  ? GLUCOSE 90 01/14/2022 0859  ? BUN 20 01/14/2022 0859  ? CREATININE 0.58 01/14/2022 0859  ? CREATININE 0.70 11/26/2021 1502  ? CALCIUM 9.0 01/14/2022 0859  ? PROT 6.3 (L) 01/14/2022 0859  ? ALBUMIN 3.6 01/14/2022 0859  ? AST 22 01/14/2022 0859  ? AST 16 11/26/2021 1502  ? ALT 23 01/14/2022 0859  ? ALT 15 11/26/2021 1502  ? ALKPHOS 104 01/14/2022 0859  ? BILITOT 0.4 01/14/2022 0859  ? BILITOT 0.4 11/26/2021 1502  ?  GFRNONAA >60 01/14/2022 0859  ? GFRNONAA >60 11/26/2021 1502  ? GFRAA >90 10/07/2011 1015  ? ?   ? ?  ?RADIOGRAPHY: NM PET Image Restag (PS) Skull Base To Thigh ? ?Result Date: 02/14/2022 ?CLINICAL DATA:  Everette Rank

## 2022-03-02 NOTE — Progress Notes (Incomplete)
Histology and Location of Primary Cancer:  ?Diffuse large B-cell lymphoma of nasopharynx ? ?Sites of Visceral and Bony Metastatic Disease:  ?PET Scan ?02/11/2022 ?--IMPRESSION: ?Interval development of hypermetabolic skeletal lesions in the left humerus, left scapula, and right femoral neck. Deauville 4. ?Interval resolution of the hypermetabolism seen previously in the posterior left nasopharynx. Deauville 1. ?Stable hypoattenuating liver lesions without hypermetabolism on today's imaging. Deauville 1. ?Mild metabolic activity seen in the distal esophagus previously has resolved in the interval. ? ?Location(s) of Symptomatic Metastases: *** ? ?Past/Anticipated chemotherapy by medical oncology, if any:  ?Under care of Dr. Arletha Pili Iruku ?02/19/2022 ?Completed 6 cycles of mini RCHOP.  ?She had excellent response in all metastatic sites however had residual disease at the nasopharynx on most recent PET/CT.  ?Given her age, mentation and quality of life, we proceeded with adjuvant radiation although we have clearly discussed that this is not ideal treatment. ?She completed palliative radiation from 12/21/2021 through 01/01/2022. ?She had another PET for surveillance. ?PET showed interval development of hypermetabolic skeletal lesions in the left humerus, left scapula, right femoral neck, Deauville 4. ?She is not quite symptomatic however it is hard to rely on her history because she is not a good historian.  ?She listens to the conversation but it is not clear if she actually understands gravity of the situation.   ?She asks me interesting questions in the middle of a very serious conversation like what should she tell her friend who asked her some questions about her cancer which makes me believe that she does not really understand the seriousness of the situation. ?At this time, she can certainly try more chemotherapy but refractory lymphomas in general tend to have very poor prognosis.   ?And she is not a candidate  for any intensive chemotherapy regimen.  At the best I can offer her rituximab with  ?Gemzar which may not cause significant change in the quantity of her life.   ?Son and the rest of the family are hoping to proceed with comfort care at this point.   ?I will talk to Dr. Isidore Moos if there is any role for radiating the femur since this is a major weightbearing bone. ?Son will talk to the rest of the family and give me a call next week about the decision. ? ?Pain on a scale of 0-10 is: ***  ? ? ?If Spine Met(s), symptoms, if any, include: ?Bowel/Bladder retention or incontinence (please describe): *** ?Numbness or weakness in extremities (please describe): *** ?Current Decadron regimen, if applicable: ***N/A ? ?Ambulatory status? Walker? Wheelchair?: *** ? ?SAFETY ISSUES: ?Prior radiation? Yes: 12/21/2021 through 01/01/2022 ?Site Technique Total Dose (Gy) Dose per Fx (Gy) Completed Fx Beam Energies  ?Nasopharynx: HN_NasoP 3D 25/25 2.5 10/10 6X  ? ?Pacemaker/ICD? No ?Possible current pregnancy? No ?Is the patient on methotrexate? No ? ?Current Complaints / other details:  *** ? ? ? ?

## 2022-03-03 ENCOUNTER — Ambulatory Visit: Payer: Medicare HMO | Admitting: Radiation Oncology

## 2022-03-03 ENCOUNTER — Telehealth: Payer: Self-pay | Admitting: Radiation Oncology

## 2022-03-03 ENCOUNTER — Ambulatory Visit: Payer: Medicare HMO

## 2022-03-03 ENCOUNTER — Ambulatory Visit
Admission: RE | Admit: 2022-03-03 | Discharge: 2022-03-03 | Disposition: A | Discharge status: Home / Self Care | Payer: Medicare HMO | Source: Ambulatory Visit | Attending: Radiation Oncology | Admitting: Radiation Oncology

## 2022-03-03 NOTE — Telephone Encounter (Signed)
4/12 @ 8:18 am patient's son Jenny Reichmann Novack left voicemail that patient unable to make appointment today due to illness. Nurse and CT sim has be notified.   ?

## 2022-03-03 NOTE — Telephone Encounter (Signed)
4/12 @ 8:52 am Left voicemail for patient's son to call our office to get patient reschedule for today appointment.  ?

## 2022-03-04 ENCOUNTER — Telehealth: Payer: Self-pay | Admitting: Radiation Oncology

## 2022-03-04 NOTE — Telephone Encounter (Signed)
4/13 @ 8:42 am Left voicemail with patient's son to get patient reschedule for consult.  Also call [phone] no answer just rings.   ?

## 2022-03-08 ENCOUNTER — Telehealth: Payer: Self-pay | Admitting: Radiation Oncology

## 2022-03-08 NOTE — Telephone Encounter (Signed)
4/17 @ 10:40 am Left voicemail with patient's son for patient to be r/s for her consult.   ?

## 2022-03-08 NOTE — Telephone Encounter (Signed)
4/17 @ 3:10 pm Left message with pt's family member for pt to call our office to be r/s for consult.   ?

## 2022-03-09 ENCOUNTER — Telehealth: Payer: Self-pay | Admitting: Radiation Oncology

## 2022-03-09 NOTE — Telephone Encounter (Signed)
4/18 @ 8:07 am patient's son left voicemail to r/s patient with Dr. Isidore Moos.  F/U call @ 8:30 am went straight to voicemail.  Left voicemail to confirm if 4/21 @ 9:00 am will work for patient.  Waiting on call back and put hold on schedule.   ?

## 2022-03-11 ENCOUNTER — Telehealth: Payer: Self-pay | Admitting: Radiation Oncology

## 2022-03-11 NOTE — Progress Notes (Signed)
? ?Radiation Oncology         [phone] ?________________________________ ? ?Outpatient Re-Consultation ? ?Name: [name] MRN: [id number]  ?Date: 03/12/2022  DOB: 01/08/1932 ? ?BL:TJQZES, Shanon Brow, MD  Benay Pike, MD  ? ?REFERRING PHYSICIAN: Benay Pike, MD ? ?DIAGNOSIS:  ?  ICD-10-CM   ?1. Malignant neoplasm metastatic to bone [facility])  C79.51   ?  ?2. Diffuse large B-cell lymphoma of lymph nodes of head (HCC)  C83.31   ?  ? ? ? Cancer Staging  ?Diffuse large B-cell lymphoma of lymph nodes of neck (HCC) ?Staging form: Hodgkin and Non-Hodgkin Lymphoma, AJCC 8th Edition ?- Clinical stage from 12/11/2021: Stage IV (Diffuse large B-cell lymphoma) - Signed by Eppie Gibson, MD on 12/11/2021 ?Stage prefix: Initial diagnosis ? ?Diffuse large B-cell lymphoma of nasopharynx; metastatic ? ?Now with new osseous metastases to the left humerus, left scapula, and right femoral neck ? ?Interval since last radiation: 2 months and 11 days   ? ?Intent: Curative ? ?Radiation Treatment Dates: 12/21/2021 through 01/01/2022 ?Site Technique Total Dose (Gy) Dose per Fx (Gy) Completed Fx Beam Energies  ?Nasopharynx: HN_NasoP 3D 25/25 2.5 10/10 6X  ? ?CHIEF COMPLAINT: Here to discuss management of bony metastases from nasopharyngeal cancer primary ? ?HISTORY OF PRESENT ILLNESS::[name] is a 88 y.o. female who presents today for re-evaluation and consideration of further radiation in management of new bony metastases (femur). I last met with the patient for her final radiation treatment this past February directed to residual disease in the nasopharynx.  ? ?Since then, the patient underwent a restaging PET on 02/11/22 which revealed new osseous metastatic disease, including: a new hypermetabolic medullary lesion in the proximal left humerus with an SUV max of 4.4; a new focus of hypermetabolism in the left glenoid corresponding to a 1.4 cm lucent lesion with an SUV max of 5.2; and a new hypermetabolism in the right femoral neck with  an SUV max of 4.4. PET otherwise showed interval resolution of the hypermetabolism seen previously in the posterior left nasopharynx, and stable appearing hypoattenuating liver lesions without hypermetabolism. Mild metabolic activity previously seen in the distal esophagus was also noted to have resolved in the interval.  I personally reviewed her pre and posttreatment imaging with her and her son. ? ?Pet her most recent follow-up visit with Dr. Chryl Heck on 02/19/22, the patient was noted as mostly asymptomatic. Further treatment options were discussed with the patient, though given that refractory lymphomas in general tend to have a very poor prognosis, she is not a candidate for any intensive chemotherapy regimen.  At best, Dr. Chryl Heck states that she could try her on rituximab with Gemzar, but these may not cause significant change in the quantity of her life. Dr. Chryl Heck also spoke with the patient's family who are hoping to proceed with comfort care for her at his point. In terms of RT, Dr. Chryl Heck referred the patient to me here today for consideration of radiation to the femur given that it is a major weightbearing bone.  ? ?The patient reports that her sinus issues have resolved completely since radiation.  She is in good spirits and in a wheelchair today.  She denies any bone pain. ? ?PAST MEDICAL HISTORY:  has a past medical history of Anemia, Arthritis, Cancer ([place]), GERD (gastroesophageal reflux disease), Hiatal hernia, History of inguinal hernia repair, Hyperlipidemia, Hypertension, and Memory problem.   ? ?PAST SURGICAL HISTORY: ?Past Surgical History:  ?Procedure Laterality Date  ? COLONOSCOPY    ?  HERNIA REPAIR  10/13/11  ? lap BIH rep w/ mesh- Dr. Dalbert Batman   ? [place]  10/13/2011  ? Procedure: LAPAROSCOPIC INGUINAL HERNIA;  Surgeon: Adin Hector, MD;  Location: [facility] ORS;  Service: General;  Laterality: Bilateral;  Laparoscopic bilateral inguinal hernia repair, converted to open bilateral  inguinal hernia repairs with mesh  ? IR IMAGING GUIDED PORT INSERTION  05/06/2021  ? NASAL ENDOSCOPY Left 04/08/2021  ? Procedure: NASAL ENDOSCOPY WITH BIOPSY OF LEFT NASOPPHARYNGEAL MASS;  Surgeon: Jason Coop, DO;  Location: [facility] OR;  Service: ENT;  Laterality: Left;  Frozen Section  ? ? ?FAMILY HISTORY: family history includes Heart disease in her mother. ? ?SOCIAL HISTORY:  reports that she has never smoked. She has never used smokeless tobacco. She reports that she does not drink alcohol and does not use drugs. ? ?ALLERGIES: Patient has no known allergies. ? ?MEDICATIONS:  ?Current Outpatient Medications  ?Medication Sig Dispense Refill  ? acetaminophen (TYLENOL) 500 MG tablet Take 1,000 mg by mouth every 6 (six) hours as needed for mild pain or fever.    ? allopurinol (ZYLOPRIM) 300 MG tablet Take 1 tablet (300 mg total) by mouth daily. 30 tablet 0  ? amLODipine (NORVASC) 10 MG tablet Take 10 mg by mouth daily.    ? apixaban (ELIQUIS) 5 MG TABS tablet Take 1 tablet (5 mg total) by mouth 2 (two) times daily. 60 tablet 2  ? atorvastatin (LIPITOR) 80 MG tablet Take 80 mg by mouth every morning.    ? ezetimibe (ZETIA) 10 MG tablet Take 10 mg by mouth every morning.    ? lidocaine (XYLOCAINE) 2 % solution Patient: Mix 1part 2% viscous lidocaine, 1part H20. Swish & swallow 87m of diluted mixture, 331m before meals and at bedtime, up to QID 100 mL 4  ? LORazepam (ATIVAN) 0.5 MG tablet Take 1 tablet by mouth at bedtime as needed.    ? memantine (NAMENDA) 10 MG tablet Take 1/2 tablet (5 mg) for 1 week at bedtime, then 1 tablet (10 mg) at bedtime. 27 tablet 0  ? ?No current facility-administered medications for this encounter.  ? ? ?REVIEW OF SYSTEMS:  Notable for that above. ?  ?PHYSICAL EXAM:  height is 5' (1.524 m) and weight is 115 lb 9.6 oz (52.4 kg). Her temperature is 97.6 ?F (36.4 ?C). Her blood pressure is 135/74 and her pulse is 80. Her respiration is 20 and oxygen saturation is 100%.   ?General: Alert  and oriented, in no acute distress / she is in a wheelchair ?HEENT: Head is normocephalic. Extraocular movements are intact. Oropharynx is clear. ?  ? ?ECOG = 2 ? ?0 - Asymptomatic (Fully active, able to carry on all predisease activities without restriction) ? ?1 - Symptomatic but completely ambulatory (Restricted in physically strenuous activity but ambulatory and able to carry out work of a light or sedentary nature. For example, light housework, office work) ? ?2 - Symptomatic, <50% in bed during the day (Ambulatory and capable of all self care but unable to carry out any work activities. Up and about more than 50% of waking hours) ? ?3 - Symptomatic, >50% in bed, but not bedbound (Capable of only limited self-care, confined to bed or chair 50% or more of waking hours) ? ?4 - Bedbound (Completely disabled. Cannot carry on any self-care. Totally confined to bed or chair) ? ?5 - Death ? ? Oken MM, Creech RH, Tormey DC, et al. [phone] "Toxicity and response criteria of  the [facility]". [place] Oncol. 5 (6): 649-55 ? ? ?LABORATORY DATA:  ?Lab Results  ?Component Value Date  ? WBC 4.6 01/14/2022  ? HGB 9.9 (L) 01/14/2022  ? HCT 30.6 (L) 01/14/2022  ? MCV 70.0 (L) 01/14/2022  ? PLT 224 01/14/2022  ? ?CMP  ?   ?Component Value Date/Time  ? NA 143 01/14/2022 0859  ? K 3.9 01/14/2022 0859  ? CL 107 01/14/2022 0859  ? CO2 29 01/14/2022 0859  ? GLUCOSE 90 01/14/2022 0859  ? BUN 20 01/14/2022 0859  ? CREATININE 0.58 01/14/2022 0859  ? CREATININE 0.70 11/26/2021 1502  ? CALCIUM 9.0 01/14/2022 0859  ? PROT 6.3 (L) 01/14/2022 0859  ? ALBUMIN 3.6 01/14/2022 0859  ? AST 22 01/14/2022 0859  ? AST 16 11/26/2021 1502  ? ALT 23 01/14/2022 0859  ? ALT 15 11/26/2021 1502  ? ALKPHOS 104 01/14/2022 0859  ? BILITOT 0.4 01/14/2022 0859  ? BILITOT 0.4 11/26/2021 1502  ? GFRNONAA >60 01/14/2022 0859  ? GFRNONAA >60 11/26/2021 1502  ? GFRAA >90 10/07/2011 1015  ? ?   ? ?  ?RADIOGRAPHY: NM PET Image Restag (PS)  Skull Base To Thigh ? ?Result Date: 02/14/2022 ?CLINICAL DATA:  Subsequent treatment strategy for diffuse large B-cell lymphoma. EXAM: NUCLEAR MEDICINE PET SKULL BASE TO THIGH TECHNIQUE: 5.3 mCi F-18 FDG was

## 2022-03-11 NOTE — Progress Notes (Signed)
Histology and Location of Primary Cancer:  ?Diffuse large B-cell lymphoma of nasopharynx ?  ?Sites of Visceral and Bony Metastatic Disease:  ?PET Scan ?02/11/2022 ?--IMPRESSION: ?Interval development of hypermetabolic skeletal lesions in the left humerus, left scapula, and right femoral neck. [place] 4. ?Interval resolution of the hypermetabolism seen previously in the posterior left nasopharynx. [place] 1. ?Stable hypoattenuating liver lesions without hypermetabolism on today's imaging. [place] 1. ?Mild metabolic activity seen in the distal esophagus previously has resolved in the interval. ?  ?Past/Anticipated chemotherapy by medical oncology, if any:  ?Under care of Dr. Praveena Iruku ?02/19/2022 ?Completed 6 cycles of mini RCHOP.  ?She had excellent response in all metastatic sites however had residual disease at the nasopharynx on most recent PET/CT.  ?Given her age, mentation and quality of life, we proceeded with adjuvant radiation although we have clearly discussed that this is not ideal treatment. ?She completed palliative radiation from 12/21/2021 through 01/01/2022. ?She had another PET for surveillance. ?PET showed interval development of hypermetabolic skeletal lesions in the left humerus, left scapula, right femoral neck, De[place]ville 4. ?She is not quite symptomatic however it is hard to rely on her history because she is not a good historian.  ?She listens to the conversation but it is not clear if she actually understands gravity of the situation.   ?She asks me interesting questions in the middle of a very serious conversation like what should she tell her friend who asked her some questions about her cancer which makes me believe that she does not really understand the seriousness of the situation. ?At this time, she can certainly try more chemotherapy but refractory lymphomas in general tend to have very poor prognosis.   ?And she is not a candidate for any intensive chemotherapy regimen.  At  the best I can offer her rituximab with  ?Gemzar which may not cause significant change in the quantity of her life.   ?Son and the rest of the family are hoping to proceed with comfort care at this point.   ?I will talk to Dr. Squire if there is any role for radiating the femur since this is a major weightbearing bone. ?Son will talk to the rest of the family and give me a call next week about the decision. ?  ?Pain on a scale of 0-10 is: Patient denies; Son reports that patient mentions occasional headache and stiff knees (manages with PRN Tylenol) ?   ?If Spine Met(s), symptoms, if any, include: ?Bowel/Bladder retention or incontinence (please describe): Reports occasional constipation. Also reports occasional difficulty starting her stream as well as urinary leakage  ?Numbness or weakness in extremities (please describe): Reports weakness to her legs. Son also states patient's balance is not as steady ?Current Decadron regimen, if applicable: N/A ?  ?Ambulatory status? Walker? Wheelchair?: Presents to clinic in wheelchair. Reports she uses a cane to help ambulate at home and at the store ?  ?SAFETY ISSUES: ?Prior radiation? Yes: 12/21/2021 through 01/01/2022 ?Site Technique Total Dose (Gy) Dose per Fx (Gy) Completed Fx Beam Energies  ?Nasopharynx: HN_NasoP 3D 25/25 2.5 10/10 6X  ?  ?Pacemaker/ICD? No ?Possible current pregnancy? No ?Is the patient on methotrexate? No ?  ?Current Complaints / other details:  Nothing else of note ? ?

## 2022-03-11 NOTE — Telephone Encounter (Signed)
Returned the call of a voicemail left by the son of the patient. He wanted to confirmed the patient's appointment scheduled with Dr. Isidore Moos 4/21 '@9'$ :00. Called back to confirm appointment, no answer. Unable to LVM, voice mail is full. ?

## 2022-03-12 ENCOUNTER — Ambulatory Visit
Admission: RE | Admit: 2022-03-12 | Discharge: 2022-03-12 | Disposition: A | Discharge status: Home / Self Care | Payer: Medicare HMO | Source: Ambulatory Visit | Attending: Radiation Oncology | Admitting: Radiation Oncology

## 2022-03-12 ENCOUNTER — Other Ambulatory Visit: Payer: Self-pay

## 2022-03-12 ENCOUNTER — Encounter: Payer: Self-pay | Admitting: Radiation Oncology

## 2022-03-12 VITALS — BP 135/74 | HR 80 | Temp 97.6°F | Resp 20 | Ht 60.0 in | Wt 115.6 lb

## 2022-03-12 DIAGNOSIS — E785 Hyperlipidemia, unspecified: Secondary | ICD-10-CM | POA: Diagnosis not present

## 2022-03-12 DIAGNOSIS — Z7901 Long term (current) use of anticoagulants: Secondary | ICD-10-CM | POA: Diagnosis not present

## 2022-03-12 DIAGNOSIS — Z923 Personal history of irradiation: Secondary | ICD-10-CM | POA: Insufficient documentation

## 2022-03-12 DIAGNOSIS — Z79899 Other long term (current) drug therapy: Secondary | ICD-10-CM | POA: Diagnosis not present

## 2022-03-12 DIAGNOSIS — D649 Anemia, unspecified: Secondary | ICD-10-CM | POA: Diagnosis not present

## 2022-03-12 DIAGNOSIS — C8331 Diffuse large B-cell lymphoma, lymph nodes of head, face, and neck: Secondary | ICD-10-CM | POA: Diagnosis not present

## 2022-03-12 DIAGNOSIS — C7951 Secondary malignant neoplasm of bone: Secondary | ICD-10-CM

## 2022-03-12 DIAGNOSIS — M129 Arthropathy, unspecified: Secondary | ICD-10-CM | POA: Insufficient documentation

## 2022-03-12 DIAGNOSIS — Z510 Encounter for antineoplastic radiation therapy: Secondary | ICD-10-CM | POA: Diagnosis not present

## 2022-03-12 DIAGNOSIS — I10 Essential (primary) hypertension: Secondary | ICD-10-CM | POA: Insufficient documentation

## 2022-03-12 DIAGNOSIS — K449 Diaphragmatic hernia without obstruction or gangrene: Secondary | ICD-10-CM | POA: Diagnosis not present

## 2022-03-12 DIAGNOSIS — K219 Gastro-esophageal reflux disease without esophagitis: Secondary | ICD-10-CM | POA: Insufficient documentation

## 2022-03-15 ENCOUNTER — Ambulatory Visit: Payer: Medicare HMO | Admitting: Radiation Oncology

## 2022-03-16 DIAGNOSIS — R399 Unspecified symptoms and signs involving the genitourinary system: Secondary | ICD-10-CM | POA: Diagnosis not present

## 2022-03-16 DIAGNOSIS — N3001 Acute cystitis with hematuria: Secondary | ICD-10-CM | POA: Diagnosis not present

## 2022-03-16 DIAGNOSIS — I82402 Acute embolism and thrombosis of unspecified deep veins of left lower extremity: Secondary | ICD-10-CM | POA: Diagnosis not present

## 2022-03-16 DIAGNOSIS — E782 Mixed hyperlipidemia: Secondary | ICD-10-CM | POA: Diagnosis not present

## 2022-03-16 DIAGNOSIS — C8330 Diffuse large B-cell lymphoma, unspecified site: Secondary | ICD-10-CM | POA: Diagnosis not present

## 2022-03-16 DIAGNOSIS — M109 Gout, unspecified: Secondary | ICD-10-CM | POA: Diagnosis not present

## 2022-03-16 DIAGNOSIS — G301 Alzheimer's disease with late onset: Secondary | ICD-10-CM | POA: Diagnosis not present

## 2022-03-16 DIAGNOSIS — I10 Essential (primary) hypertension: Secondary | ICD-10-CM | POA: Diagnosis not present

## 2022-03-16 DIAGNOSIS — C8331 Diffuse large B-cell lymphoma, lymph nodes of head, face, and neck: Secondary | ICD-10-CM | POA: Diagnosis not present

## 2022-03-16 DIAGNOSIS — N3281 Overactive bladder: Secondary | ICD-10-CM | POA: Diagnosis not present

## 2022-03-16 DIAGNOSIS — E559 Vitamin D deficiency, unspecified: Secondary | ICD-10-CM | POA: Diagnosis not present

## 2022-03-16 DIAGNOSIS — D6869 Other thrombophilia: Secondary | ICD-10-CM | POA: Diagnosis not present

## 2022-03-16 DIAGNOSIS — C7951 Secondary malignant neoplasm of bone: Secondary | ICD-10-CM | POA: Diagnosis not present

## 2022-03-16 DIAGNOSIS — Z510 Encounter for antineoplastic radiation therapy: Secondary | ICD-10-CM | POA: Diagnosis not present

## 2022-03-17 ENCOUNTER — Telehealth: Payer: Self-pay | Admitting: Hematology and Oncology

## 2022-03-17 NOTE — Telephone Encounter (Signed)
Scheduled appointment per 04/26 provider request. Left message.  ?

## 2022-03-19 ENCOUNTER — Ambulatory Visit
Admission: RE | Admit: 2022-03-19 | Discharge: 2022-03-19 | Disposition: A | Discharge status: Home / Self Care | Payer: Medicare HMO | Source: Ambulatory Visit | Attending: Radiation Oncology | Admitting: Radiation Oncology

## 2022-03-19 ENCOUNTER — Other Ambulatory Visit: Payer: Self-pay

## 2022-03-19 ENCOUNTER — Encounter: Payer: Self-pay | Admitting: Radiation Oncology

## 2022-03-19 DIAGNOSIS — Z510 Encounter for antineoplastic radiation therapy: Secondary | ICD-10-CM | POA: Diagnosis not present

## 2022-03-19 DIAGNOSIS — C8331 Diffuse large B-cell lymphoma, lymph nodes of head, face, and neck: Secondary | ICD-10-CM | POA: Diagnosis not present

## 2022-03-19 DIAGNOSIS — C7951 Secondary malignant neoplasm of bone: Secondary | ICD-10-CM | POA: Diagnosis not present

## 2022-03-19 LAB — RAD ONC ARIA SESSION SUMMARY
Course Elapsed Days: 0
Plan Fractions Treated to Date: 1
Plan Fractions Treated to Date: 1
Plan Prescribed Dose Per Fraction: 8 Gy
Plan Prescribed Dose Per Fraction: 8 Gy
Plan Total Fractions Prescribed: 1
Plan Total Fractions Prescribed: 1
Plan Total Prescribed Dose: 8 Gy
Plan Total Prescribed Dose: 8 Gy
Reference Point Dosage Given to Date: 8 Gy
Reference Point Dosage Given to Date: 8 Gy
Reference Point Session Dosage Given: 8 Gy
Reference Point Session Dosage Given: 8 Gy
Session Number: 1

## 2022-04-13 ENCOUNTER — Encounter: Payer: Self-pay | Admitting: Hematology and Oncology

## 2022-04-13 NOTE — Progress Notes (Signed)
                                                                                                                                                             Patient Name: [name] MRN: [id number] DOB: Dec 13, 1931 Referring Physician: Moreen Fowler DAVID (Profile Not Attached) Date of Service: 03/19/2022 [facility], [place]                                                        End Of Treatment Note  Diagnoses: C83.31-Diffuse large b-cell lymphoma, lymph nodes of head, face, and neck  Cancer Staging: STAGE IV  Intent: Palliative  Radiation Treatment Dates: 03/19/2022 through 03/19/2022 Site Technique Total Dose (Gy) Dose per Fx (Gy) Completed Fx Beam Energies  Hip, Right: Pelvis_R_hip Complex 8/8 8 1/1 10X  Humerus, Left: Ext_L_should Complex 8/8 8 1/1 6X   Narrative: The patient tolerated radiation therapy relatively well.   Plan: The patient will follow-up with radiation oncology in 31mo.  -----------------------------------  SEppie Gibson MD

## 2022-04-23 ENCOUNTER — Ambulatory Visit
Admission: RE | Admit: 2022-04-23 | Discharge: 2022-04-23 | Disposition: A | Discharge status: Home / Self Care | Payer: Medicare HMO | Source: Ambulatory Visit | Attending: Radiation Oncology | Admitting: Radiation Oncology

## 2022-04-23 DIAGNOSIS — C8339 Diffuse large B-cell lymphoma, extranodal and solid organ sites: Secondary | ICD-10-CM

## 2022-04-23 DIAGNOSIS — C8331 Diffuse large B-cell lymphoma, lymph nodes of head, face, and neck: Secondary | ICD-10-CM

## 2022-04-23 NOTE — Progress Notes (Signed)
Radiation Oncology         (336) [phone] ________________________________  Name: [name] MRN: [id number]  Date: 04/23/2022  DOB: 1932-05-17  Follow-Up Visit Note  Outpatient by telephone.  The patient opted for telemedicine to maximize safety during the pandemic.  MyChart video was not obtainable.   CC: Antony Contras, MD  Antony Contras, MD  Diagnosis and Prior Radiotherapy:    ICD-10-CM   1. Diffuse large B-cell lymphoma of lymph nodes of neck (HCC)  C83.31     2. Diffuse large B-cell lymphoma of extranodal site excluding spleen and other solid organs [facility])  C83.39      Radiation Treatment Dates: 03/19/2022 through 03/19/2022 Site Technique Total Dose (Gy) Dose per Fx (Gy) Completed Fx Beam Energies  Hip, Right: Pelvis_R_hip Complex 8/8 8 1/1 10X  Humerus, Left: Ext_L_should Complex 8/8 8 1/1 6X    CHIEF COMPLAINT: Here for follow-up and surveillance of bone cancer  Narrative:   The patient and her son elected to speak with me by phone today.  She is doing well.  She has minimal complaints.  She denies any pain except for mild intermittent headaches that resolved with Tylenol.  No sinus issues or epistaxis.  ALLERGIES:  has No Known Allergies.  Meds: Current Outpatient Medications  Medication Sig Dispense Refill   acetaminophen (TYLENOL) 500 MG tablet Take 1,000 mg by mouth every 6 (six) hours as needed for mild pain or fever.     allopurinol (ZYLOPRIM) 300 MG tablet Take 1 tablet (300 mg total) by mouth daily. 30 tablet 0   amLODipine (NORVASC) 10 MG tablet Take 10 mg by mouth daily.     apixaban (ELIQUIS) 5 MG TABS tablet Take 1 tablet (5 mg total) by mouth 2 (two) times daily. 60 tablet 2   atorvastatin (LIPITOR) 80 MG tablet Take 80 mg by mouth every morning.     ezetimibe (ZETIA) 10 MG tablet Take 10 mg by mouth every morning.     lidocaine (XYLOCAINE) 2 % solution Patient: Mix 1part 2% viscous lidocaine, 1part H20. Swish & swallow 43m of diluted mixture, 33m before  meals and at bedtime, up to QID 100 mL 4   LORazepam (ATIVAN) 0.5 MG tablet Take 1 tablet by mouth at bedtime as needed.     memantine (NAMENDA) 10 MG tablet Take 1/2 tablet (5 mg) for 1 week at bedtime, then 1 tablet (10 mg) at bedtime. 27 tablet 0   No current facility-administered medications for this encounter.    Physical Findings: The patient is in no acute distress. Patient is alert and oriented.  vitals were not taken for this visit. .      Lab Findings: Lab Results  Component Value Date   WBC 4.6 01/14/2022   HGB 9.9 (L) 01/14/2022   HCT 30.6 (L) 01/14/2022   MCV 70.0 (L) 01/14/2022   PLT 224 01/14/2022    Radiographic Findings: No results found.  Impression/Plan: She has recovered well from palliative radiation therapy.  She will see medical oncology later this month.  I will see her back on an as-needed basis.  She will continue Tylenol for her intermittent headaches and let medical oncology know if the headaches worsen in any way.  She and her son are pleased with this plan.  This encounter was provided by telemedicine platform; patient desired telemedicine during pandemic precautions.  MyChart video was not available and therefore telephone was used. The patient has given verbal consent for this type of encounter  and has been advised to only accept a meeting of this type in a secure network environment. On date of service, in total, I spent 20 minutes on this encounter.   The attendants for this meeting include Eppie Gibson  and Virgina Organ During the encounter, Eppie Gibson was located at [facility] Radiation Oncology Department.  Rod Can Romero was located at home.    _____________________________________   Eppie Gibson, MD

## 2022-05-20 ENCOUNTER — Ambulatory Visit (INDEPENDENT_AMBULATORY_CARE_PROVIDER_SITE_OTHER): Payer: Medicare HMO | Admitting: Internal Medicine

## 2022-05-20 ENCOUNTER — Encounter: Payer: Self-pay | Admitting: Hematology and Oncology

## 2022-05-20 ENCOUNTER — Encounter: Payer: Self-pay | Admitting: Internal Medicine

## 2022-05-20 ENCOUNTER — Ambulatory Visit: Payer: Medicare HMO

## 2022-05-20 ENCOUNTER — Inpatient Hospital Stay: Payer: Medicare HMO | Attending: Hematology and Oncology | Admitting: Hematology and Oncology

## 2022-05-20 ENCOUNTER — Inpatient Hospital Stay: Payer: Medicare HMO

## 2022-05-20 ENCOUNTER — Other Ambulatory Visit: Payer: Self-pay

## 2022-05-20 VITALS — BP 128/82 | HR 86 | Temp 97.7°F | Resp 16 | Wt 116.1 lb

## 2022-05-20 VITALS — BP 122/70 | HR 100 | Ht 60.0 in | Wt 116.2 lb

## 2022-05-20 DIAGNOSIS — Z79899 Other long term (current) drug therapy: Secondary | ICD-10-CM | POA: Diagnosis not present

## 2022-05-20 DIAGNOSIS — C8339 Diffuse large B-cell lymphoma, extranodal and solid organ sites: Secondary | ICD-10-CM | POA: Insufficient documentation

## 2022-05-20 DIAGNOSIS — E0590 Thyrotoxicosis, unspecified without thyrotoxic crisis or storm: Secondary | ICD-10-CM | POA: Diagnosis not present

## 2022-05-20 DIAGNOSIS — R600 Localized edema: Secondary | ICD-10-CM | POA: Insufficient documentation

## 2022-05-20 DIAGNOSIS — C8331 Diffuse large B-cell lymphoma, lymph nodes of head, face, and neck: Secondary | ICD-10-CM

## 2022-05-20 DIAGNOSIS — Z923 Personal history of irradiation: Secondary | ICD-10-CM | POA: Diagnosis not present

## 2022-05-20 DIAGNOSIS — E042 Nontoxic multinodular goiter: Secondary | ICD-10-CM

## 2022-05-20 DIAGNOSIS — I10 Essential (primary) hypertension: Secondary | ICD-10-CM | POA: Insufficient documentation

## 2022-05-20 DIAGNOSIS — C8330 Diffuse large B-cell lymphoma, unspecified site: Secondary | ICD-10-CM | POA: Diagnosis not present

## 2022-05-20 LAB — CBC WITH DIFFERENTIAL/PLATELET
Abs Immature Granulocytes: 0.01 10*3/uL (ref 0.00–0.07)
Basophils Absolute: 0 10*3/uL (ref 0.0–0.1)
Basophils Relative: 0 %
Eosinophils Absolute: 0 10*3/uL (ref 0.0–0.5)
Eosinophils Relative: 1 %
HCT: 32.4 % — ABNORMAL LOW (ref 36.0–46.0)
Hemoglobin: 10.5 g/dL — ABNORMAL LOW (ref 12.0–15.0)
Immature Granulocytes: 0 %
Lymphocytes Relative: 23 %
Lymphs Abs: 1.1 10*3/uL (ref 0.7–4.0)
MCH: 23.5 pg — ABNORMAL LOW (ref 26.0–34.0)
MCHC: 32.4 g/dL (ref 30.0–36.0)
MCV: 72.6 fL — ABNORMAL LOW (ref 80.0–100.0)
Monocytes Absolute: 0.4 10*3/uL (ref 0.1–1.0)
Monocytes Relative: 8 %
Neutro Abs: 3.2 10*3/uL (ref 1.7–7.7)
Neutrophils Relative %: 68 %
Platelets: 219 10*3/uL (ref 150–400)
RBC: 4.46 MIL/uL (ref 3.87–5.11)
RDW: 15.9 % — ABNORMAL HIGH (ref 11.5–15.5)
WBC: 4.8 10*3/uL (ref 4.0–10.5)
nRBC: 0 % (ref 0.0–0.2)

## 2022-05-20 LAB — COMPREHENSIVE METABOLIC PANEL
ALT: 28 U/L (ref 0–44)
AST: 26 U/L (ref 15–41)
Albumin: 4.1 g/dL (ref 3.5–5.0)
Alkaline Phosphatase: 103 U/L (ref 38–126)
Anion gap: 8 (ref 5–15)
BUN: 15 mg/dL (ref 8–23)
CO2: 29 mmol/L (ref 22–32)
Calcium: 9.4 mg/dL (ref 8.9–10.3)
Chloride: 107 mmol/L (ref 98–111)
Creatinine, Ser: 0.48 mg/dL (ref 0.44–1.00)
GFR, Estimated: >60 mL/min (ref >60)
Glucose, Bld: 91 mg/dL (ref 70–99)
Potassium: 3.4 mmol/L — ABNORMAL LOW (ref 3.5–5.1)
Sodium: 144 mmol/L (ref 135–145)
Total Bilirubin: 0.4 mg/dL (ref 0.3–1.2)
Total Protein: 7.1 g/dL (ref 6.5–8.1)

## 2022-05-20 LAB — TSH: TSH: 0.95 u[IU]/mL (ref 0.35–5.50)

## 2022-05-20 LAB — LACTATE DEHYDROGENASE: LDH: 218 U/L — ABNORMAL HIGH (ref 98–192)

## 2022-05-20 LAB — T4, FREE
Free T4: 0.9 ng/dL (ref 0.60–1.60)
Free T4: 0.88 ng/dL (ref 0.61–1.12)

## 2022-05-20 NOTE — Progress Notes (Signed)
[name]  Patient Care Team: Antony Contras, MD as PCP - General (Family Medicine) Benay Pike, MD as Consulting Physician (Hematology and Oncology) Eppie Gibson, MD as Consulting Physician (Radiation Oncology) Malmfelt, Stephani Police, RN as Oncology Nurse Navigator  CHIEF COMPLAINTS/PURPOSE OF CONSULTATION:  DLBCL of nasopharynx.  ASSESSMENT & PLAN:   GCB subtype, DLBCL of nasopharynx, Stage IV  Diffuse large B-cell lymphoma (HCC) This is a very pleasant 88 year old female patient with diffuse large B-cell lymphoma of the nasopharynx with a metastatic disease noticed on imaging who is here for a follow-up after completing 6 cycles of mini RCHOP.   She had excellent response in all metastatic sites however had residual disease at the nasopharynx on most recent PET/CT. Given her age, mentation and quality of life, we proceeded with adjuvant radiation although we have clearly discussed that this is not ideal treatment. She completed palliative radiation from 12/21/2021 through 01/01/2022. She had another PET for surveillance. PET showed interval development of hypermetabolic skeletal lesions in the left humerus, left scapula, right femoral neck, Deauville 4. She is not quite symptomatic however it is hard to rely on her history because she is not a good historian.  She listens to the conversation but it is not clear if she actually understands gravity of the situation.  She asks me interesting questions in the middle of a very serious conversation like what should she tell her friend who asked her some questions about her cancer which makes me believe that she does not really understand the seriousness of the situation. At this time, she can certainly try more chemotherapy but refractory lymphomas in general tend to have very poor prognosis.  And she is not a candidate for any intensive chemotherapy regimen.  At the best I can offer her rituximab with Gemzar  which may not cause significant change in the quantity of her life.    During our last visit, son and family discussed about comfort care and palliative radiation. She was last treated at the right hip and left humerus by Dr. Isidore Moos.  She is doing well from the standpoint. She is here for follow-up. She complains of headaches. PE unremarkable today, BLE edema 2+.  No palpable lymphadenopathy Will consider MRI of the brain for further evaluation of headaches. We have previously and today discussed that she may not be a candidate for aggressive treatment, family is agreeable to all the recommendations.  Labs today, CBCs, CMP and LDH ordered Return to clinic in about 3 weeks for televisit to discuss MRI results.  HISTORY OF PRESENTING ILLNESS:   [name] 88 y.o. female is here because of new diagnosis of DLBCL of nasopharynx.  Ms. Huether is a very pleasant 88 year old female patient who arrived today with her granddaughter Ms. Amber to the appointment.  She does not quite remember things as explained and has short-term memory loss.  After a bit of the conversation, she did admit to me that she was told about a cancer in her nose which was biopsied last week but she cannot remember the details.  [name] mentioned that for the past year or so grandma has been having more nasal drainage and has progressively lost weight in the past couple months of at least 20 pounds, has become relatively more sedentary and inactive, has no appetite.  They initially thought she might be having a stroke when she was not getting out of bed for 3days and has not been eating.  When  she was in the ER.  She had some imaging CT head without contrast which showed left maxillary and ethmoid sinus disease. She then had MRI brain on the same day which showed 3.2 x 2 x 3.5 cm heterogeneous mass positioned at the left nasopharynx near the fossa of Rosenmuller.  Finding is most concerning for a primary nasopharyngeal neoplasm.  ENT  consultation was recommended.  She then went on to see [name].She had nasal endoscopy with biopsy of left nasopharyngeal mass.  According to [name], polypoid mass lesion was noted to be lymphoma from the posterior aspect of the left middle turbinate.  Pathology showed findings consistent with diffuse large B-cell lymphoma of the left nasopharyngeal mass as well as left middle turbinate.   PET/CT showed hypermetabolic left nasopharyngeal mass, no associated neck adenopathy, 2 large hypermetabolic liver lesions, destructive lytic metastatic bone lesions.  Since there was no lymphadenopathy and no unusual appearance of spleen, second biopsy was recommended to confirm the diagnosis.  Left ilium  Biopsy showed findings consistent with involvement by non-Hodgkin B-cell lymphoma.  Features are similar to previously known large B-cell lymphoma consistent with involvement by the same disease process.  Cycle day 1 of R mini CHOP on 06/03/2021. C2D1 06/23/2021 Cycle 3-day 1 completed on July 15, 2021 She had an interim PET/CT which showed remarkable response. C4D1 on 08/05/2021 C5D1 on 08/26/2021 C6D1 09/18/2021 PET/CT 10/23/2021 showed persistent hypermetabolic thickening in the posterior left nasopharynx, otherwise interval decrease in size of hepatic lesions without metabolic activity, no metabolic activity in the lytic skeletal lesions.  Hashemi segment of mild metabolic activity in the esophagus suggest benign esophagitis.  She completed radiation to the nasopharynx on 01/01/2022.  PET showed interval development of hypermetabolic skeletal lesions in the left humerus, left scapula, right femoral neck, Deauville 4. No other sites concerning for disease relapse.  It appears family and considered that palliative radiation and she received right hip and left humerus radiation back in April 2023  Interval History  She is here with her son to the appointment.  She is in a wheelchair.  She  continues to do well however has noted new onset headaches which were once or twice a week in the past but now happened to be daily.  She has been taking Tylenol for headaches.  No other B symptoms.  No new bone pains.  Appetite is great.  No falls, change in strength.  Rest of the pertinent 10 point ROS reviewed and negative  MEDICAL HISTORY:  Past Medical History:  Diagnosis Date   Anemia    Arthritis    knees    Cancer ([place])    GERD (gastroesophageal reflux disease)    Hiatal hernia    History of inguinal hernia repair    BIH   Hyperlipidemia    Hypertension    Memory problem     SURGICAL HISTORY: Past Surgical History:  Procedure Laterality Date   COLONOSCOPY     HERNIA REPAIR  10/13/11   lap BIH rep w/ mesh- Dr. Tora Kindred HERNIA REPAIR  10/13/2011   Procedure: LAPAROSCOPIC INGUINAL HERNIA;  Surgeon: Adin Hector, MD;  Location: [facility];  Service: General;  Laterality: Bilateral;  Laparoscopic bilateral inguinal hernia repair, converted to open bilateral inguinal hernia repairs with mesh   IR IMAGING GUIDED PORT INSERTION  05/06/2021   NASAL ENDOSCOPY Left 04/08/2021   Procedure: NASAL ENDOSCOPY WITH BIOPSY OF LEFT NASOPPHARYNGEAL MASS;  Surgeon: Ebbie Latus A, DO;  Location: [facility];  Service: ENT;  Laterality: Left;  Frozen Section    SOCIAL HISTORY: Social History   Socioeconomic History   Marital status: Single    Spouse name: Not on file   Number of children: Not on file   Years of education: Not on file   Highest education level: Not on file  Occupational History   Not on file  Tobacco Use   Smoking status: Never   Smokeless tobacco: Never  Vaping Use   Vaping Use: Never used  Substance and Sexual Activity   Alcohol use: No   Drug use: No   Sexual activity: Not Currently  Other Topics Concern   Not on file  Social History Narrative   Not on file   Social Determinants of Health   Financial Resource Strain: Not on file  Food Insecurity:  Not on file  Transportation Needs: Not on file  Physical Activity: Not on file  Stress: Not on file  Social Connections: Not on file  Intimate Partner Violence: Not on file    FAMILY HISTORY: Family History  Problem Relation Age of Onset   Heart disease Mother     ALLERGIES:  has No Known Allergies.  MEDICATIONS:  Current Outpatient Medications  Medication Sig Dispense Refill   acetaminophen (TYLENOL) 500 MG tablet Take 1,000 mg by mouth every 6 (six) hours as needed for mild pain or fever.     allopurinol (ZYLOPRIM) 300 MG tablet Take 1 tablet (300 mg total) by mouth daily. 30 tablet 0   amLODipine (NORVASC) 10 MG tablet Take 10 mg by mouth daily.     apixaban (ELIQUIS) 5 MG TABS tablet Take 1 tablet (5 mg total) by mouth 2 (two) times daily. 60 tablet 2   atorvastatin (LIPITOR) 80 MG tablet Take 80 mg by mouth every morning.     ezetimibe (ZETIA) 10 MG tablet Take 10 mg by mouth every morning.     lidocaine (XYLOCAINE) 2 % solution Patient: Mix 1part 2% viscous lidocaine, 1part H20. Swish & swallow 85m of diluted mixture, 362m before meals and at bedtime, up to QID 100 mL 4   LORazepam (ATIVAN) 0.5 MG tablet Take 1 tablet by mouth at bedtime as needed.     memantine (NAMENDA) 10 MG tablet Take 1/2 tablet (5 mg) for 1 week at bedtime, then 1 tablet (10 mg) at bedtime. 27 tablet 0   No current facility-administered medications for this visit.    PHYSICAL EXAMINATION:  ECOG PERFORMANCE STATUS: 1/2  Physical Exam Constitutional:      Appearance: Normal appearance.  HENT:     Head: Normocephalic and atraumatic.  Eyes:     Pupils: Pupils are equal, round, and reactive to light.  Neck:     Comments: Palpable thyroid nodules on exam, endocrinology following Cardiovascular:     Rate and Rhythm: Normal rate and regular rhythm.     Pulses: Normal pulses.     Heart sounds: Normal heart sounds.  Pulmonary:     Effort: Pulmonary effort is normal.     Breath sounds: Normal  breath sounds.  Abdominal:     General: Abdomen is flat. Bowel sounds are normal.     Palpations: Abdomen is soft.  Musculoskeletal:        General: Swelling (Bilateral lower extremity swelling 2+) present. No tenderness or deformity.     Cervical back: Normal range of motion and neck supple. No rigidity.  Lymphadenopathy:     Cervical: No cervical adenopathy.  Skin:    General: Skin is warm and dry.  Neurological:     General: No focal deficit present.     Mental Status: She is alert.  Psychiatric:        Mood and Affect: Mood normal.      LABORATORY DATA:  I have reviewed the data as listed Lab Results  Component Value Date   WBC 4.6 01/14/2022   HGB 9.9 (L) 01/14/2022   HCT 30.6 (L) 01/14/2022   MCV 70.0 (L) 01/14/2022   PLT 224 01/14/2022     Chemistry      Component Value Date/Time   NA 143 01/14/2022 0859   K 3.9 01/14/2022 0859   CL 107 01/14/2022 0859   CO2 29 01/14/2022 0859   BUN 20 01/14/2022 0859   CREATININE 0.58 01/14/2022 0859   CREATININE 0.70 11/26/2021 1502      Component Value Date/Time   CALCIUM 9.0 01/14/2022 0859   ALKPHOS 104 01/14/2022 0859   AST 22 01/14/2022 0859   AST 16 11/26/2021 1502   ALT 23 01/14/2022 0859   ALT 15 11/26/2021 1502   BILITOT 0.4 01/14/2022 0859   BILITOT 0.4 11/26/2021 1502      RADIOGRAPHIC STUDIES: No results found.  All questions were answered. The patient knows to call the clinic with any problems, questions or concerns.  I spent 30 minutes in the care of this patient including reviewing plan of care, labs, discussion about follow-up and treatment plan.  We have discussed about PET imaging results, prognosis, treatment options role of palliative care and hospice.  Benay Pike MD

## 2022-05-20 NOTE — Progress Notes (Signed)
Name: [name]  MRN/ DOB: [id number], [birth year]    Age/ Sex: 88 y.o., female    PCP: Antony Contras, MD   Reason for Endocrinology Evaluation: Multinodular goiter     Date of Initial Endocrinology Evaluation: 02/12/2022    HPI: Ms. [name] is a 88 y.o. female with a past medical history of HTN, MNG, lymphoma. The patient presented for initial endocrinology clinic visit on 02/12/2022 for consultative assistance with her multinodular goiter.   She has been diagnosed with multinodular goiter on 11/2021 following thyroid ultrasound.  The right inferior and superior isthmic nodule met criteria for FNA which was done on 12/24/2021.  Right thyroid nodule cytology was benign and the superior isthmic nodule cytology was consistent with atypia of undetermined significance (Bethesda category III) with benign Afirma.  Patient also has been noted with a low TSH at 0.245 uIU/mL with normal free T4 on 06/30/2021.  Repeat testing 01/2022 showed normalization of TFT's    TRAB detectable 1.62 IU/L   No family history of thyroid disease    SUBJECTIVE:   Today (05/20/22:  Ms. Mazurkiewicz is here for a follow up on MNG.  Weight has been stable since her last visit here.   She is accompanied by her  son today  Denies palpitations  Denies loose stools or diarrhea , she had constipation  Denies hand tremors  Has noted local neck swelling     HISTORY:  Past Medical History:  Past Medical History:  Diagnosis Date   Anemia    Arthritis    knees    Cancer ([place])    GERD (gastroesophageal reflux disease)    Hiatal hernia    History of inguinal hernia repair    BIH   Hyperlipidemia    Hypertension    Memory problem    Past Surgical History:  Past Surgical History:  Procedure Laterality Date   COLONOSCOPY     HERNIA REPAIR  10/13/11   lap BIH rep w/ mesh- Dr. Tora Kindred HERNIA REPAIR  10/13/2011   Procedure: LAPAROSCOPIC INGUINAL HERNIA;  Surgeon: Adin Hector, MD;  Location:  [facility];  Service: General;  Laterality: Bilateral;  Laparoscopic bilateral inguinal hernia repair, converted to open bilateral inguinal hernia repairs with mesh   IR IMAGING GUIDED PORT INSERTION  05/06/2021   NASAL ENDOSCOPY Left 04/08/2021   Procedure: NASAL ENDOSCOPY WITH BIOPSY OF LEFT NASOPPHARYNGEAL MASS;  Surgeon: Jason Coop, DO;  Location: [facility];  Service: ENT;  Laterality: Left;  Frozen Section    Social History:  reports that she has never smoked. She has never used smokeless tobacco. She reports that she does not drink alcohol and does not use drugs. Family History: family history includes Heart disease in her mother.   HOME MEDICATIONS: Allergies as of 05/20/2022   No Known Allergies      Medication List        Accurate as of May 20, 2022  8:02 AM. If you have any questions, ask your nurse or doctor.          acetaminophen 500 MG tablet Commonly known as: TYLENOL Take 1,000 mg by mouth every 6 (six) hours as needed for mild pain or fever.   allopurinol 300 MG tablet Commonly known as: ZYLOPRIM Take 1 tablet (300 mg total) by mouth daily.   amLODipine 10 MG tablet Commonly known as: NORVASC Take 10 mg by mouth daily.   apixaban 5 MG Tabs tablet Commonly  known as: ELIQUIS Take 1 tablet (5 mg total) by mouth 2 (two) times daily.   atorvastatin 80 MG tablet Commonly known as: LIPITOR Take 80 mg by mouth every morning.   ezetimibe 10 MG tablet Commonly known as: ZETIA Take 10 mg by mouth every morning.   lidocaine 2 % solution Commonly known as: XYLOCAINE Patient: Mix 1part 2% viscous lidocaine, 1part H20. Swish & swallow 2m of diluted mixture, 352m before meals and at bedtime, up to QID   LORazepam 0.5 MG tablet Commonly known as: ATIVAN Take 1 tablet by mouth at bedtime as needed.   memantine 10 MG tablet Commonly known as: NAMENDA Take 1/2 tablet (5 mg) for 1 week at bedtime, then 1 tablet (10 mg) at bedtime.          REVIEW OF  SYSTEMS: A comprehensive ROS was conducted with the patient and is negative except as per HPI    OBJECTIVE:  VS: BP 122/70 (BP Location: Left Arm, Patient Position: Sitting, Cuff Size: Small)   Pulse 100   Ht 5' (1.524 m)   Wt 116 lb 3.2 oz (52.7 kg)   SpO2 97%   BMI 22.69 kg/m    Wt Readings from Last 3 Encounters:  05/20/22 116 lb 3.2 oz (52.7 kg)  03/12/22 115 lb 9.6 oz (52.4 kg)  02/19/22 107 lb 8 oz (48.8 kg)     EXAM: General: Pt appears well and is in NAD, patient in a wheelchair  Neck: General: Supple without adenopathy. Thyroid: Thyroid size normal.  Isthmic nodule appreciated.   Lungs: Clear with good BS bilat with no rales, rhonchi, or wheezes  Heart: Auscultation: RRR.  Abdomen: soft, nontender  Extremities:  BL LE: Trace pretibial edema   Mental Status: Judgment, insight: Intact Orientation: Oriented to time, place, and person Mood and affect: No depression, anxiety, or agitation     DATA REVIEWED:  Latest Reference Range & Units 05/20/22 09:25  Glucose 70 - 99 mg/dL 91  T4,Free(Direct) 0.61 - 1.12 ng/dL 0.88     Latest Reference Range & Units 02/12/22 11:08  TRAB <=2.00 IU/L 1.62       Latest Reference Range & Units 01/14/22 08:59  Sodium 135 - 145 mmol/L 143  Potassium 3.5 - 5.1 mmol/L 3.9  Chloride 98 - 111 mmol/L 107  CO2 22 - 32 mmol/L 29  Glucose 70 - 99 mg/dL 90  BUN 8 - 23 mg/dL 20  Creatinine 0.44 - 1.00 mg/dL 0.58  Calcium 8.9 - 10.3 mg/dL 9.0  Anion gap 5 - 15  7  Alkaline Phosphatase 38 - 126 U/L 104  Albumin 3.5 - 5.0 g/dL 3.6  AST 15 - 41 U/L 22  ALT 0 - 44 U/L 23  Total Protein 6.5 - 8.1 g/dL 6.3 (L)    Latest Reference Range & Units 01/14/22 08:26  WBC 4.0 - 10.5 K/uL 4.6  RBC 3.87 - 5.11 MIL/uL 4.37  Hemoglobin 12.0 - 15.0 g/dL 9.9 (L)  HCT 36.0 - 46.0 % 30.6 (L)  MCV 80.0 - 100.0 fL 70.0 (L)  MCH 26.0 - 34.0 pg 22.7 (L)  MCHC 30.0 - 36.0 g/dL 32.4  RDW 11.5 - 15.5 % 16.9 (H)  Platelets 150 - 400 K/uL 224  nRBC 0.0  - 0.2 % 0.0  Neutrophils % 48  Lymphocytes % 39  Monocytes Relative % 8  Eosinophil % 5  Basophil % 0  Immature Granulocytes % 0  NEUT# 1.7 - 7.7 K/uL 2.2  Lymphocyte #  0.7 - 4.0 K/uL 1.8  Monocyte # 0.1 - 1.0 K/uL 0.4  Eosinophils Absolute 0.0 - 0.5 K/uL 0.2  Basophils Absolute 0.0 - 0.1 K/uL 0.0  Abs Immature Granulocytes 0.00 - 0.07 K/uL 0.01      Thyroid ULtrasound 12/11/2021   Estimated total number of nodules >/= 1 cm: 2   Number of spongiform nodules >/=  2 cm not described below (TR1): 0   Number of mixed cystic and solid nodules >/= 1.5 cm not described below ([place]): 0   _________________________________________________________   Nodule # 1:   Location: Right; Inferior   Maximum size: 3.4 cm; Other 2 dimensions: 1.2 x 2.8 cm   Composition: solid/almost completely solid (2)   Echogenicity: isoechoic (1)   Shape: not taller-than-wide (0)   Margins: ill-defined (0)   Echogenic foci: none (0)   ACR TI-RADS total points: 3.   ACR TI-RADS risk category: TR3 (3 points).   ACR TI-RADS recommendations:   **Given size (>/= 2.5 cm) and appearance, fine needle aspiration of this mildly suspicious nodule should be considered based on TI-RADS criteria.   _________________________________________________________   Nodule # 2:   Location: Isthmus; Superior   Maximum size: 1.7 cm; Other 2 dimensions: 1.5 x 1.3 cm   Composition: solid/almost completely solid (2)   Echogenicity: hypoechoic (2)   Shape: not taller-than-wide (0)   Margins: ill-defined (0)   Echogenic foci: macrocalcifications (1)   ACR TI-RADS total points: 5.   ACR TI-RADS risk category: TR4 (4-6 points).   ACR TI-RADS recommendations:   **Given size (>/= 1.5 cm) and appearance, fine needle aspiration of this moderately suspicious nodule should be considered based on TI-RADS criteria.   _________________________________________________________   IMPRESSION: 1. Approximately 3.4  cm TI-RADS category 3 nodule in the right inferior gland meets criteria to consider fine-needle aspiration biopsy. 2. Approximately 1.7 cm TI-RADS category 4 nodule in the superior aspect of the thyroid isthmus also meets criteria to consider fine-needle aspiration biopsy. 3. The thyroid gland is diffusely heterogeneous and mildly enlarged.      FNA right inferior nodule 12/24/2021  Clinical History: Location: Right; Inferior, Maximum Size: 3.4 cm; Other  2 dimensions: 1.2 x 2.8 cm, solid/almost completely solid (2), Isoechoic  (1), ACR TI-RADS Total Points 3.  Specimen Submitted:  A. THYROID, RIGHT LOBE, FINE NEEDLE ASPIRATION:    FINAL MICROSCOPIC DIAGNOSIS:  - Consistent with benign follicular nodule (Bethesda category II)     FNA superior isthmic nodule 12/24/2021   Clinical History: Location: Isthmus; Superior, Maximum Size: 1.7cm;  Other 2 dimensions: 1.5 x 1.3 cm, solid/almost completely solid (2),  Hypoechoic (2), ACR TI-RADS Total Points 5.  Specimen Submitted:  A. THYROID, ISTHMUS, FINE NEEDLE ASPIRATION:    FINAL MICROSCOPIC DIAGNOSIS:  - Atypia of undetermined significance (Bethesda category III)   Afirma benign    Old records , labs and images have been reviewed.    ASSESSMENT/PLAN/RECOMMENDATIONS:   Multinodular goiter   -No local neck symptoms -S/P benign FNA of the right inferior nodule, FNA of the isthmic nodule came back initially as atypia of undetermined significance(Bethesda category III) but Afirma was benign -Patient will need annual thyroid ultrasound for stability, next ultrasound will be in 2024    2.  Subclinical hyperthyroidism:  -TFTs continue to be normal -TRAb came back detectable but not elevated in the past   Follow-up 1 year  Signed electronically by: Mack Guise, MD  [facility]  [facility] [address]  [place], [place] [address] Phone: [phone] FAX:  [phone]   CC: Antony Contras, [place] [address] Phone: [phone] Fax: [phone]   Return to Endocrinology clinic as below: Future Appointments  Date Time Provider [place]  05/20/2022  8:45 AM Benay Pike, MD [facility] None  05/20/2022  9:15 AM [facility] LAB [facility] None

## 2022-06-06 ENCOUNTER — Ambulatory Visit (HOSPITAL_COMMUNITY)
Admission: RE | Admit: 2022-06-06 | Discharge: 2022-06-06 | Disposition: A | Discharge status: Home / Self Care | Payer: Medicare HMO | Source: Ambulatory Visit | Attending: Hematology and Oncology | Admitting: Hematology and Oncology

## 2022-06-06 DIAGNOSIS — C8330 Diffuse large B-cell lymphoma, unspecified site: Secondary | ICD-10-CM | POA: Insufficient documentation

## 2022-06-06 DIAGNOSIS — R519 Headache, unspecified: Secondary | ICD-10-CM | POA: Diagnosis not present

## 2022-06-06 MED ORDER — GADOBUTROL 1 MMOL/ML IV SOLN
5.0000 mL | Freq: Once | INTRAVENOUS | Status: AC | PRN
  Administered 2022-06-06: 5 mL via INTRAVENOUS

## 2022-06-09 ENCOUNTER — Telehealth: Payer: Self-pay | Admitting: *Deleted

## 2022-06-09 NOTE — Telephone Encounter (Signed)
Pt's son- Jenny Reichmann- left message inquiring if current " telephone visit " is still appropriate or should pt come in for an actual physical visit.  Note pt had MRI of head done Sunday.  This RN attempted to return call and obtained his VM-message stating overall stable reading of MRI- and requested a call to inquire further if he feels his mother needs a physical in person visit.

## 2022-06-09 NOTE — Telephone Encounter (Signed)
This RN spoke with Jenny Reichmann- discussed his concerns with ongoing headache and mild light headedness- per discussion he feels pt being seen would be more valuable then a phone visit.   Per above symptoms - and known left lower sinus mass- possible interference with drainage of sinus- Jenny Reichmann will have his mother try claritin for possible benefit.

## 2022-06-17 ENCOUNTER — Other Ambulatory Visit: Payer: Self-pay

## 2022-06-17 ENCOUNTER — Inpatient Hospital Stay: Payer: Medicare HMO | Attending: Hematology and Oncology | Admitting: Hematology and Oncology

## 2022-06-17 ENCOUNTER — Encounter: Payer: Self-pay | Admitting: Hematology and Oncology

## 2022-06-17 VITALS — BP 155/87 | HR 87 | Temp 97.9°F | Resp 17 | Ht 60.0 in | Wt 116.8 lb

## 2022-06-17 DIAGNOSIS — C8330 Diffuse large B-cell lymphoma, unspecified site: Secondary | ICD-10-CM

## 2022-06-17 DIAGNOSIS — Z79899 Other long term (current) drug therapy: Secondary | ICD-10-CM | POA: Diagnosis not present

## 2022-06-17 DIAGNOSIS — Z923 Personal history of irradiation: Secondary | ICD-10-CM | POA: Insufficient documentation

## 2022-06-17 DIAGNOSIS — Z8572 Personal history of non-Hodgkin lymphomas: Secondary | ICD-10-CM | POA: Diagnosis not present

## 2022-06-17 NOTE — Progress Notes (Signed)
[place] NOTE  Patient Care Team: Antony Contras, MD as PCP - General (Family Medicine) Benay Pike, MD as Consulting Physician (Hematology and Oncology) Eppie Gibson, MD as Consulting Physician (Radiation Oncology) Malmfelt, Stephani Police, RN as Oncology Nurse Navigator  CHIEF COMPLAINTS/PURPOSE OF CONSULTATION:  DLBCL of nasopharynx.  ASSESSMENT & PLAN:   GCB subtype, DLBCL of nasopharynx, Stage IV  Diffuse large B-cell lymphoma (HCC) This is a very pleasant 88 year old female patient with diffuse large B-cell lymphoma of the nasopharynx with a metastatic disease noticed on imaging who is here for a follow-up after completing 6 cycles of mini RCHOP.   She had excellent response in all metastatic sites however had residual disease at the nasopharynx on most recent PET/CT. Given her age, mentation and quality of life, we proceeded with adjuvant radiation although we have clearly discussed that this is not ideal treatment. She completed palliative radiation from 12/21/2021 through 01/01/2022. She had another PET for surveillance. PET showed interval development of hypermetabolic skeletal lesions in the left humerus, left scapula, right femoral neck, Deauville 4. She underwent palliative radiation to the right hip and left humerus back in April 2023. She was most recently seen because of some ongoing headaches and we have repeated an MRI brain which once again shows mass with lymphoma type features in the lower left nasal cavity measuring 18 x 8 mm.  No evidence for intracranial tumor.    We have today discussed about considering systemic staging if we are going to consider treatment.  Given her advanced age, underlying comorbidities and worsening performance status, patient herself agrees that she will not be able to take chemotherapy at this point.  She also complained of right hip pain and I worry about recurrence since this was one of the sites of her disease in  the past.  We have discussed about palliative care and hospice given the above-mentioned findings.  She is agreeable to this.  She would like to have some peace and stay comfortable.  Referral will be placed to hospice.  We will be available for any further information.  I have also recommended to stop the blood thinner since she is having some major nosebleeds and since she will be moving forward with hospice.  Son and patient agreeable and expressed understanding of the recommendations.  HISTORY OF PRESENTING ILLNESS:   [name] 88 y.o. female is here because of new diagnosis of DLBCL of nasopharynx.  [name] is a very pleasant 88 year old female patient who arrived today with her granddaughter [name] to the appointment.  She does not quite remember things as explained and has short-term memory loss.  After a bit of the conversation, she did admit to me that she was told about a cancer in her nose which was biopsied last week but she cannot remember the details.  [name] mentioned that for the past year or so grandma has been having more nasal drainage and has progressively lost weight in the past couple months of at least 20 pounds, has become relatively more sedentary and inactive, has no appetite.  They initially thought she might be having a stroke when she was not getting out of bed for 3days and has not been eating.  When she was in the ER.  She had some imaging CT head without contrast which showed left maxillary and ethmoid sinus disease. She then had MRI brain on the same day which showed 3.2 x 2 x 3.5 cm heterogeneous mass  positioned at the left nasopharynx near the fossa of Rosenmuller.  Finding is most concerning for a primary nasopharyngeal neoplasm.  ENT consultation was recommended.  She then went on to see [name].She had nasal endoscopy with biopsy of left nasopharyngeal mass.  According to [name], polypoid mass lesion was noted to be lymphoma from the posterior  aspect of the left middle turbinate.  Pathology showed findings consistent with diffuse large B-cell lymphoma of the left nasopharyngeal mass as well as left middle turbinate.   PET/CT showed hypermetabolic left nasopharyngeal mass, no associated neck adenopathy, 2 large hypermetabolic liver lesions, destructive lytic metastatic bone lesions.  Since there was no lymphadenopathy and no unusual appearance of spleen, second biopsy was recommended to confirm the diagnosis.  Left ilium  Biopsy showed findings consistent with involvement by non-Hodgkin B-cell lymphoma.  Features are similar to previously known large B-cell lymphoma consistent with involvement by the same disease process.  Cycle day 1 of R mini CHOP on 06/03/2021. C2D1 06/23/2021 Cycle 3-day 1 completed on July 15, 2021 She had an interim PET/CT which showed remarkable response. C4D1 on 08/05/2021 C5D1 on 08/26/2021 C6D1 09/18/2021 PET/CT 10/23/2021 showed persistent hypermetabolic thickening in the posterior left nasopharynx, otherwise interval decrease in size of hepatic lesions without metabolic activity, no metabolic activity in the lytic skeletal lesions.  Hardwick segment of mild metabolic activity in the esophagus suggest benign esophagitis.  She completed radiation to the nasopharynx on 01/01/2022.  PET showed interval development of hypermetabolic skeletal lesions in the left humerus, left scapula, right femoral neck, Deauville 4. No other sites concerning for disease relapse.  It appears family and considered that palliative radiation and she received right hip and left humerus radiation back in April 2023  Interval History  She is here with her son to the appointment.  She is in a wheelchair.  Since last visit she complains of worsening headaches and also has noticed some right hip pain.  She had a major nosebleed yesterday and also had some nosebleed while she was at the appointment today.  She also feels very weak, declining  in the past 2 weeks according to family. Rest of the pertinent 10 point ROS reviewed and negative. MEDICAL HISTORY:  Past Medical History:  Diagnosis Date   Anemia    Arthritis    knees    Cancer (Grandfather)    GERD (gastroesophageal reflux disease)    Hiatal hernia    History of inguinal hernia repair    BIH   Hyperlipidemia    Hypertension    Memory problem     SURGICAL HISTORY: Past Surgical History:  Procedure Laterality Date   COLONOSCOPY     HERNIA REPAIR  10/13/11   lap BIH rep w/ mesh- Dr. Tora Kindred HERNIA REPAIR  10/13/2011   Procedure: LAPAROSCOPIC INGUINAL HERNIA;  Surgeon: Adin Hector, MD;  Location: [facility];  Service: General;  Laterality: Bilateral;  Laparoscopic bilateral inguinal hernia repair, converted to open bilateral inguinal hernia repairs with mesh   IR IMAGING GUIDED PORT INSERTION  05/06/2021   NASAL ENDOSCOPY Left 04/08/2021   Procedure: NASAL ENDOSCOPY WITH BIOPSY OF LEFT NASOPPHARYNGEAL MASS;  Surgeon: Jason Coop, DO;  Location: [facility];  Service: ENT;  Laterality: Left;  Frozen Section    SOCIAL HISTORY: Social History   Socioeconomic History   Marital status: Single    Spouse name: Not on file   Number of children: Not on file   Years of  education: Not on file   Highest education level: Not on file  Occupational History   Not on file  Tobacco Use   Smoking status: Never   Smokeless tobacco: Never  Vaping Use   Vaping Use: Never used  Substance and Sexual Activity   Alcohol use: No   Drug use: No   Sexual activity: Not Currently  Other Topics Concern   Not on file  Social History Narrative   Not on file   Social Determinants of Health   Financial Resource Strain: Not on file  Food Insecurity: Not on file  Transportation Needs: Not on file  Physical Activity: Not on file  Stress: Not on file  Social Connections: Not on file  Intimate Partner Violence: Not on file    FAMILY HISTORY: Family History  Problem  Relation Age of Onset   Heart disease Mother     ALLERGIES:  has No Known Allergies.  MEDICATIONS:  Current Outpatient Medications  Medication Sig Dispense Refill   acetaminophen (TYLENOL) 500 MG tablet Take 1,000 mg by mouth every 6 (six) hours as needed for mild pain or fever.     allopurinol (ZYLOPRIM) 300 MG tablet Take 1 tablet (300 mg total) by mouth daily. 30 tablet 0   amLODipine (NORVASC) 10 MG tablet Take 10 mg by mouth daily.     apixaban (ELIQUIS) 5 MG TABS tablet Take 1 tablet (5 mg total) by mouth 2 (two) times daily. 60 tablet 2   atorvastatin (LIPITOR) 80 MG tablet Take 80 mg by mouth every morning.     ezetimibe (ZETIA) 10 MG tablet Take 10 mg by mouth every morning.     lidocaine (XYLOCAINE) 2 % solution Patient: Mix 1part 2% viscous lidocaine, 1part H20. Swish & swallow 13m of diluted mixture, 363m before meals and at bedtime, up to QID 100 mL 4   LORazepam (ATIVAN) 0.5 MG tablet Take 1 tablet by mouth at bedtime as needed.     memantine (NAMENDA) 10 MG tablet Take 1/2 tablet (5 mg) for 1 week at bedtime, then 1 tablet (10 mg) at bedtime. 27 tablet 0   No current facility-administered medications for this visit.    PHYSICAL EXAMINATION:  ECOG PERFORMANCE STATUS: 1/2 General appearance: Alert oriented and in no acute distress Head and neck: Asymmetric nasal swelling.  Had an episode of mild nosebleed during the appointment Lower extremities: Bilateral lower extremity edema 1+   LABORATORY DATA:  I have reviewed the data as listed Lab Results  Component Value Date   WBC 4.8 05/20/2022   HGB 10.5 (L) 05/20/2022   HCT 32.4 (L) 05/20/2022   MCV 72.6 (L) 05/20/2022   PLT 219 05/20/2022     Chemistry      Component Value Date/Time   NA 144 05/20/2022 0925   K 3.4 (L) 05/20/2022 0925   CL 107 05/20/2022 0925   CO2 29 05/20/2022 0925   BUN 15 05/20/2022 0925   CREATININE 0.48 05/20/2022 0925   CREATININE 0.70 11/26/2021 1502      Component Value  Date/Time   CALCIUM 9.4 05/20/2022 0925   ALKPHOS 103 05/20/2022 0925   AST 26 05/20/2022 0925   AST 16 11/26/2021 1502   ALT 28 05/20/2022 0925   ALT 15 11/26/2021 1502   BILITOT 0.4 05/20/2022 0925   BILITOT 0.4 11/26/2021 1502      RADIOGRAPHIC STUDIES: MR Brain W Wo Contrast  Result Date: 06/07/2022 CLINICAL DATA:  Headache, new or worsening. Diffuse large B-cell  lymphoma. EXAM: MRI HEAD WITHOUT AND WITH CONTRAST TECHNIQUE: Multiplanar, multiecho pulse sequences of the brain and surrounding structures were obtained without and with intravenous contrast. CONTRAST:  33m GADAVIST GADOBUTROL 1 MMOL/ML IV SOLN COMPARISON:  Head CT 11/20/2021 FINDINGS: Brain: No acute infarction, hemorrhage, hydrocephalus, extra-axial collection or mass lesion. Generalized brain atrophy. Mild for age chronic small vessel ischemia. Vascular: Major flow voids and vascular enhancements are preserved Skull and upper cervical spine: The covered cervical spine shows advanced disc and facet degeneration. C3-4 endplate T1 hypointensity appears degenerative. Sinuses/Orbits: Enhancing, diffusion hyperintense and T2 hypointense mass in the lower left nasal cavity measuring 18 x 8 mm on sagittal postcontrast imaging. Nasopharyngeal tumor is no longer present. IMPRESSION: 1. Mass with lymphoma type features in the lower left nasal cavity measuring 18 x 8 mm. 2. No evidence for intracranial tumor. Electronically Signed   By: JJorje GuildM.D.   On: 06/07/2022 14:51    All questions were answered. The patient knows to call the clinic with any problems, questions or concerns.  I spent 30 minutes in the care of this patient including reviewing plan of care, labs, discussion about follow-up and treatment plan.  We have discussed about MRI results, prognosis, treatment options role of palliative care and hospice.  PBenay PikeMD

## 2022-06-18 ENCOUNTER — Other Ambulatory Visit: Payer: Self-pay | Admitting: *Deleted

## 2022-06-18 ENCOUNTER — Telehealth: Payer: Self-pay | Admitting: *Deleted

## 2022-06-18 DIAGNOSIS — C8339 Diffuse large B-cell lymphoma, extranodal and solid organ sites: Secondary | ICD-10-CM

## 2022-06-18 NOTE — Telephone Encounter (Signed)
This RN contacted [facility] per MD and placed referral with Kyra with request to contact the pt's son with appt.  This RN informed the pt's son Jenny Reichmann of above.

## 2022-07-08 ENCOUNTER — Ambulatory Visit: Payer: Self-pay

## 2022-07-09 ENCOUNTER — Telehealth: Payer: Self-pay | Admitting: *Deleted

## 2022-07-09 NOTE — Patient Outreach (Signed)
  Care Coordination   Initial Visit Note   07/09/2022 Name: [name] MRN: [id number] DOB: 20-Nov-1932  [name] is a 88 y.o. year old female who sees [name] Contras, MD for primary care. I spoke with [name], son of  [name] by phone today.  What matters to the patients health and wellness today?  Pt is under Hospice care/support. Advised son pt is due for her Annual Wellness Visit with PCP- he plans to schedule with PCP.    Goals Addressed   None     SDOH assessments and interventions completed:  Yes     Care Coordination Interventions Activated:  No  Care Coordination Interventions:  No, not indicated   Follow up plan: No further intervention required.   Encounter Outcome:  Pt. Visit Completed   Eduard Clos MSW, LCSW Licensed Clinical Social Worker      [phone]

## 2022-07-10 ENCOUNTER — Encounter (HOSPITAL_COMMUNITY): Payer: Self-pay

## 2022-07-10 ENCOUNTER — Emergency Department (HOSPITAL_COMMUNITY)

## 2022-07-10 ENCOUNTER — Emergency Department (HOSPITAL_COMMUNITY)
Admission: EM | Admit: 2022-07-10 | Discharge: 2022-07-10 | Disposition: A | Discharge status: Home / Self Care | Attending: Emergency Medicine | Admitting: Emergency Medicine

## 2022-07-10 ENCOUNTER — Other Ambulatory Visit: Payer: Self-pay

## 2022-07-10 DIAGNOSIS — R82998 Other abnormal findings in urine: Secondary | ICD-10-CM | POA: Diagnosis not present

## 2022-07-10 DIAGNOSIS — Z7901 Long term (current) use of anticoagulants: Secondary | ICD-10-CM | POA: Diagnosis not present

## 2022-07-10 DIAGNOSIS — F0390 Unspecified dementia without behavioral disturbance: Secondary | ICD-10-CM | POA: Diagnosis not present

## 2022-07-10 DIAGNOSIS — M19012 Primary osteoarthritis, left shoulder: Secondary | ICD-10-CM | POA: Diagnosis not present

## 2022-07-10 DIAGNOSIS — M160 Bilateral primary osteoarthritis of hip: Secondary | ICD-10-CM | POA: Diagnosis not present

## 2022-07-10 DIAGNOSIS — Z043 Encounter for examination and observation following other accident: Secondary | ICD-10-CM | POA: Diagnosis not present

## 2022-07-10 DIAGNOSIS — M47816 Spondylosis without myelopathy or radiculopathy, lumbar region: Secondary | ICD-10-CM | POA: Diagnosis not present

## 2022-07-10 DIAGNOSIS — W19XXXA Unspecified fall, initial encounter: Secondary | ICD-10-CM | POA: Diagnosis not present

## 2022-07-10 DIAGNOSIS — W01198A Fall on same level from slipping, tripping and stumbling with subsequent striking against other object, initial encounter: Secondary | ICD-10-CM | POA: Insufficient documentation

## 2022-07-10 DIAGNOSIS — M19011 Primary osteoarthritis, right shoulder: Secondary | ICD-10-CM | POA: Diagnosis not present

## 2022-07-10 DIAGNOSIS — S0003XA Contusion of scalp, initial encounter: Secondary | ICD-10-CM | POA: Diagnosis not present

## 2022-07-10 DIAGNOSIS — Z79899 Other long term (current) drug therapy: Secondary | ICD-10-CM | POA: Insufficient documentation

## 2022-07-10 DIAGNOSIS — R350 Frequency of micturition: Secondary | ICD-10-CM | POA: Diagnosis not present

## 2022-07-10 DIAGNOSIS — S0990XA Unspecified injury of head, initial encounter: Secondary | ICD-10-CM | POA: Diagnosis present

## 2022-07-10 LAB — CBC WITH DIFFERENTIAL/PLATELET
Abs Immature Granulocytes: 0.01 10*3/uL (ref 0.00–0.07)
Basophils Absolute: 0 10*3/uL (ref 0.0–0.1)
Basophils Relative: 0 %
Eosinophils Absolute: 0.1 10*3/uL (ref 0.0–0.5)
Eosinophils Relative: 2 %
HCT: 29.9 % — ABNORMAL LOW (ref 36.0–46.0)
Hemoglobin: 9.3 g/dL — ABNORMAL LOW (ref 12.0–15.0)
Immature Granulocytes: 0 %
Lymphocytes Relative: 30 %
Lymphs Abs: 1.4 10*3/uL (ref 0.7–4.0)
MCH: 23.3 pg — ABNORMAL LOW (ref 26.0–34.0)
MCHC: 31.1 g/dL (ref 30.0–36.0)
MCV: 74.8 fL — ABNORMAL LOW (ref 80.0–100.0)
Monocytes Absolute: 0.4 10*3/uL (ref 0.1–1.0)
Monocytes Relative: 8 %
Neutro Abs: 2.7 10*3/uL (ref 1.7–7.7)
Neutrophils Relative %: 60 %
Platelets: 225 10*3/uL (ref 150–400)
RBC: 4 MIL/uL (ref 3.87–5.11)
RDW: 17 % — ABNORMAL HIGH (ref 11.5–15.5)
WBC: 4.5 10*3/uL (ref 4.0–10.5)
nRBC: 0 % (ref 0.0–0.2)

## 2022-07-10 LAB — URINALYSIS, ROUTINE W REFLEX MICROSCOPIC
Bilirubin Urine: NEGATIVE
Glucose, UA: NEGATIVE mg/dL
Hgb urine dipstick: NEGATIVE
Ketones, ur: NEGATIVE mg/dL
Nitrite: NEGATIVE
Protein, ur: NEGATIVE mg/dL
Specific Gravity, Urine: 1.024 (ref 1.005–1.030)
pH: 5 (ref 5.0–8.0)

## 2022-07-10 LAB — COMPREHENSIVE METABOLIC PANEL
ALT: 17 U/L (ref 0–44)
AST: 19 U/L (ref 15–41)
Albumin: 3.5 g/dL (ref 3.5–5.0)
Alkaline Phosphatase: 107 U/L (ref 38–126)
Anion gap: 4 — ABNORMAL LOW (ref 5–15)
BUN: 19 mg/dL (ref 8–23)
CO2: 28 mmol/L (ref 22–32)
Calcium: 9 mg/dL (ref 8.9–10.3)
Chloride: 111 mmol/L (ref 98–111)
Creatinine, Ser: 0.66 mg/dL (ref 0.44–1.00)
GFR, Estimated: >60 mL/min (ref >60)
Glucose, Bld: 91 mg/dL (ref 70–99)
Potassium: 3.8 mmol/L (ref 3.5–5.1)
Sodium: 143 mmol/L (ref 135–145)
Total Bilirubin: 0.2 mg/dL — ABNORMAL LOW (ref 0.3–1.2)
Total Protein: 6.4 g/dL — ABNORMAL LOW (ref 6.5–8.1)

## 2022-07-10 LAB — RAPID URINE DRUG SCREEN, HOSP PERFORMED
Amphetamines: NOT DETECTED
Barbiturates: NOT DETECTED
Benzodiazepines: POSITIVE — AB
Cocaine: NOT DETECTED
Opiates: POSITIVE — AB
Tetrahydrocannabinol: NOT DETECTED

## 2022-07-10 MED ORDER — SODIUM CHLORIDE 0.9 % IV BOLUS
500.0000 mL | Freq: Once | INTRAVENOUS | Status: AC
  Administered 2022-07-10: 500 mL via INTRAVENOUS

## 2022-07-10 NOTE — ED Provider Notes (Signed)
[facility] DEPT Provider Note   CSN: [id number] Arrival date & time: 07/10/22  2015     History  Chief Complaint  Patient presents with   Sandi Raveling I Suthers is a 88 y.o. female history of dementia, A-fib on Eliquis, here presenting with fall.  Patient apparently took Ativan before going to bed and became very groggy.  She fell backwards and hit the back of her head.  Patient gets frequent UTI and family request UA to rule out UTI.  The history is provided by the patient.       Home Medications Prior to Admission medications   Medication Sig Start Date End Date Taking? Authorizing Provider  acetaminophen (TYLENOL) 500 MG tablet Take 1,000 mg by mouth every 6 (six) hours as needed for mild pain or fever.    [provider]  allopurinol (ZYLOPRIM) 300 MG tablet Take 1 tablet (300 mg total) by mouth daily. 07/15/21   Benay Pike, MD  amLODipine (NORVASC) 10 MG tablet Take 10 mg by mouth daily.    [provider]  apixaban (ELIQUIS) 5 MG TABS tablet Take 1 tablet (5 mg total) by mouth 2 (two) times daily. 11/26/21   Benay Pike, MD  atorvastatin (LIPITOR) 80 MG tablet Take 80 mg by mouth every morning.    [provider]  ezetimibe (ZETIA) 10 MG tablet Take 10 mg by mouth every morning.    [provider]  lidocaine (XYLOCAINE) 2 % solution Patient: Mix 1part 2% viscous lidocaine, 1part H20. Swish & swallow 78m of diluted mixture, 323m before meals and at bedtime, up to QID 12/28/21   SqEppie GibsonMD  LORazepam (ATIVAN) 0.5 MG tablet Take 1 tablet by mouth at bedtime as needed.    [provider]  memantine (NAMENDA) 10 MG tablet Take 1/2 tablet (5 mg) for 1 week at bedtime, then 1 tablet (10 mg) at bedtime. 07/01/21   CaAlric RanMD  prochlorperazine (COMPAZINE) 10 MG tablet Take 1 tablet (10 mg total) by mouth every 6 (six) hours as needed (Nausea or vomiting). 05/29/21 11/26/21  IrBenay PikeMD       Allergies    Patient has no known allergies.    Review of Systems   Review of Systems  Psychiatric/Behavioral:  Positive for confusion.   All other systems reviewed and are negative.   Physical Exam Updated Vital Signs BP 127/67   Pulse 65   Temp 97.7 F (36.5 C)   Resp 11   SpO2 97%  Physical Exam Vitals and nursing note reviewed.  Constitutional:      Comments: Sleepy but arousable  HENT:     Head: Normocephalic.     Comments: Small posterior scalp hematoma    Nose: Nose normal.     Mouth/Throat:     Mouth: Mucous membranes are moist.  Eyes:     Extraocular Movements: Extraocular movements intact.     Pupils: Pupils are equal, round, and reactive to light.  Cardiovascular:     Rate and Rhythm: Normal rate and regular rhythm.     Pulses: Normal pulses.     Heart sounds: Normal heart sounds.  Pulmonary:     Effort: Pulmonary effort is normal.     Breath sounds: Normal breath sounds.  Abdominal:     General: Abdomen is flat.     Palpations: Abdomen is soft.  Musculoskeletal:        General: Normal range of motion.  Cervical back: Normal range of motion and neck supple.     Comments: No obvious spinal tenderness.  Normal range of motion bilateral hips  Skin:    General: Skin is warm.     Capillary Refill: Capillary refill takes less than 2 seconds.  Neurological:     Comments: Demented.  Patient has 4 out of 5 strength bilateral arms and legs.  Psychiatric:        Mood and Affect: Mood normal.     ED Results / Procedures / Treatments   Labs (all labs ordered are listed, but only abnormal results are displayed) Labs Reviewed  URINE CULTURE  CBC WITH DIFFERENTIAL/PLATELET  COMPREHENSIVE METABOLIC PANEL  URINALYSIS, ROUTINE W REFLEX MICROSCOPIC  RAPID URINE DRUG SCREEN, HOSP PERFORMED    EKG None  Radiology No results found.  Procedures Procedures    Medications Ordered in ED Medications  sodium chloride 0.9 % bolus 500 mL (500 mLs  Intravenous New Bag/Given 07/10/22 2113)    ED Course/ Medical Decision Making/ A&P                           Medical Decision Making CHRISTYL OSENTOSKI is a 88 y.o. female here presenting with sleepiness after taking lorazepam and fall.  We will need to get CT head and cervical spine to rule out bleed.  We will get an urinalysis as well.  10:32 PM I reviewed patient's labs and independently interpreted CT scans.  Labs were unremarkable and CT showed no fractures.  Patient's UA showed rare bacteria and trace leuks.  I discussed with the son and he states that her urine just smelled stronger but she has no complaints of dysuria.  Since there is no obvious UTI on UA, I sent off a urine culture and hold off antibiotics for now.  At this point, patient is stable for discharge  Problems Addressed: Fall, initial encounter: acute illness or injury  Amount and/or Complexity of Data Reviewed Labs: ordered. Decision-making details documented in ED Course. Radiology: ordered and independent interpretation performed. Decision-making details documented in ED Course.    Final Clinical Impression(s) / ED Diagnoses Final diagnoses:  None    Rx / DC Orders ED Discharge Orders     None         Drenda Freeze, MD 07/10/22 2235

## 2022-07-10 NOTE — Discharge Instructions (Signed)
You have no obvious fracture in your hip and your CT scan did not show any bleeding in your brain  Your urine did not show any obvious urinary tract infection.  We sent off a urine culture and you will be called if the urine culture is positive.  You are little sedated from Ativan and please avoid taking too much  See your doctor for follow-up  Return to ER if you have another fall, fever, dysuria, vomiting

## 2022-07-10 NOTE — ED Triage Notes (Addendum)
Pt. BIB [facility] for a fall. Pt. Took lorazepam before going to bed and started sundowning. When she got up, she fell and hit the back of her head. Pt. Has hx of dementia. A&O to baseline for EMS. No blood thinners or LOC. Pt. Family also wants pt. Evaluated for UTI.  EMS VS: BP: 118/64 HR: 68 O2: 98% CBG: 108

## 2022-07-12 LAB — URINE CULTURE: Culture: NO GROWTH

## 2022-08-06 ENCOUNTER — Encounter: Payer: Self-pay | Admitting: Hematology and Oncology

## 2023-03-23 DEATH — deceased

## 2023-05-24 ENCOUNTER — Encounter: Payer: Self-pay | Admitting: Hematology and Oncology

## 2023-05-24 ENCOUNTER — Ambulatory Visit: Payer: Medicare HMO | Admitting: Internal Medicine

## 2023-05-31 ENCOUNTER — Ambulatory Visit: Payer: Medicare HMO | Admitting: Internal Medicine
